# Patient Record
Sex: Female | Born: 2000
Health system: Southern US, Community
[De-identification: ages and names within clinical notes are randomized; demographics above are authoritative.]

## PROBLEM LIST (undated history)

## (undated) ENCOUNTER — Inpatient Hospital Stay (HOSPITAL_COMMUNITY): Payer: Self-pay

## (undated) DIAGNOSIS — Z6791 Unspecified blood type, Rh negative: Secondary | ICD-10-CM

## (undated) DIAGNOSIS — O24419 Gestational diabetes mellitus in pregnancy, unspecified control: Secondary | ICD-10-CM

## (undated) DIAGNOSIS — I2699 Other pulmonary embolism without acute cor pulmonale: Secondary | ICD-10-CM

## (undated) DIAGNOSIS — O133 Gestational [pregnancy-induced] hypertension without significant proteinuria, third trimester: Secondary | ICD-10-CM

## (undated) DIAGNOSIS — O139 Gestational [pregnancy-induced] hypertension without significant proteinuria, unspecified trimester: Secondary | ICD-10-CM

## (undated) DIAGNOSIS — D696 Thrombocytopenia, unspecified: Secondary | ICD-10-CM

## (undated) DIAGNOSIS — O99820 Streptococcus B carrier state complicating pregnancy: Secondary | ICD-10-CM

## (undated) DIAGNOSIS — I517 Cardiomegaly: Secondary | ICD-10-CM

## (undated) HISTORY — PX: KELOID EXCISION: SHX1856

## (undated) HISTORY — DX: Cardiomegaly: I51.7

---
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
## 2018-01-11 ENCOUNTER — Encounter: Payer: Self-pay | Admitting: Hematology and Oncology

## 2018-01-11 ENCOUNTER — Inpatient Hospital Stay: Payer: Medicaid Other | Attending: Hematology and Oncology | Admitting: Hematology and Oncology

## 2018-01-11 ENCOUNTER — Inpatient Hospital Stay: Payer: Medicaid Other

## 2018-01-11 ENCOUNTER — Telehealth: Payer: Self-pay | Admitting: Hematology and Oncology

## 2018-01-11 DIAGNOSIS — Z79899 Other long term (current) drug therapy: Secondary | ICD-10-CM | POA: Diagnosis not present

## 2018-01-11 DIAGNOSIS — Z7901 Long term (current) use of anticoagulants: Secondary | ICD-10-CM

## 2018-01-11 DIAGNOSIS — D509 Iron deficiency anemia, unspecified: Secondary | ICD-10-CM | POA: Diagnosis not present

## 2018-01-11 DIAGNOSIS — I2699 Other pulmonary embolism without acute cor pulmonale: Secondary | ICD-10-CM | POA: Diagnosis not present

## 2018-01-11 DIAGNOSIS — N920 Excessive and frequent menstruation with regular cycle: Secondary | ICD-10-CM

## 2018-01-11 LAB — CBC WITH DIFFERENTIAL (CANCER CENTER ONLY)
BASOS PCT: 1 %
Basophils Absolute: 0.1 10*3/uL (ref 0.0–0.1)
EOS ABS: 0.2 10*3/uL (ref 0.0–0.5)
Eosinophils Relative: 2 %
HEMATOCRIT: 34.8 % (ref 34.8–46.6)
Hemoglobin: 11.4 g/dL — ABNORMAL LOW (ref 11.6–15.9)
Lymphocytes Relative: 24 %
Lymphs Abs: 2.3 10*3/uL (ref 0.9–3.3)
MCH: 27.7 pg (ref 25.1–34.0)
MCHC: 32.7 g/dL (ref 31.5–36.0)
MCV: 84.7 fL (ref 79.5–101.0)
MONO ABS: 0.6 10*3/uL (ref 0.1–0.9)
MONOS PCT: 6 %
NEUTROS ABS: 6.5 10*3/uL (ref 1.5–6.5)
Neutrophils Relative %: 67 %
Platelet Count: 298 10*3/uL (ref 145–400)
RBC: 4.11 MIL/uL (ref 3.70–5.45)
RDW: 15.2 % — ABNORMAL HIGH (ref 11.2–14.5)
WBC Count: 9.5 10*3/uL (ref 3.9–10.3)

## 2018-01-11 LAB — IRON AND TIBC
Iron: 43 ug/dL (ref 41–142)
Saturation Ratios: 12 % — ABNORMAL LOW (ref 21–57)
TIBC: 363 ug/dL (ref 236–444)
UIBC: 320 ug/dL

## 2018-01-11 LAB — CMP (CANCER CENTER ONLY)
ALBUMIN: 3.7 g/dL (ref 3.5–5.0)
ALK PHOS: 73 U/L (ref 40–150)
ALT: 19 U/L (ref 0–55)
AST: 18 U/L (ref 5–34)
Anion gap: 8 (ref 3–11)
BUN: 12 mg/dL (ref 7–26)
CALCIUM: 9.2 mg/dL (ref 8.4–10.4)
CO2: 26 mmol/L (ref 22–29)
CREATININE: 0.82 mg/dL (ref 0.60–1.10)
Chloride: 106 mmol/L (ref 98–109)
GFR, Est AFR Am: >60 mL/min (ref >60)
GFR, Estimated: >60 mL/min (ref >60)
GLUCOSE: 80 mg/dL (ref 70–140)
Potassium: 4.3 mmol/L (ref 3.5–5.1)
SODIUM: 140 mmol/L (ref 136–145)
Total Bilirubin: <0.2 mg/dL — ABNORMAL LOW (ref 0.2–1.2)
Total Protein: 7.9 g/dL (ref 6.4–8.3)

## 2018-01-11 LAB — FERRITIN: Ferritin: 13 ng/mL (ref 9–269)

## 2018-01-11 NOTE — Telephone Encounter (Signed)
Scheduled appt per 2/6 los - gave patient aVS and calender per los.

## 2018-01-27 ENCOUNTER — Emergency Department (HOSPITAL_COMMUNITY): Payer: Medicaid Other

## 2018-01-27 ENCOUNTER — Encounter (HOSPITAL_COMMUNITY): Payer: Self-pay | Admitting: Emergency Medicine

## 2018-01-27 DIAGNOSIS — Z86711 Personal history of pulmonary embolism: Secondary | ICD-10-CM | POA: Diagnosis not present

## 2018-01-27 DIAGNOSIS — R1084 Generalized abdominal pain: Secondary | ICD-10-CM | POA: Diagnosis not present

## 2018-01-27 DIAGNOSIS — Z7901 Long term (current) use of anticoagulants: Secondary | ICD-10-CM | POA: Insufficient documentation

## 2018-01-27 DIAGNOSIS — R0789 Other chest pain: Secondary | ICD-10-CM | POA: Insufficient documentation

## 2018-01-27 LAB — COMPREHENSIVE METABOLIC PANEL
ALT: 15 U/L (ref 14–54)
AST: 20 U/L (ref 15–41)
Albumin: 3.9 g/dL (ref 3.5–5.0)
Alkaline Phosphatase: 66 U/L (ref 38–126)
Anion gap: 10 (ref 5–15)
BUN: 14 mg/dL (ref 6–20)
CO2: 25 mmol/L (ref 22–32)
Calcium: 9.1 mg/dL (ref 8.9–10.3)
Chloride: 104 mmol/L (ref 101–111)
Creatinine, Ser: 0.87 mg/dL (ref 0.44–1.00)
GFR calc Af Amer: >60 mL/min (ref >60)
GFR calc non Af Amer: >60 mL/min (ref >60)
Glucose, Bld: 83 mg/dL (ref 65–99)
Potassium: 3.7 mmol/L (ref 3.5–5.1)
Sodium: 139 mmol/L (ref 135–145)
Total Bilirubin: 0.5 mg/dL (ref 0.3–1.2)
Total Protein: 7.9 g/dL (ref 6.5–8.1)

## 2018-01-27 LAB — I-STAT BETA HCG BLOOD, ED (MC, WL, AP ONLY): I-stat hCG, quantitative: <5 m[IU]/mL (ref <5)

## 2018-01-27 LAB — CBC
HCT: 35.1 % — ABNORMAL LOW (ref 36.0–46.0)
Hemoglobin: 11.5 g/dL — ABNORMAL LOW (ref 12.0–15.0)
MCH: 27.9 pg (ref 26.0–34.0)
MCHC: 32.8 g/dL (ref 30.0–36.0)
MCV: 85.2 fL (ref 78.0–100.0)
Platelets: 314 10*3/uL (ref 150–400)
RBC: 4.12 MIL/uL (ref 3.87–5.11)
RDW: 14.9 % (ref 11.5–15.5)
WBC: 11.9 10*3/uL — ABNORMAL HIGH (ref 4.0–10.5)

## 2018-01-27 LAB — I-STAT TROPONIN, ED: Troponin i, poc: 0 ng/mL (ref 0.00–0.08)

## 2018-01-27 LAB — LIPASE, BLOOD: LIPASE: 29 U/L (ref 11–51)

## 2018-01-27 NOTE — ED Triage Notes (Signed)
Patient presents ambulatory stating a few weeks ago she was throwing up with hot flashes and back pain. States shes had intermittent abdominal pain/bloating for a week now associated with diarrhea and rectal bleeding. Patient states she has left sided chest pain x 3 days ago.

## 2018-01-28 ENCOUNTER — Emergency Department (HOSPITAL_COMMUNITY)
Admission: EM | Admit: 2018-01-28 | Discharge: 2018-01-28 | Disposition: A | Discharge status: Home / Self Care | Payer: Medicaid Other | Attending: Emergency Medicine | Admitting: Emergency Medicine

## 2018-01-28 DIAGNOSIS — R1084 Generalized abdominal pain: Secondary | ICD-10-CM

## 2018-01-28 DIAGNOSIS — R0789 Other chest pain: Secondary | ICD-10-CM

## 2018-01-28 LAB — URINALYSIS, ROUTINE W REFLEX MICROSCOPIC
Bilirubin Urine: NEGATIVE
Glucose, UA: NEGATIVE mg/dL
HGB URINE DIPSTICK: NEGATIVE
Ketones, ur: 5 mg/dL — AB
LEUKOCYTES UA: NEGATIVE
NITRITE: NEGATIVE
PROTEIN: NEGATIVE mg/dL
SPECIFIC GRAVITY, URINE: 1.033 — AB (ref 1.005–1.030)
pH: 5 (ref 5.0–8.0)

## 2018-01-28 LAB — POC OCCULT BLOOD, ED: FECAL OCCULT BLD: NEGATIVE

## 2018-01-28 MED ORDER — DICYCLOMINE HCL 10 MG PO CAPS
20.0000 mg | ORAL_CAPSULE | Freq: Once | ORAL | Status: AC
  Administered 2018-01-28: 20 mg via ORAL
  Filled 2018-01-28: qty 2

## 2018-01-28 MED ORDER — DICYCLOMINE HCL 20 MG PO TABS: 20.0000 mg | ORAL_TABLET | Freq: Three times a day (TID) | ORAL | 0 refills | Status: DC

## 2018-01-28 MED ORDER — ONDANSETRON 4 MG PO TBDP: 4.0000 mg | ORAL_TABLET | Freq: Four times a day (QID) | ORAL | 0 refills | Status: DC | PRN

## 2018-01-28 NOTE — ED Provider Notes (Signed)
TIME SEEN: 2:20 AM  CHIEF COMPLAINT: Multiple complaints  HPI: Patient is a [age] year old female who presents to the emergency department with multiple complaints.  Patient has a history of PE and is on Xarelto.  Thought to be secondary to her birth control which she is no longer taking.  Comes in today complaining of 3 days of left-sided chest pain.  Described as dull in nature and does not feel like her previous PE.  Now completely gone.  No shortness of breath.  Also complaining of diffuse abdominal pain that "moves around" for a week and a half with diarrhea that occurs every morning with some bright red blood per rectum.  No melena.  Has had nausea and one episode of vomiting.  No fevers, chills, cough.  No previous abdominal surgery.  No sick contact.  No recent travel.  Was hospitalized in December for pneumonia.  Other than that no recent hospitalization or antibiotic use.  ROS: See HPI Constitutional: no fever  Eyes: no drainage  ENT: no runny nose   Cardiovascular:  no chest pain  Resp: no SOB  GI: no vomiting GU: no dysuria Integumentary: no rash  Allergy: no hives  Musculoskeletal: no leg swelling  Neurological: no slurred speech ROS otherwise negative  PAST MEDICAL HISTORY/PAST SURGICAL HISTORY:  Past Medical History:  Diagnosis Date  . Pulmonary embolism (HCC)    a. Dx 09/2017, had IUD removed in case this contributed.  . Right ventricular dilation    a. at time of PE 09/2017.    MEDICATIONS:  Prior to Admission medications   Medication Sig Start Date End Date Taking? Authorizing Provider  rivaroxaban (XARELTO) 20 MG TABS tablet Take 1 tablet (20 mg total) by mouth daily with supper. Start after the initial starter pack is completed 09/19/17  Yes Rai, Ripudeep K, MD  docusate sodium (COLACE) 100 MG capsule Take 1 capsule (100 mg total) by mouth daily. Patient not taking: Reported on 12/18/2017 11/10/17 11/10/18  Arrien, York RamMauricio Daniel, MD  ferrous sulfate (FERROUSUL) 325  (65 FE) MG tablet Take 1 tablet (325 mg total) by mouth 3 (three) times daily with meals. Patient not taking: Reported on 12/18/2017 11/10/17 12/10/17  Arrien, York RamMauricio Daniel, MD    ALLERGIES:  No Known Allergies  SOCIAL HISTORY:  Social History   Tobacco Use  . Smoking status: Never Smoker  . Smokeless tobacco: Never Used  Substance Use Topics  . Alcohol use: No    FAMILY HISTORY: Family History  Problem Relation Age of Onset  . Hypertension Mother   . Hypertension Maternal Aunt   . Cancer Maternal Grandfather   . Pulmonary embolism Neg Hx   . Heart disease Neg Hx     EXAM: BP 132/80 (BP Location: Right Arm)   Pulse 92   Temp 98.4 F (36.9 C) (Oral)   Resp 14   Ht 5\' 4"  (1.626 m)   Wt 83.9 kg (185 lb)   LMP 12/18/2017   SpO2 92%   BMI 31.76 kg/m  CONSTITUTIONAL: Alert and oriented and responds appropriately to questions. Well-appearing; well-nourished HEAD: Normocephalic EYES: Conjunctivae clear, pupils appear equal, EOMI ENT: normal nose; moist mucous membranes NECK: Supple, no meningismus, no nuchal rigidity, no LAD  CARD: RRR; S1 and S2 appreciated; no murmurs, no clicks, no rubs, no gallops RESP: Normal chest excursion without splinting or tachypnea; breath sounds clear and equal bilaterally; no wheezes, no rhonchi, no rales, no hypoxia or respiratory distress, speaking full sentences ABD/GI: Normal bowel sounds; non-distended;  soft, non-tender, no rebound, no guarding, no peritoneal signs, no hepatosplenomegaly RECTAL:  Normal rectal tone, no gross blood or melena, guaiac negative, no hemorrhoids appreciated, nontender rectal exam, no fecal impaction BACK:  The back appears normal and is non-tender to palpation, there is no CVA tenderness EXT: Normal ROM in all joints; non-tender to palpation; no edema; normal capillary refill; no cyanosis, no calf tenderness or swelling    SKIN: Normal color for age and race; warm; no rash NEURO: Moves all extremities  equally PSYCH: The patient's mood and manner are appropriate. Grooming and personal hygiene are appropriate.  MEDICAL DECISION MAKING: Patient here with complaints of atypical chest pain.  Doubt PE, ACS or dissection.  Now currently having no chest pain.  Troponin obtained in triage is negative.  EKG shows no ischemic abnormality, arrhythmia or interval abnormality.  Chest x-ray is clear.  Reports compliance with her Xarelto.  Also complaining of abdominal pain.  Her abdominal exam is benign.  Requesting something for pain.  Will give Bentyl.  Normal labs including no significant leukocytosis, normal LFTs and lipase, normal renal function.  Pregnancy test is negative.  Urine shows no sign of infection.  Doubt appendicitis, colitis, bowel obstruction, diverticulitis, pancreatitis, cholecystitis.  I do not feel she needs abdominal imaging.  She is guaiac negative today.  I do not feel patient needs further emergent workup.  I feel she can be treated as an outpatient and follow-up with her primary care physician.   At this time, I do not feel there is any life-threatening condition present. I have reviewed and discussed all results (EKG, imaging, lab, urine as appropriate) and exam findings with patient/family. I have reviewed nursing notes and appropriate previous records.  I feel the patient is safe to be discharged home without further emergent workup and can continue workup as an outpatient as needed. Discussed usual and customary return precautions. Patient/family verbalize understanding and are comfortable with this plan.  Outpatient follow-up has been provided if needed. All questions have been answered.      EKG Interpretation  Date/Time:  Friday January 27 2018 22:04:38 EST Ventricular Rate:  99 PR Interval:    QRS Duration: 92 QT Interval:  328 QTC Calculation: 421 R Axis:   86 Text Interpretation:  Sinus rhythm No significant change since last tracing other than rate is slower compared  to previous  Confirmed by Nyeem Stoke, Baxter Hire [phone]) on 01/28/2018 1:11:15 AM         Larrie Lucia, Layla Maw, DO 01/28/18 6045

## 2018-01-28 NOTE — Discharge Instructions (Signed)
Your labs, urine, chest x-ray, EKG today were normal.  Please follow-up with your primary care physician if symptoms continue.  You may take over-the-counter Imodium as needed for diarrhea.  Your stool had no blood in it today in the ED.  You may take Tylenol 1000 mg every 6 hours as needed for pain.

## 2018-01-28 NOTE — ED Notes (Signed)
Pt walked to bathroom to give urine specimen.

## 2018-02-05 NOTE — Assessment & Plan Note (Signed)
17 y.o. female with seemingly unprovoked initial event of venous thromboembolism in September-October 2018 with low volume clot, but in the context of troponin leak and possible right ventricular strain.  Subsequent imaging demonstrated resolution of thrombus in the lungs on the current anticoagulation therapy with rivaroxaban.  Patient's symptoms are gradually improving.  Plan: -Additional lab work as outlined below.  Although it is the initial thrombotic event, patient is very healthy and I do believe that thrombophilia workup is indicated at this time to help us determine the recommended duration of anticoagulation. -Unless positive results are found on thrombophilia workup, recommend 6 months of therapeutic anticoagulation to complete in mid April 2019. -Return to my clinic in 3 months with labs and clinic visit.

## 2018-02-05 NOTE — Progress Notes (Signed)
[facility] New Visit:  Assessment: Pulmonary embolism [facility]) 17 y.o. female with seemingly unprovoked initial event of venous thromboembolism in September-October 2018 with low volume clot, but in the context of troponin leak and possible right ventricular strain.  Subsequent imaging demonstrated resolution of thrombus in the lungs on the current anticoagulation therapy with rivaroxaban.  Patient's symptoms are gradually improving.  Plan: -Additional lab work as outlined below.  Although it is the initial thrombotic event, patient is very healthy and I do believe that thrombophilia workup is indicated at this time to help [place] determine the recommended duration of anticoagulation. -Unless positive results are found on thrombophilia workup, recommend 6 months of therapeutic anticoagulation to complete in mid April 2019. -Return to my clinic in 3 months with labs and clinic visit.   Voice recognition software was used and creation of this note. Despite my best effort at editing the text, some misspelling/errors may have occurred.  Orders Placed This Encounter  Procedures  . CBC with Differential (Cancer Center Only)    Standing Status:   Future    Number of Occurrences:   1    Standing Expiration Date:   01/11/2019  . CMP ([facility] only)    Standing Status:   Future    Number of Occurrences:   1    Standing Expiration Date:   01/11/2019  . Iron and TIBC    Standing Status:   Future    Number of Occurrences:   1    Standing Expiration Date:   01/11/2019  . Ferritin    Standing Status:   Future    Number of Occurrences:   1    Standing Expiration Date:   01/11/2019  . CBC with Differential (Cancer Center Only)    Standing Status:   Future    Standing Expiration Date:   01/11/2019  . CMP ([facility] only)    Standing Status:   Future    Standing Expiration Date:   01/11/2019  . Antithrombin III    Standing Status:   Future    Standing Expiration Date:   01/11/2019   . Protein C activity    Standing Status:   Future    Standing Expiration Date:   01/11/2019  . Protein C, total    Standing Status:   Future    Standing Expiration Date:   01/11/2019  . Protein S activity    Standing Status:   Future    Standing Expiration Date:   01/11/2019  . Protein S, total    Standing Status:   Future    Standing Expiration Date:   01/11/2019  . Lupus anticoagulant panel    Standing Status:   Future    Standing Expiration Date:   01/11/2019  . Beta-2-glycoprotein i abs, IgG/M/A    Standing Status:   Future    Standing Expiration Date:   01/11/2019  . Homocysteine, serum    Standing Status:   Future    Standing Expiration Date:   01/11/2019  . Factor 5 leiden    Standing Status:   Future    Standing Expiration Date:   01/11/2019  . Prothrombin gene mutation    Standing Status:   Future    Standing Expiration Date:   01/11/2019  . Cardiolipin antibodies, IgG, IgM, IgA    Standing Status:   Future    Standing Expiration Date:   01/11/2019    All questions were answered.  . The patient knows  to call the clinic with any problems, questions or concerns.  This note was electronically signed.    History of Presenting Illness Alexis Richardson 17 y.o. presenting to the [facility] for history of venous thromboembolism, referred byDr Rapudeep Rai & Dr Troy Sine.  Patient was diagnosed with pulmonary embolism on 09/17/17 as outlined below, but the initial presentation of the symptoms was likely slightly earlier at the end of September considering the presentation of the left chest pain palpitations at that time.  The timeline of that evaluation as outlined below in hematological history.  Following initial diagnosis, patient has been on therapeutic anticoagulation and reports good compliance with therapy.  Prior to the development of the clot, patient was on intrauterine device with levonorgestrel.  She denies any preceding long-range travel, lower extremity injury, period of  immobilization, or recent surgery.  Following initial event, patient had multiple ER visits with various complaints including epigastric pain and chest pain on 09/22/17, left lower extremity pain at the knee and ankle on 10/05/17 at which time she also had a menstrual period which was heavier than normal due to patient being on therapeutic anticoagulation with rivaroxaban.  She again presented to the emergency room with chest pain abdominal pain and recurrent vaginal bleeding 10/17/17, subsequent to presenting on 10/22/17 with cough, chest pain, and low-grade fevers.  Again on 12/, with cough, chills, and progressive fever.  03/18 at that time patient was admitted for 3 days due to a diagnosis of multifocal pneumonia and was also diagnosed with iron deficiency anemia.  At this time, patient is feeling a bit better.  The respiratory symptoms have subsided.  She denies any active chest pain, or shortness of breath.  No active abdominal pain, nausea, vomiting, hematochezia, or melena.  Patient has no significant past medical history.  Patient has no family history of clotting disorders in the family, no history of recurrent spontaneous abortions or difficulty conceiving.  Patient does not smoke, does not drink alcohol, is sexually active with the same partner for the past 2 years, not using barrier protection.  Oncological/hematological History: **VTE, unprovoked --Event #1, 09/03/17: Patient initially presented to the emergency room with pain in the left chest and palpitations.  Was managed conservatively and discharged home, nevertheless, presented on 09/17/14 with shortness of breath following sudden onset of chest pain 2 hours prior with associated chest tightness and pressure worsening with activity and without pleuritic component.  Lab work was positive for mildly elevated troponin  --CTA Chest, 09/17/17: Small nonocclusive pulmonary embolism in the right upper lobe, no right ventricular strain.  --ECHO,  10/normal left ventricular ejection fraction of 55-60%.13/18: Right ventricle with mild dilation.  --Doppler US BL LE, 09/18/17: Negative for deep vein thrombosis.  --Treatment:    --Heparin gtt   --Rivaroxaban, 09/20/17- --Labs, 10/05/17: WBC   9.3, Hgb 12.2, MCV 86.2, MCH 28.6, MCHC 33.2, RDW 14.3, Plt 294; --CTA Chest, 11/07/17: Interval resolution of the pulmonary embolus. --Labs, 11/09/17: WBC 12.9, Hgb 10.6, MCV 85.0, MCH 28.0, MCHC 32.9, RDW 13.5, Plt 297; Fe 14, FeSat 4%, TIBC 332, Ferritin 57 --ECHO, 11/23/17: Left ventricular ejection fraction 65-70%, right ventricle normal cavity size, IVC of normal caliber.  Medical History: Past Medical History:  Diagnosis Date  . Pulmonary embolism ([place])    a. Dx 09/2017, had IUD removed in case this contributed.  . Right ventricular dilation    a. at time of PE 09/2017.    Surgical History: No past surgical history on file.  Family History: Family History  Problem Relation Age of Onset  . Hypertension Mother   . Hypertension Maternal Aunt   . Cancer Maternal Grandfather   . Pulmonary embolism Neg Hx   . Heart disease Neg Hx     Social History: Social History   Socioeconomic History  . Marital status: Single    Spouse name: Not on file  . Number of children: Not on file  . Years of education: Not on file  . Highest education level: Not on file  Social Needs  . Financial resource strain: Not on file  . Food insecurity - worry: Not on file  . Food insecurity - inability: Not on file  . Transportation needs - medical: Not on file  . Transportation needs - non-medical: Not on file  Occupational History  . Not on file  Tobacco Use  . Smoking status: Never Smoker  . Smokeless tobacco: Never Used  Substance and Sexual Activity  . Alcohol use: No  . Drug use: Yes    Types: Marijuana    Comment: last mid-Oct  . Sexual activity: Yes    Birth control/protection: None  Other Topics Concern  . Not on file  Social History  Narrative  . Not on file    Allergies: No Known Allergies  Medications:  Current Outpatient Medications  Medication Sig Dispense Refill  . rivaroxaban (XARELTO) 20 MG TABS tablet Take 1 tablet (20 mg total) by mouth daily with supper. Start after the initial starter pack is completed 30 tablet 4  . dicyclomine (BENTYL) 20 MG tablet Take 1 tablet (20 mg total) by mouth 3 (three) times daily before meals. As needed for abdominal cramping 15 tablet 0  . docusate sodium (COLACE) 100 MG capsule Take 1 capsule (100 mg total) by mouth daily. (Patient not taking: Reported on 12/18/2017) 30 capsule 0  . ferrous sulfate (FERROUSUL) 325 (65 FE) MG tablet Take 1 tablet (325 mg total) by mouth 3 (three) times daily with meals. (Patient not taking: Reported on 12/18/2017) 90 tablet 0  . ondansetron (ZOFRAN ODT) 4 MG disintegrating tablet Take 1 tablet (4 mg total) by mouth every 6 (six) hours as needed for nausea or vomiting. 20 tablet 0   No current facility-administered medications for this visit.     Review of Systems: Review of Systems  All other systems reviewed and are negative.    PHYSICAL EXAMINATION Last menstrual period 12/18/2017.  ECOG PERFORMANCE STATUS: 1 - Symptomatic but completely ambulatory  Physical Exam  Constitutional: She is oriented to person, place, and time and well-developed, well-nourished, and in no distress. No distress.  HENT:  Head: Normocephalic and atraumatic.  Mouth/Throat: Oropharynx is clear and moist. No oropharyngeal exudate.  Eyes: Conjunctivae and EOM are normal. Pupils are equal, round, and reactive to light. No scleral icterus.  Neck: No thyromegaly present.  Cardiovascular: Normal rate, regular rhythm and normal heart sounds.  No murmur heard. Pulmonary/Chest: Effort normal and breath sounds normal. No respiratory distress. She has no wheezes. She has no rales.  Abdominal: Soft. Bowel sounds are normal. She exhibits no distension and no mass. There  is no tenderness. There is no guarding.  Musculoskeletal: She exhibits no edema.  Lymphadenopathy:    She has no cervical adenopathy.  Neurological: She is alert and oriented to person, place, and time. She has normal reflexes. No cranial nerve deficit. Coordination normal.  Skin: Skin is warm and dry. No rash noted. She is not diaphoretic. No erythema. No pallor.  LABORATORY DATA: I have personally reviewed the data as listed: Appointment on 01/11/2018  Component Date Value Ref Range Status  . Ferritin 01/11/2018 13  9 - 269 ng/mL Final   Performed at [facility], [place] [address] [place], [place] [address]  . Iron 01/11/2018 43  41 - 142 ug/dL Final  . TIBC 01/11/2018 363  236 - 444 ug/dL Final  . Saturation Ratios 01/11/2018 12* 21 - 57 % Final  . UIBC 01/11/2018 320  ug/dL Final   Performed at [facility], [place] [address], [place], [place] [address]  . Sodium 01/11/2018 140  136 - 145 mmol/L Final  . Potassium 01/11/2018 4.3  3.5 - 5.1 mmol/L Final  . Chloride 01/11/2018 106  98 - 109 mmol/L Final  . CO2 01/11/2018 26  22 - 29 mmol/L Final  . Glucose, Bld 01/11/2018 80  70 - 140 mg/dL Final  . BUN 01/11/2018 12  7 - 26 mg/dL Final  . Creatinine 01/11/2018 0.82  0.60 - 1.10 mg/dL Final  . Calcium 01/11/2018 9.2  8.4 - 10.4 mg/dL Final  . Total Protein 01/11/2018 7.9  6.4 - 8.3 g/dL Final  . Albumin 01/11/2018 3.7  3.5 - 5.0 g/dL Final  . AST 01/11/2018 18  5 - 34 U/L Final  . ALT 01/11/2018 19  0 - 55 U/L Final  . Alkaline Phosphatase 01/11/2018 73  40 - 150 U/L Final  . Total Bilirubin 01/11/2018 <0.2* 0.2 - 1.2 mg/dL Final  . GFR, Est Non Af Am 01/11/2018 >60  >60 mL/min Final  . GFR, Est AFR Am 01/11/2018 >60  >60 mL/min Final   Comment: (NOTE) The eGFR has been calculated using the CKD EPI equation. This calculation has not been validated in all clinical situations. eGFR's persistently <60 mL/min signify possible  Chronic Kidney Disease.   Georgiann Hahn gap 01/11/2018 8  3 - 11 Final   Performed at [facility], [place] [address], [place], [place] [address]  . WBC Count 01/11/2018 9.5  3.9 - 10.3 K/uL Final  . RBC 01/11/2018 4.11  3.70 - 5.45 MIL/uL Final  . Hemoglobin 01/11/2018 11.4* 11.6 - 15.9 g/dL Final  . HCT 01/11/2018 34.8  34.8 - 46.6 % Final  . MCV 01/11/2018 84.7  79.5 - 101.0 fL Final  . MCH 01/11/2018 27.7  25.1 - 34.0 pg Final  . MCHC 01/11/2018 32.7  31.5 - 36.0 g/dL Final  . RDW 01/11/2018 15.2* 11.2 - 14.5 % Final  . Platelet Count 01/11/2018 298  145 - 400 K/uL Final  . Neutrophils Relative % 01/11/2018 67  % Final  . Neutro Abs 01/11/2018 6.5  1.5 - 6.5 K/uL Final  . Lymphocytes Relative 01/11/2018 24  % Final  . Lymphs Abs 01/11/2018 2.3  0.9 - 3.3 K/uL Final  . Monocytes Relative 01/11/2018 6  % Final  . Monocytes Absolute 01/11/2018 0.6  0.1 - 0.9 K/uL Final  . Eosinophils Relative 01/11/2018 2  % Final  . Eosinophils Absolute 01/11/2018 0.2  0.0 - 0.5 K/uL Final  . Basophils Relative 01/11/2018 1  % Final  . Basophils Absolute 01/11/2018 0.1  0.0 - 0.1 K/uL Final   Performed at [facility], [place] [address], [place], [place] [address]         Ardath Sax, MD

## 2018-03-27 ENCOUNTER — Encounter: Payer: Self-pay | Admitting: Podiatry

## 2018-03-27 ENCOUNTER — Ambulatory Visit (INDEPENDENT_AMBULATORY_CARE_PROVIDER_SITE_OTHER): Payer: Medicaid Other | Admitting: Podiatry

## 2018-03-27 VITALS — BP 123/83 | HR 79 | Resp 16

## 2018-03-27 DIAGNOSIS — M21622 Bunionette of left foot: Secondary | ICD-10-CM | POA: Diagnosis not present

## 2018-03-27 DIAGNOSIS — M7752 Other enthesopathy of left foot: Secondary | ICD-10-CM

## 2018-03-27 DIAGNOSIS — M779 Enthesopathy, unspecified: Secondary | ICD-10-CM

## 2018-03-27 DIAGNOSIS — L84 Corns and callosities: Secondary | ICD-10-CM

## 2018-03-27 MED ORDER — TRIAMCINOLONE ACETONIDE 10 MG/ML IJ SUSP
10.0000 mg | Freq: Once | INTRAMUSCULAR | Status: AC
  Administered 2018-03-27: 10 mg

## 2018-03-28 NOTE — Progress Notes (Signed)
Subjective:   Patient ID: Alexis NordmannAmy Richardson, female   DOB: 17 y.o.   MRN: [id number]   HPI Patient presents with x-rays stating she has this painful callus on the fifth metatarsal left foot that makes it hard to walk and is been present for around 6 months.  Patient does not smoke likes to be active   Review of Systems  All other systems reviewed and are negative.       Objective:  Physical Exam  Constitutional: She appears well-developed and well-nourished.  Cardiovascular: Intact distal pulses.  Pulmonary/Chest: Effort normal.  Musculoskeletal: Normal range of motion.  Neurological: She is alert.  Skin: Skin is warm.  Nursing note and vitals reviewed.   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  Today I did a careful capsular injection of the subcapsular area to reduce inflammation under sterile technique debrided the lesion and will see how much relief this gives her

## 2018-04-12 ENCOUNTER — Ambulatory Visit: Payer: Medicaid Other | Admitting: Cardiovascular Disease

## 2018-04-12 ENCOUNTER — Encounter: Payer: Self-pay | Admitting: Cardiovascular Disease

## 2018-04-12 ENCOUNTER — Inpatient Hospital Stay: Payer: Medicaid Other | Attending: Hematology and Oncology

## 2018-04-12 VITALS — BP 114/81 | HR 75 | Ht 63.5 in | Wt 202.0 lb

## 2018-04-12 DIAGNOSIS — Z920 Personal history of contraception: Secondary | ICD-10-CM | POA: Diagnosis not present

## 2018-04-12 DIAGNOSIS — Z86711 Personal history of pulmonary embolism: Secondary | ICD-10-CM | POA: Insufficient documentation

## 2018-04-12 DIAGNOSIS — Z7901 Long term (current) use of anticoagulants: Secondary | ICD-10-CM | POA: Diagnosis not present

## 2018-04-12 DIAGNOSIS — D509 Iron deficiency anemia, unspecified: Secondary | ICD-10-CM | POA: Diagnosis not present

## 2018-04-12 DIAGNOSIS — R778 Other specified abnormalities of plasma proteins: Secondary | ICD-10-CM

## 2018-04-12 DIAGNOSIS — R7989 Other specified abnormal findings of blood chemistry: Secondary | ICD-10-CM

## 2018-04-12 DIAGNOSIS — I2699 Other pulmonary embolism without acute cor pulmonale: Secondary | ICD-10-CM

## 2018-04-12 DIAGNOSIS — R748 Abnormal levels of other serum enzymes: Secondary | ICD-10-CM

## 2018-04-12 LAB — CBC WITH DIFFERENTIAL (CANCER CENTER ONLY)
BASOS ABS: 0 10*3/uL (ref 0.0–0.1)
Basophils Relative: 0 %
EOS ABS: 0.1 10*3/uL (ref 0.0–0.5)
Eosinophils Relative: 1 %
HCT: 35.6 % (ref 34.8–46.6)
Hemoglobin: 11.7 g/dL (ref 11.6–15.9)
LYMPHS ABS: 2.9 10*3/uL (ref 0.9–3.3)
LYMPHS PCT: 32 %
MCH: 27.5 pg (ref 25.1–34.0)
MCHC: 32.8 g/dL (ref 31.5–36.0)
MCV: 83.7 fL (ref 79.5–101.0)
Monocytes Absolute: 0.5 10*3/uL (ref 0.1–0.9)
Monocytes Relative: 5 %
NEUTROS PCT: 62 %
Neutro Abs: 5.5 10*3/uL (ref 1.5–6.5)
PLATELETS: 243 10*3/uL (ref 145–400)
RBC: 4.25 MIL/uL (ref 3.70–5.45)
RDW: 15.5 % — ABNORMAL HIGH (ref 11.2–14.5)
WBC: 9 10*3/uL (ref 3.9–10.3)

## 2018-04-12 LAB — CMP (CANCER CENTER ONLY)
ALT: 12 U/L (ref 0–55)
AST: 15 U/L (ref 5–34)
Albumin: 3.9 g/dL (ref 3.5–5.0)
Alkaline Phosphatase: 69 U/L (ref 40–150)
Anion gap: 8 (ref 3–11)
BUN: 12 mg/dL (ref 7–26)
CHLORIDE: 107 mmol/L (ref 98–109)
CO2: 26 mmol/L (ref 22–29)
CREATININE: 0.93 mg/dL (ref 0.60–1.10)
Calcium: 9.6 mg/dL (ref 8.4–10.4)
Glucose, Bld: 75 mg/dL (ref 70–140)
Potassium: 3.6 mmol/L (ref 3.5–5.1)
Sodium: 141 mmol/L (ref 136–145)
Total Bilirubin: 0.3 mg/dL (ref 0.2–1.2)
Total Protein: 7.8 g/dL (ref 6.4–8.3)

## 2018-04-12 NOTE — Patient Instructions (Signed)
Medication Instructions:  Your physician recommends that you continue on your current medications as directed. Please refer to the Current Medication list given to you today.  Follow-Up: As needed with Dr. Kelly  Any Other Special Instructions Will Be Listed Below (If Applicable).     If you need a refill on your cardiac medications before your next appointment, please call your pharmacy.   

## 2018-04-12 NOTE — Progress Notes (Signed)
Cardiology Office Note    Date:  04/14/2018   ID:  Alexis Richardson, DOB March 11, 2001, MRN [id number]  PCP:  Macie Burows, MD  Cardiologist:  Shelva Majestic, MD   Initial office evaluation  History of Present Illness:  Alexis Richardson is a 17 y.o. female who was admitted to [facility] in October 2018 with an unprovoked venous thromboembolism and associated mild troponin elevation.  Alexis Richardson presented to [facility] in October 2018 after developing acute chest pain associated with shortness of breath.  A CT angiogram showed small nonocclusive pulmonary embolus within segmental/subsegmental branches of her right upper lobe. She had an IUD in place there was concern perhaps that this may have contributed to the development of the PE.  He was treated with Xarelto 15 mg twice a day for 3 weeks and then transition to 20 mg daily.  Initial echo Doppler study showed an EF of 55 to 60% without wall motion abnormalities.  RV was mildly dilated with mildly reduced RV function and there was trivial TR and PR.  Mirena IUD was removed.  She has had recurrent hospital evaluations subsequent to this.  An echo Doppler study in December 2018 showed an EF of 65 to 70% and was normal.  There was no evidence of RV strain.  Subsequently, she has been evaluated by Dr. Lebron Conners of  hematology and oncology.  Laboratory was sent as part of a thrombophilia work-up including antithrombin III, protein C, protein S, lupus anticoagulant, assisting, factor V Leiden, IgG/M/A, prothrombin gene mutation, and cardiolipin antibodies of IgG, IgM, IgA.  She was advised to continue treatment for 6 months unless significant abnormality is detected.  Heart perspective, she denies any recurrent chest pain.  She denies PND orthopnea.  She denies dyspnea.  She presents for evaluation.  Past Medical History:  Diagnosis Date  . Pulmonary embolism ([place])    a. Dx 09/2017, had IUD removed in case this contributed.  . Right ventricular  dilation    a. at time of PE 09/2017.    No past surgical history on file.  Current Medications: No outpatient medications prior to visit.   No facility-administered medications prior to visit.      Allergies:   Patient has no known allergies.   Social History   Socioeconomic History  . Marital status: Single    Spouse name: Not on file  . Number of children: Not on file  . Years of education: Not on file  . Highest education level: Not on file  Occupational History  . Not on file  Social Needs  . Financial resource strain: Not on file  . Food insecurity:    Worry: Not on file    Inability: Not on file  . Transportation needs:    Medical: Not on file    Non-medical: Not on file  Tobacco Use  . Smoking status: Never Smoker  . Smokeless tobacco: Never Used  Substance and Sexual Activity  . Alcohol use: No  . Drug use: Yes    Types: Marijuana    Comment: last mid-Oct  . Sexual activity: Yes    Birth control/protection: None  Lifestyle  . Physical activity:    Days per week: Not on file    Minutes per session: Not on file  . Stress: Not on file  Relationships  . Social connections:    Talks on phone: Not on file    Gets together: Not on file    Attends religious service: Not on  file    Active member of club or organization: Not on file    Attends meetings of clubs or organizations: Not on file    Relationship status: Not on file  Other Topics Concern  . Not on file  Social History Narrative  . Not on file     Family History:  The patient's family history includes Cancer in her maternal grandfather; Hypertension in her maternal aunt and mother.   ROS General: Negative; No fevers, chills, or night sweats;  HEENT: Negative; No changes in vision or hearing, sinus congestion, difficulty swallowing Pulmonary: Negative; No cough, wheezing, shortness of breath, hemoptysis Cardiovascular: See HPI GI: Negative; No nausea, vomiting, diarrhea, or abdominal  pain GU: Negative; No dysuria, hematuria, or difficulty voiding Musculoskeletal: Negative; no myalgias, joint pain, or weakness Hematologic/Oncology: Negative; no easy bruising, bleeding Endocrine: Negative; no heat/cold intolerance; no diabetes Neuro: Negative; no changes in balance, headaches Skin: Negative; No rashes or skin lesions Psychiatric: Negative; No behavioral problems, depression Sleep: Negative; No snoring, daytime sleepiness, hypersomnolence, bruxism, restless legs, hypnogognic hallucinations, no cataplexy Other comprehensive 14 point system review is negative.   PHYSICAL EXAM:   VS:  BP 114/81   Pulse 75   Ht 5' 3.5" (1.613 m)   Wt 202 lb (91.6 kg)   BMI 35.22 kg/m    Wt Readings from Last 3 Encounters:  04/12/18 202 lb (91.6 kg) (98 %, Z= 1.99)*  01/27/18 185 lb (83.9 kg) (96 %, Z= 1.75)*  12/18/17 195 lb (88.5 kg) (97 %, Z= 1.90)*   * Growth percentiles are based on CDC (Girls, 2-20 Years) data.    General: Alert, oriented, no distress.  Skin: normal turgor, no rashes, warm and dry HEENT: Normocephalic, atraumatic. Pupils equal round and reactive to light; sclera anicteric; extraocular muscles intact; Fundi normal, without hemorrhages or exudates. Nose without nasal septal hypertrophy Mouth/Parynx benign; Mallinpatti scale 2 Neck: No JVD, no carotid bruits; normal carotid upstroke Lungs: clear to ausculatation and percussion; no wheezing or rales Chest wall: without tenderness to palpitation Heart: PMI not displaced, RRR, s1 s2 normal, trivial 1/6 systolic murmur, no diastolic murmur, no rubs, gallops, thrills, or heaves Abdomen: soft, nontender; no hepatosplenomehaly, BS+; abdominal aorta nontender and not dilated by palpation. Back: no CVA tenderness Pulses 2+ Musculoskeletal: full range of motion, normal strength, no joint deformities Extremities: no clubbing cyanosis or edema, Homan's sign negative  Neurologic: grossly nonfocal; Cranial nerves grossly  wnl Psychologic: Normal mood and affect   Studies/Labs Reviewed:   EKG:  EKG  ordered today.  ECG (independently read by me): Sinus rhythm with mild sinus arrhythmia.  Ventricular rate 75 bpm.  Normal intervals.  No ST segment changes.  Recent Labs: BMP Latest Ref Rng & Units 04/12/2018 01/27/2018 01/11/2018  Glucose 70 - 140 mg/dL 75 83 80  BUN 7 - 26 mg/dL '12 14 12  ' Creatinine 0.60 - 1.10 mg/dL 0.93 0.87 0.82  Sodium 136 - 145 mmol/L 141 139 140  Potassium 3.5 - 5.1 mmol/L 3.6 3.7 4.3  Chloride 98 - 109 mmol/L 107 104 106  CO2 22 - 29 mmol/L '26 25 26  ' Calcium 8.4 - 10.4 mg/dL 9.6 9.1 9.2     Hepatic Function Latest Ref Rng & Units 04/12/2018 01/27/2018 01/11/2018  Total Protein 6.4 - 8.3 g/dL 7.8 7.9 7.9  Albumin 3.5 - 5.0 g/dL 3.9 3.9 3.7  AST 5 - 34 U/L '15 20 18  ' ALT 0 - 55 U/L '12 15 19  ' Alk Phosphatase 40 -  150 U/L 69 66 73  Total Bilirubin 0.2 - 1.2 mg/dL 0.3 0.5 <0.2(L)  Bilirubin, Direct 0.1 - 0.5 mg/dL - - -    CBC Latest Ref Rng & Units 04/12/2018 01/27/2018 01/11/2018  WBC 3.9 - 10.3 K/uL 9.0 11.9(H) 9.5  Hemoglobin 11.6 - 15.9 g/dL 11.7 11.5(L) 11.4(L)  Hematocrit 34.8 - 46.6 % 35.6 35.1(L) 34.8  Platelets 145 - 400 K/uL 243 314 298   Lab Results  Component Value Date   MCV 83.7 04/12/2018   MCV 85.2 01/27/2018   MCV 84.7 01/11/2018   No results found for: TSH No results found for: HGBA1C   BNP No results found for: BNP  ProBNP No results found for: PROBNP   Lipid Panel  No results found for: CHOL, TRIG, HDL, CHOLHDL, VLDL, LDLCALC, LDLDIRECT   RADIOLOGY: No results found.   Additional studies/ records that were reviewed today include:  Reviewed her original October 2018 evaluation, subsequent ER evaluations, and office records.  Laboratory was reviewed.    ASSESSMENT:    1. History of pulmonary embolus (PE)   2. Elevated troponin   3. History of use of contraceptive intrauterine device (IUD)      PLAN:  Ms. Gurneet Matarese is an 17 year old  female who developed unprovoked initial event of venous thromboembolism in September/October 2018 and had low volume clot but evidence for mild troponin leak and possible mild RV strain.  Adequate imaging has shown complete resolution of thrombus and normalization of RV size and function.  LVEF is 65 to 70%.  He will be completing 6 months of anticoagulation therapy with Xarelto this week and was told to discontinue therapy.  From a cardiac standpoint, she is doing well.  Her chest pain on presentation was secondary to her PE resulting in mild troponin leak probably from mild RV strain.  Her ECG today is stable with sinus rhythm with mild sinus arrhythmia.  Cardiac standpoint she is doing well.  I concur with the discontinuance of Xarelto and she will be following up with hematology.  I will be available on an as-needed basis if cardiacproblems arise.   Medication Adjustments/Labs and Tests Ordered: Current medicines are reviewed at length with the patient today.  Concerns regarding medicines are outlined above.  Medication changes, Labs and Tests ordered today are listed in the Patient Instructions below. Patient Instructions  Medication Instructions:  Your physician recommends that you continue on your current medications as directed. Please refer to the Current Medication list given to you today.  Follow-Up: As needed with Dr. Claiborne Billings  Any Other Special Instructions Will Be Listed Below (If Applicable).     If you need a refill on your cardiac medications before your next appointment, please call your pharmacy.      Signed, Shelva Majestic, MD  04/14/2018 12:38 PM    [facility] [address] [place], [place],   [address] Phone: [phone]

## 2018-04-13 LAB — PROTEIN S ACTIVITY: PROTEIN S ACTIVITY: 64 % (ref 63–140)

## 2018-04-13 LAB — LUPUS ANTICOAGULANT PANEL
DRVVT: 39.3 s (ref 0.0–47.0)
PTT LA: 45.8 s (ref 0.0–51.9)

## 2018-04-13 LAB — PROTEIN C ACTIVITY: Protein C Activity: 125 % (ref 73–180)

## 2018-04-13 LAB — HOMOCYSTEINE: HOMOCYSTEINE-NORM: 8.6 umol/L (ref 0.0–15.0)

## 2018-04-13 LAB — ANTITHROMBIN III: AntiThromb III Func: 106 % (ref 75–120)

## 2018-04-13 LAB — PROTEIN S, TOTAL: Protein S Ag, Total: 67 % (ref 60–150)

## 2018-04-14 ENCOUNTER — Encounter: Payer: Self-pay | Admitting: Cardiovascular Disease

## 2018-04-15 LAB — BETA-2-GLYCOPROTEIN I ABS, IGG/M/A: Beta-2-Glycoprotein I IgA: <9 GPI IgA units (ref 0–25)

## 2018-04-15 LAB — CARDIOLIPIN ANTIBODIES, IGG, IGM, IGA
Anticardiolipin IgG: <9 GPL U/mL (ref 0–14)
Anticardiolipin IgM: 9 MPL U/mL (ref 0–12)

## 2018-04-15 LAB — PROTEIN C, TOTAL: Protein C, Total: 113 % (ref 60–150)

## 2018-04-17 LAB — PROTHROMBIN GENE MUTATION

## 2018-04-17 LAB — FACTOR 5 LEIDEN

## 2018-04-19 ENCOUNTER — Inpatient Hospital Stay (HOSPITAL_BASED_OUTPATIENT_CLINIC_OR_DEPARTMENT_OTHER): Payer: Medicaid Other | Admitting: Hematology and Oncology

## 2018-04-19 ENCOUNTER — Encounter: Payer: Self-pay | Admitting: Hematology and Oncology

## 2018-04-19 ENCOUNTER — Telehealth: Payer: Self-pay | Admitting: Hematology and Oncology

## 2018-04-19 VITALS — BP 143/84 | HR 77 | Temp 98.1°F | Resp 18 | Ht 63.5 in | Wt 206.0 lb

## 2018-04-19 DIAGNOSIS — Z86711 Personal history of pulmonary embolism: Secondary | ICD-10-CM

## 2018-04-19 DIAGNOSIS — D509 Iron deficiency anemia, unspecified: Secondary | ICD-10-CM

## 2018-04-19 DIAGNOSIS — Z7901 Long term (current) use of anticoagulants: Secondary | ICD-10-CM

## 2018-04-19 DIAGNOSIS — I2699 Other pulmonary embolism without acute cor pulmonale: Secondary | ICD-10-CM

## 2018-04-19 NOTE — Telephone Encounter (Signed)
Return if symptoms worsen or fail to improve.per 5/15 los °

## 2018-05-04 NOTE — Assessment & Plan Note (Signed)
17 y.o. female with seemingly unprovoked initial event of venous thromboembolism in Sep-Oct 2018 with low-volume clot, but in the context of troponin leak and possible right ventricular strain. Subsequent imaging demonstrated resolution of thrombus in the lungs on the current anticoagulation therapy with rivaroxaban.  Patient's symptoms are gradually improving.  Additional evaluation reveals no evidence of thrombophilia based on absence of factor V Leiden, prothrombin gene mutation, or antiphospholipid antibody syndrome evidence.  Commendations: -Recommend completing therapeutic anticoagulation after a 94-month course based on the initial unprovoked event. - Return to our clinic as needed, especially if recurrent thrombosis is identified in the future.

## 2018-05-04 NOTE — Progress Notes (Signed)
[facility] Follow-up Visit:  Assessment: History of pulmonary embolus (PE) 17 y.o. female with seemingly unprovoked initial event of venous thromboembolism in Sep-Oct 2018 with low-volume clot, but in the context of troponin leak and possible right ventricular strain. Subsequent imaging demonstrated resolution of thrombus in the lungs on the current anticoagulation therapy with rivaroxaban.  Patient's symptoms are gradually improving.  Additional evaluation reveals no evidence of thrombophilia based on absence of factor V Leiden, prothrombin gene mutation, or antiphospholipid antibody syndrome evidence.  Commendations: -Recommend completing therapeutic anticoagulation after a 74-monthcourse based on the initial unprovoked event. - Return to our clinic as needed, especially if recurrent thrombosis is identified in the future.   Voice recognition software was used and creation of this note. Despite my best effort at editing the text, some misspelling/errors may have occurred.  No orders of the defined types were placed in this encounter.   Cancer Staging No matching staging information was found for the patient.  All questions were answered.  . The patient knows to call the clinic with any problems, questions or concerns.  This note was electronically signed.    History of Presenting Illness Alexis PHoudekis an 17[place].o. [place] of [place] referred by Dr RCharlaine Dalton& Dr TTroy Sine  Patient was diagnosed with pulmonary embolism on 09/17/17 as outlined below, but the initial presentation of the symptoms was likely slightly earlier at the end of September considering the presentation of the left chest pain palpitations at that time.  The timeline of that evaluation as outlined below in hematological history.  Following initial diagnosis, patient has been on therapeutic anticoagulation and reports good  compliance with therapy.  Prior to the development of the clot, patient was on intrauterine device with levonorgestrel.  She denies any preceding long-range travel, lower extremity injury, period of immobilization, or recent surgery.  Following initial event, patient had multiple ER visits with various complaints including epigastric pain and chest pain on 09/22/17, left lower extremity pain at the knee and ankle on 10/05/17 at which time she also had a menstrual period which was heavier than normal due to patient being on therapeutic anticoagulation with rivaroxaban.  She again presented to the emergency room with chest pain abdominal pain and recurrent vaginal bleeding 10/17/17, subsequent to presenting on 10/22/17 with cough, chest pain, and low-grade fevers.  Again on 12/, with cough, chills, and progressive fever.  03/18 at that time patient was admitted for 3 days due to a diagnosis of multifocal pneumonia and was also diagnosed with iron deficiency anemia.  At this time, patient is feeling a bit better.  The respiratory symptoms have subsided.  She denies any active chest pain, or shortness of breath.  No active abdominal pain, nausea, vomiting, hematochezia, or melena.  Patient has no significant past medical history.  Patient has no family history of clotting disorders in the family, no history of recurrent spontaneous abortions or difficulty conceiving.  Patient does not smoke, does not drink alcohol, is sexually active with the same partner for the past 2 years, not using barrier protection.  Oncological/hematological History: **VTE, unprovoked --Event #1, 09/03/17: Patient initially presented to the emergency room with pain in the left chest and palpitations.  Was managed conservatively and discharged home, nevertheless, presented on 09/17/14 with shortness of breath following sudden onset of chest pain 2 hours prior with associated chest tightness and pressure worsening with activity and without  pleuritic component.  Lab work was positive for mildly elevated troponin             --CTA Chest, 09/17/17: Small nonocclusive pulmonary embolism in the right upper lobe, no right ventricular strain.             --ECHO, 10/normal left ventricular ejection fraction of 55-60%.13/18: Right ventricle with mild dilation.             --Doppler US BL LE, 09/18/17: Negative for deep vein thrombosis.             --Treatment:                          --Heparin gtt                         --Rivaroxaban, 09/20/17- --Labs, 10/05/17: WBC   9.3, Hgb 12.2, MCV 86.2, MCH 28.6, MCHC 33.2, RDW 14.3, Plt 294; --CTA Chest, 11/07/17: Interval resolution of the pulmonary embolus. --Labs, 11/09/17: WBC 12.9, Hgb 10.6, MCV 85.0, MCH 28.0, MCHC 32.9, RDW 13.5, Plt 297; Fe 14, FeSat 4%, TIBC 332, Ferritin 57 --ECHO, 11/23/17: Left ventricular ejection fraction 65-70%, right ventricle normal cavity size, IVC of normal caliber. --Labs, 01/11/18: Curative for prothrombin gene mutation, factor V Leiden mutation, or antiphospholipid antibody syndrome evidence   No history exists.    Medical History: Past Medical History:  Diagnosis Date  . Pulmonary embolism ([place])    a. Dx 09/2017, had IUD removed in case this contributed.  . Right ventricular dilation    a. at time of PE 09/2017.    Surgical History: History reviewed. No pertinent surgical history.  Family History: Family History  Problem Relation Age of Onset  . Hypertension Mother   . Hypertension Maternal Aunt   . Cancer Maternal Grandfather   . Pulmonary embolism Neg Hx   . Heart disease Neg Hx     Social History: Social History   Socioeconomic History  . Marital status: Single    Spouse name: Not on file  . Number of children: Not on file  . Years of education: Not on file  . Highest education level: Not on file  Occupational History  . Not on file  Social Needs  . Financial resource strain: Not on file  . Food insecurity:    Worry: Not on  file    Inability: Not on file  . Transportation needs:    Medical: Not on file    Non-medical: Not on file  Tobacco Use  . Smoking status: Never Smoker  . Smokeless tobacco: Never Used  Substance and Sexual Activity  . Alcohol use: No  . Drug use: Yes    Types: Marijuana    Comment: last mid-Oct  . Sexual activity: Yes    Birth control/protection: None  Lifestyle  . Physical activity:    Days per week: Not on file    Minutes per session: Not on file  . Stress: Not on file  Relationships  . Social connections:    Talks on phone: Not on file    Gets together: Not on file    Attends religious service: Not on file    Active member of club or organization: Not on file    Attends meetings of clubs or organizations: Not on file    Relationship status: Not on file  . Intimate partner violence:    Fear of current or ex partner:  Not on file    Emotionally abused: Not on file    Physically abused: Not on file    Forced sexual activity: Not on file  Other Topics Concern  . Not on file  Social History Narrative  . Not on file    Allergies: No Known Allergies  Medications:  No current outpatient medications on file.   No current facility-administered medications for this visit.     Review of Systems: Review of Systems  All other systems reviewed and are negative.    PHYSICAL EXAMINATION Blood pressure (!) 143/84, pulse 77, temperature 98.1 F (36.7 C), temperature source Oral, resp. rate 18, height 5' 3.5" (1.613 m), weight 206 lb (93.4 kg), SpO2 100 %.  ECOG PERFORMANCE STATUS: 0 - Asymptomatic  Physical Exam  Constitutional: She is oriented to person, place, and time. She appears well-nourished. No distress.  HENT:  Head: Normocephalic and atraumatic.  Mouth/Throat: Oropharynx is clear and moist. No oropharyngeal exudate.  Eyes: Pupils are equal, round, and reactive to light. Conjunctivae and EOM are normal. No scleral icterus.  Neck: No thyromegaly present.   Cardiovascular: Normal rate, regular rhythm, normal heart sounds and intact distal pulses. Exam reveals no gallop and no friction rub.  No murmur heard. Pulmonary/Chest: Effort normal and breath sounds normal. No stridor. No respiratory distress. She has no wheezes. She has no rales.  Abdominal: Soft. Bowel sounds are normal. She exhibits no distension and no mass. There is no tenderness. There is no guarding.  Musculoskeletal: She exhibits no edema.  Lymphadenopathy:    She has no cervical adenopathy.  Neurological: She is alert and oriented to person, place, and time. She displays normal reflexes. No cranial nerve deficit or sensory deficit.  Skin: Skin is warm and dry. No rash noted. She is not diaphoretic. No erythema. No pallor.     LABORATORY DATA: I have personally reviewed the data as listed: No visits with results within 1 Week(s) from this visit.  Latest known visit with results is:  Appointment on 04/12/2018  Component Date Value Ref Range Status  . Anticardiolipin IgG 04/12/2018 <9  0 - 14 GPL U/mL Final   Comment: (NOTE)                          Negative:              <15                          Indeterminate:     15 - 20                          Low-Med Positive: >20 - 80                          High Positive:         >80   . Anticardiolipin IgM 04/12/2018 9  0 - 12 MPL U/mL Final   Comment: (NOTE)                          Negative:              <13                          Indeterminate:  13 - 20                          Low-Med Positive: >20 - 80                          High Positive:         >80   . Anticardiolipin IgA 04/12/2018 <9  0 - 11 APL U/mL Final   Comment: (NOTE)                          Negative:              <12                          Indeterminate:     12 - 20                          Low-Med Positive: >20 - 80                          High Positive:         >80 Performed At: [facility] [place], [place]  [id number] Rush Farmer MD [id number] Performed at [facility], [place] [address], [place], [place] [address]   . Recommendations-PTGENE: 04/12/2018 Comment   Final   Comment: (NOTE) NEGATIVE No mutation identified. Comment: A point mutation (G20210A) in the factor II (prothrombin) gene is the second most common cause of inherited thrombophilia. The incidence of this mutation in the [place] Caucasian population is about 2% and in the [place] American population it is approximately 0.5%. This mutation is rare in the [place] and Native American population. Being heterozygous for a prothrombin mutation increases the risk for developing venous thrombosis about 2 to 3 times above the general population risk. Being homozygous for the prothrombin gene mutation increases the relative risk for venous thrombosis further, although it is not yet known how much further the risk is increased. In women heterozygous for the prothrombin gene mutation, the use of estrogen containing oral contraceptives increases the relative risk of venous thrombosis about 16 times and the risk of developing cerebral thrombosis is also significantly increased. In pregnancy the pr                          othrombin gene mutation increases risk for venous thrombosis and may increase risk for stillbirth, placental abruption, pre-eclampsia and fetal growth restriction. If the patient possesses two or more congenital or acquired thrombophilic risk factors, the risk for thrombosis may rise to more than the sum of the risk ratios for the individual mutations. This assay detects only the prothrombin G20210A mutation and does not measure genetic abnormalities elsewhere in the genome. Other thrombotic risk factors may be pursued through systematic clinical laboratory analysis. These factors include the R506Q (Leiden) mutation in the Factor V gene, plasma homocysteine levels, as well as testing for  deficiencies of antithrombin III, protein C and protein S. Genetic Counselors are available for health care providers to discuss results at [phone]-GENE [phone]). Methodology: DNA analysis of the Factor II gene was performed by PCR amplification followed by restriction analysis. [facility]  agnostic sensitivity is >99% for both. All the tests must be combined with clinical information for the most accurate interpretation. Molecular-based testing is highly accurate, but as in any laboratory test, diagnostic errors may occur. This test was developed and its performance characteristics determined by [facility]. It has not been cleared or approved by the Food and Drug Administration. Poort SR, et al. Blood. 1996; 24:0973-5329. Varga EA. Circulation. 2004; 924:Q68-T41. Mervin Hack, et [place]; 19:700-703. Allison Quarry, PhD, [facility] Ruben Reason, PhD, [facility] Annetta Maw, M.S., PhD, [facility] Alfredo Bach, PhD, [facility] Norva Riffle, PhD, [facility] Earlean Polka, PhD, [facility] Performed At: [facility] [address] [place], [place] [id number] Nechama Guard MD [id number] Performed at [facility], [place] [address], [place], [place] [address]   . Recommendations-F5LEID: 04/12/2018 Comment   Final   Comment: (NOTE) Result:  Negative (no mutation found) Factor V Leiden is a specific mutation (R506Q) in the factor V gene that is associated with an increased risk of venous thrombosis. Factor V Leiden is more resistant to inactivation by activated protein C.  As a result, factor V persists in the circulation leading to a mild hyper- coagulable state.  The Leiden mutation accounts for 90% - 95% of APC resistance.  Factor V Leiden has been reported in patients with deep vein thrombosis, pulmonary embolus, central retinal vein occlusion, cerebral sinus thrombosis and hepatic vein thrombosis. Other risk  factors to be considered in the workup for venous thrombosis include the G20210A mutation in the factor II (prothrombin) gene, protein S and C deficiency, and antithrombin deficiencies. Anticardiolipin antibody and lupus anticoagulant analysis may be appropriate for certain patients, as well as homocysteine levels. Contact your local [facility] for information on how to order additi                          onal testing if desired. **Genetic counselors are available for health care providers to**  discuss results at 1-800-345-GENE [phone]). Methodology: DNA analysis of the Factor V gene was performed by allele-specific PCR. The diagnostic sensitivity and specificity is >99% for both. Molecular-based testing is highly accurate, but as in any laboratory test, diagnostic errors may occur. All test results must be combined with clinical information for the most accurate interpretation. This test was developed and its performance characteristics determined by [facility]. It has not been cleared or approved by the Food and Drug Administration. References: Voelkerding K (1996).  Clin Lab Med [phone]. Allison Quarry, PhD, [facility] Ruben Reason, PhD, [facility] Annetta Maw, M.S., PhD, [facility] Alfredo Bach, PhD, [facility] Norva Riffle, PhD, [facility] Earlean Polka PhD, [facility] Performed At: [facility] [address] [place], [place] [id number] Nechama Guard MD [id number]                          0 Performed at [facility], [place] [address], [place], [place] [address]   . Homocysteine 04/12/2018 8.6  0.0 - 15.0 umol/L Final   Comment: (NOTE) Performed At: [facility] [place], [place] [id number] Rush Farmer MD [id number] Performed at [facility], [place] [address], [place], [place] [address]   . Beta-2 Glyco I IgG 04/12/2018 <9  0 - 20 GPI IgG units Final   Comment: (NOTE) The reference interval reflects  a 3SD or 99th percentile interval, which is thought to  represent a potentially clinically significant result in accordance with the International Consensus Statement on the classification criteria for definitive antiphospholipid syndrome (APS). J Thromb Haem 2006;4:295-306.   . Beta-2-Glycoprotein I IgM 04/12/2018 <9  0 - 32 GPI IgM units Final   Comment: (NOTE) The reference interval reflects a 3SD or 99th percentile interval, which is thought to represent a potentially clinically significant result in accordance with the International Consensus Statement on the classification criteria for definitive antiphospholipid syndrome (APS). J Thromb Haem 2006;4:295-306. Performed At: [facility] [place], [place] [id number] Rush Farmer MD [id number]   . Beta-2-Glycoprotein I IgA 04/12/2018 <9  0 - 25 GPI IgA units Final   Comment: (NOTE) The reference interval reflects a 3SD or 99th percentile interval, which is thought to represent a potentially clinically significant result in accordance with the International Consensus Statement on the classification criteria for definitive antiphospholipid syndrome (APS). J Thromb Haem 2006;4:295-306. Performed at [facility], [place] [address], [place], [place] [address]   . PTT Lupus Anticoagulant 04/12/2018 45.8  0.0 - 51.9 sec Final  . DRVVT 04/12/2018 39.3  0.0 - 47.0 sec Final  . Lupus Anticoag Interp 04/12/2018 Comment:   Corrected   Comment: (NOTE) No lupus anticoagulant was detected. Performed At: [facility] [place], [place] [id number] Rush Farmer MD [id number] Performed at [facility], [place] [address] [place], [place] [address]   . Protein S Ag, Total 04/12/2018 67  60 - 150 % Final   Comment: (NOTE) This test was developed and its performance characteristics determined by [facility]. It has not been cleared or approved by the  Food and Drug Administration. Performed At: [facility] [place], [place] [id number] Rush Farmer MD [id number] Performed at [facility], [place] [address], [place], [place] [address]   . Protein S Activity 04/12/2018 64  63 - 140 % Final   Comment: (NOTE) Protein S activity may be falsely increased (masking an abnormal, low result) in patients receiving direct Xa inhibitor (e.g., rivaroxaban, apixaban, edoxaban) or a direct thrombin inhibitor (e.g., dabigatran) anticoagulant treatment due to assay interference by these drugs. Performed At: [facility] [place], [place] [id number] Rush Farmer MD [id number] Performed at [facility], [place] [address], [place], [place] [address]   . Protein C, Total 04/12/2018 113  60 - 150 % Final   Comment: (NOTE) Performed At: [facility] [place], [place] [id number] Rush Farmer MD [id number] Performed at [facility], [place] [address] [place], [place] [address]   . Protein C Activity 04/12/2018 125  73 - 180 % Final   Comment: (NOTE) Performed At: [facility] [place], [place] [id number] Rush Farmer MD [id number] Performed at [facility], [place] [address] [place],  [address]   . AntiThromb III Func 04/12/2018 106  75 - 120 % Final   Performed at [facility], [place] [address] [place],  [address]  . Sodium 04/12/2018 141  136 - 145 mmol/L Final  . Potassium 04/12/2018 3.6  3.5 - 5.1 mmol/L Final  . Chloride 04/12/2018 107  98 - 109 mmol/L Final  . CO2 04/12/2018 26  22 - 29 mmol/L Final  . Glucose, Bld 04/12/2018 75  70 - 140 mg/dL Final  . BUN 04/12/2018 12  7 - 26 mg/dL Final  . Creatinine 04/12/2018  0.93  0.60 - 1.10 mg/dL Final  . Calcium 04/12/2018 9.6  8.4 - 10.4 mg/dL Final  . Total Protein 04/12/2018 7.8   6.4 - 8.3 g/dL Final  . Albumin 04/12/2018 3.9  3.5 - 5.0 g/dL Final  . AST 04/12/2018 15  5 - 34 U/L Final  . ALT 04/12/2018 12  0 - 55 U/L Final  . Alkaline Phosphatase 04/12/2018 69  40 - 150 U/L Final  . Total Bilirubin 04/12/2018 0.3  0.2 - 1.2 mg/dL Final  . GFR, Est Non Af Am 04/12/2018 >60  >60 mL/min Final  . GFR, Est AFR Am 04/12/2018 >60  >60 mL/min Final   Comment: (NOTE) The eGFR has been calculated using the CKD EPI equation. This calculation has not been validated in all clinical situations. eGFR's persistently <60 mL/min signify possible Chronic Kidney Disease.   Georgiann Hahn gap 04/12/2018 8  3 - 11 Final   Performed at [facility], [place] [address] [place], [place] [address]  . WBC Count 04/12/2018 9.0  3.9 - 10.3 K/uL Final  . RBC 04/12/2018 4.25  3.70 - 5.45 MIL/uL Final  . Hemoglobin 04/12/2018 11.7  11.6 - 15.9 g/dL Final  . HCT 04/12/2018 35.6  34.8 - 46.6 % Final  . MCV 04/12/2018 83.7  79.5 - 101.0 fL Final  . MCH 04/12/2018 27.5  25.1 - 34.0 pg Final  . MCHC 04/12/2018 32.8  31.5 - 36.0 g/dL Final  . RDW 04/12/2018 15.5* 11.2 - 14.5 % Final  . Platelet Count 04/12/2018 243  145 - 400 K/uL Final  . Neutrophils Relative % 04/12/2018 62  % Final  . Neutro Abs 04/12/2018 5.5  1.5 - 6.5 K/uL Final  . Lymphocytes Relative 04/12/2018 32  % Final  . Lymphs Abs 04/12/2018 2.9  0.9 - 3.3 K/uL Final  . Monocytes Relative 04/12/2018 5  % Final  . Monocytes Absolute 04/12/2018 0.5  0.1 - 0.9 K/uL Final  . Eosinophils Relative 04/12/2018 1  % Final  . Eosinophils Absolute 04/12/2018 0.1  0.0 - 0.5 K/uL Final  . Basophils Relative 04/12/2018 0  % Final  . Basophils Absolute 04/12/2018 0.0  0.0 - 0.1 K/uL Final   Performed at [facility], [place] [address] [place], [place] [address]       Ardath Sax, MD

## 2018-05-14 IMAGING — CT CT ANGIO CHEST
2 of 6 series · 17 of 46 positions shown · IV contrast (APPLIED)
Comparison: CT angiogram chest September 17, 2017; chest radiograph
September 22, 2017

CLINICAL DATA: Chest pain and shortness of breath. History of prior
pulmonary emboli

EXAM:
CT ANGIOGRAPHY CHEST WITH CONTRAST
TECHNIQUE: Multidetector CT imaging of the chest was performed using the
standard protocol during bolus administration of intravenous
contrast. Multiplanar CT image reconstructions and MIPs were
obtained to evaluate the vascular anatomy.
CONTRAST:  100 mL Isovue 370 nonionic

[Series 9: thins · axial · 0.65mm/px · z∈[+805,+1024]mm · 14 of 343 slices shown]
[im 15/343  lung]
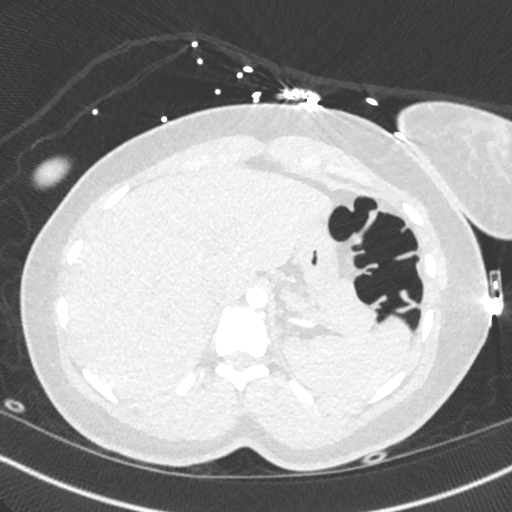
[im 45/343  soft-tissue]
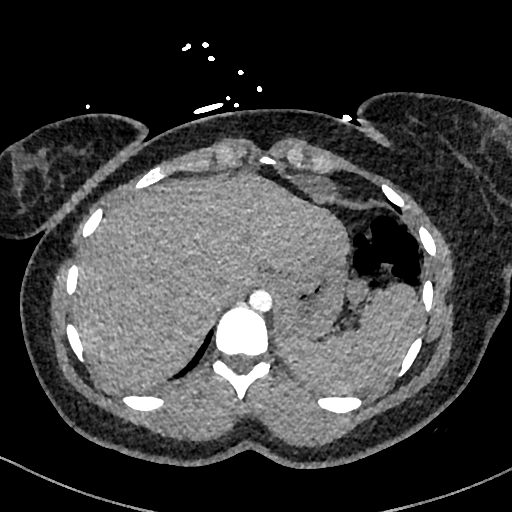
[im 60/343  lung]
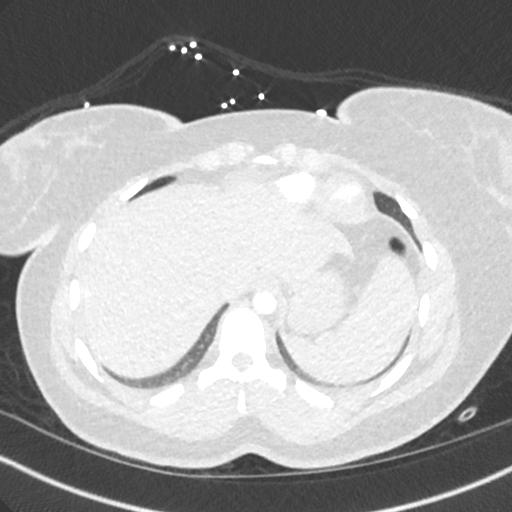
[im 90/343  soft-tissue]
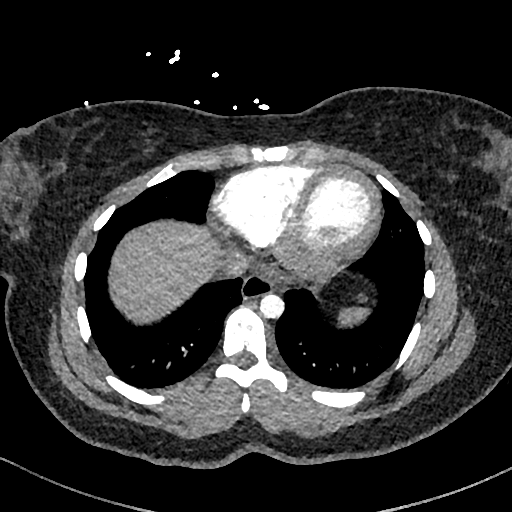
[im 119/343  lung]
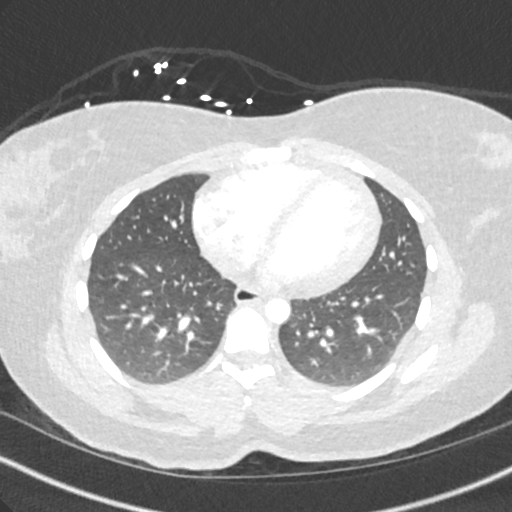
[im 134/343  soft-tissue]
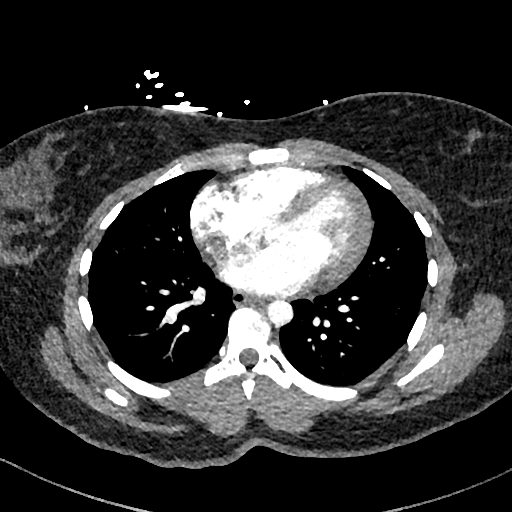
[im 164/343  lung]
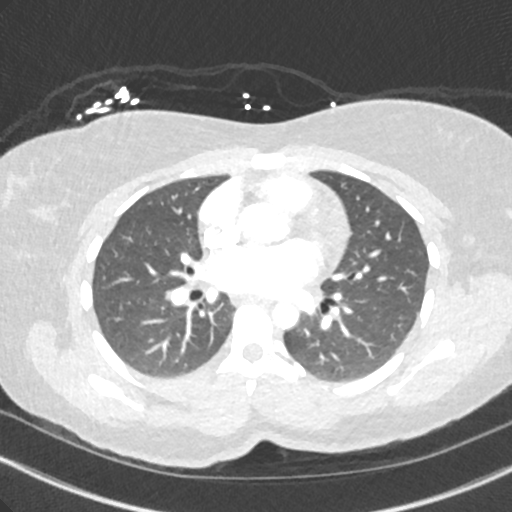
[im 179/343  soft-tissue]
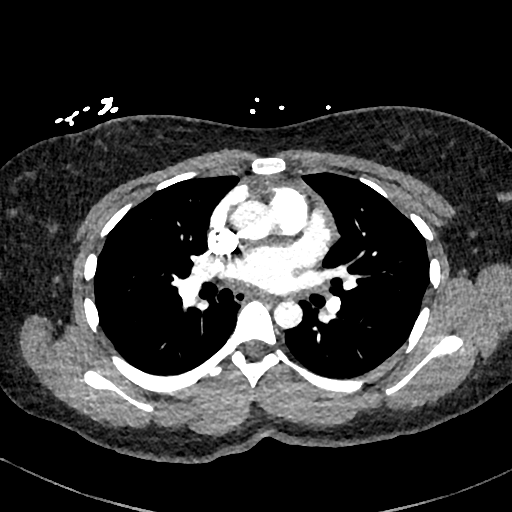
[im 209/343  lung]
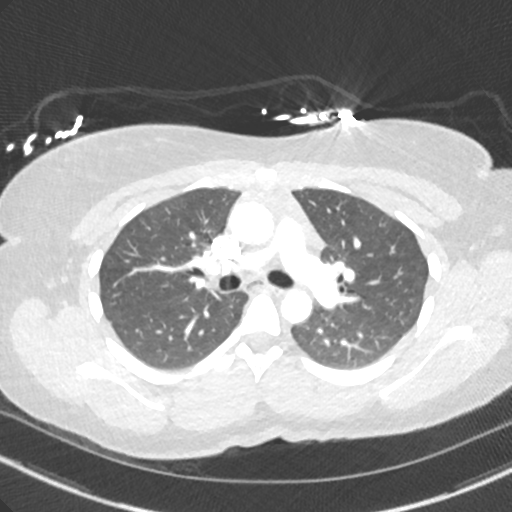
[im 224/343  soft-tissue]
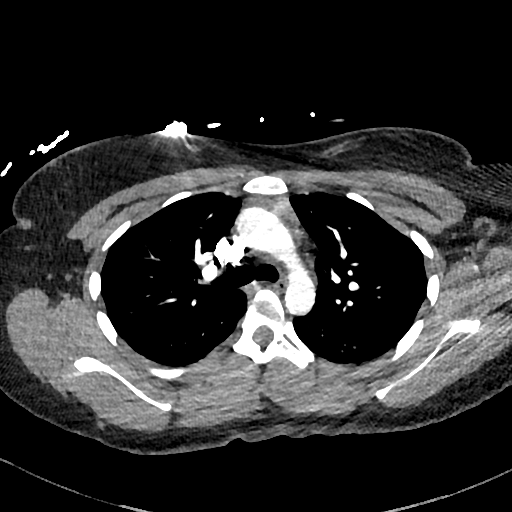
[im 253/343  lung]
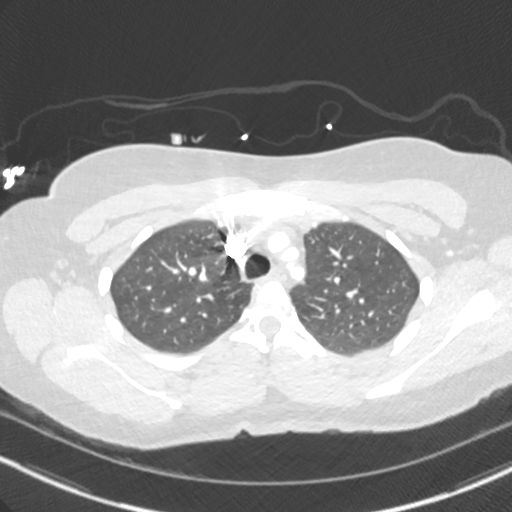
[im 283/343  soft-tissue]
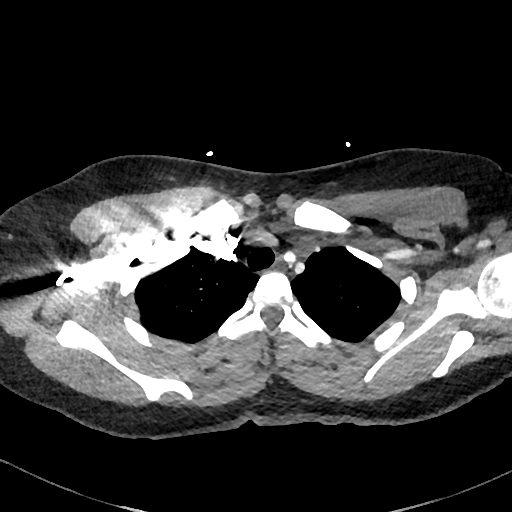
[im 298/343  lung]
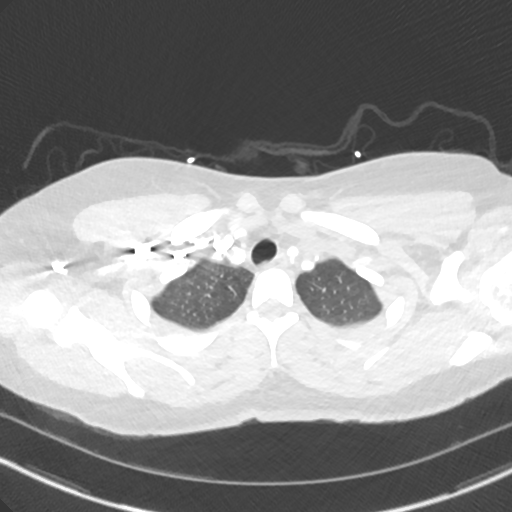
[im 328/343  soft-tissue]
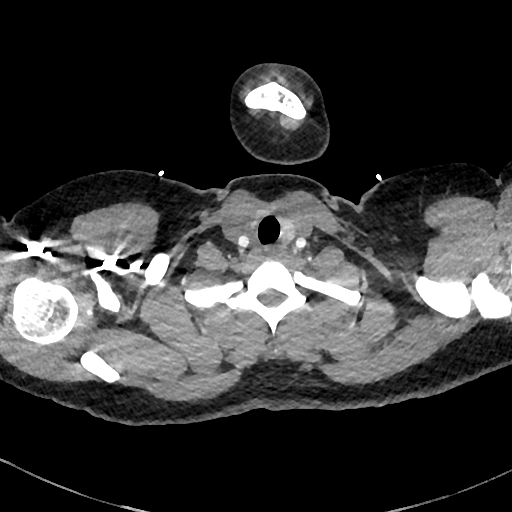

[Series 10: cor · coronal · 0.46mm/px · 3 of 125 slices shown]
[im 32/125  soft-tissue]
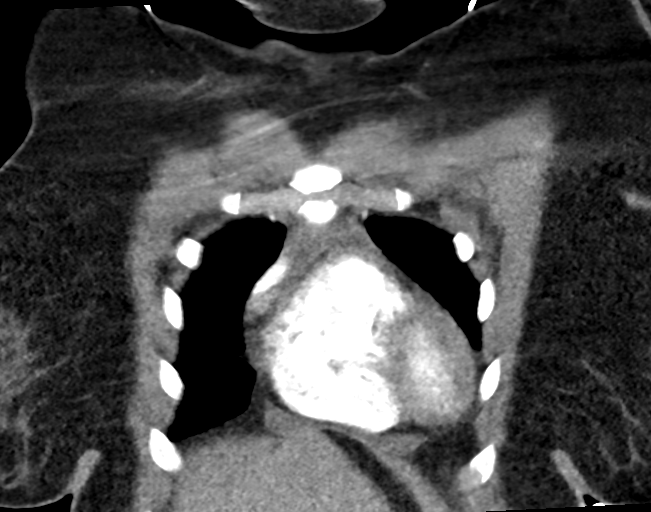
[im 63/125  soft-tissue]
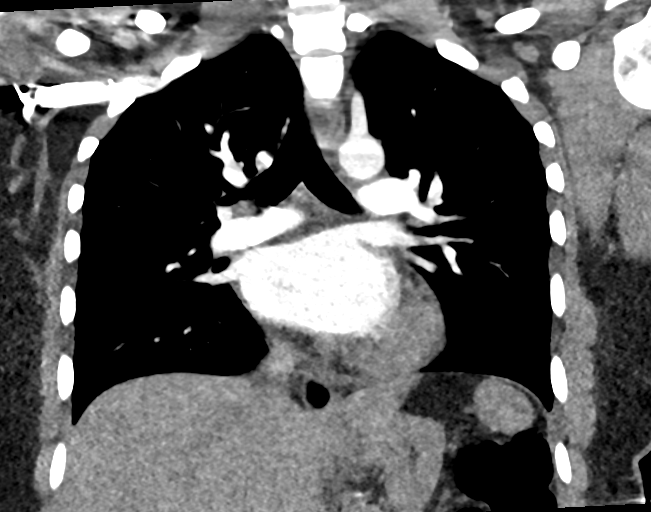
[im 94/125  soft-tissue]
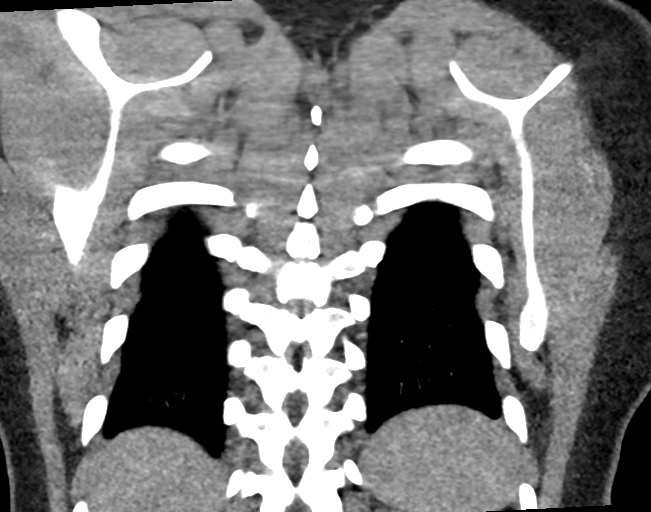

[17 of 46 positions shown; findings below may reference images not displayed]

FINDINGS: Cardiovascular: There is currently a small partially obstructing
pulmonary embolus in the anterior segment right upper lobe pulmonary
artery. This pulmonary embolus was present on the previous study and
is stable. There has not been progression of pulmonary embolus since
the recent prior study. There is no demonstrable right heart strain.
Cardiac contour is stable compared to recent study.

There is no thoracic aortic aneurysm or dissection. Visualized great
vessels appear normal. Pericardium is not appreciably thickened.

Mediastinum/Nodes: Visualized thyroid appears normal. There is
thymic tissue in the anterior mediastinum, normal for age. No
evident adenopathy. No esophageal lesions are appreciable.

Lungs/Pleura: There is a stable 2 mm nodular opacity in the anterior
segment of the left upper lobe seen on axial slice 60 series 8.
There is a stable nodular opacity in the superior segment of the
left lower lobe measuring 5 mm, seen on axial slice 60 series 8.
There is a stable nodular opacity abutting the pleura in the
posterior segment of the left lower lobe measuring 4 mm, seen on
axial slice 70 series 8. There is a nodular opacity in the inferior
lingula which is stable measuring 3 mm, seen on axial slice 81
series 8. Lungs elsewhere clear. No new parenchymal lung opacities
are evident. No pleural effusion. No evidence of pulmonary infarct.

Upper Abdomen: Visualized upper abdominal structures appear normal.

Musculoskeletal: There are no blastic or lytic bone lesions.

Review of the MIP images confirms the above findings.
IMPRESSION: 1. Incompletely obstructing focal pulmonary embolus in the anterior
segment right upper lobe pulmonary artery, stable. No progression of
pulmonary embolus or new pulmonary embolus evident compared to
recent prior study. No right heart strain evident.

2. Small nodular opacities on the left, stable. These nodular
opacities likely have benign etiology in this age group. No lung
edema or consolidation. No pulmonary infarct evident.

3.  No demonstrable adenopathy.

Critical Value/emergent results were called by telephone at the time
of interpretation on 09/22/2017 at [DATE] to DAVIDINO PARENTE PA ,
who verbally acknowledged these results.

## 2018-06-29 IMAGING — CR DG CHEST 2V
2 series · 2 of 2 positions shown · non-contrast
Comparison: 10/22/2017

CLINICAL DATA: Productive cough, fever, pleuritic chest pain and
shortness of breath for 3 days, coughing up yellow phlegm, history
of pulmonary embolism

EXAM:
CHEST  2 VIEW

[chest lat]
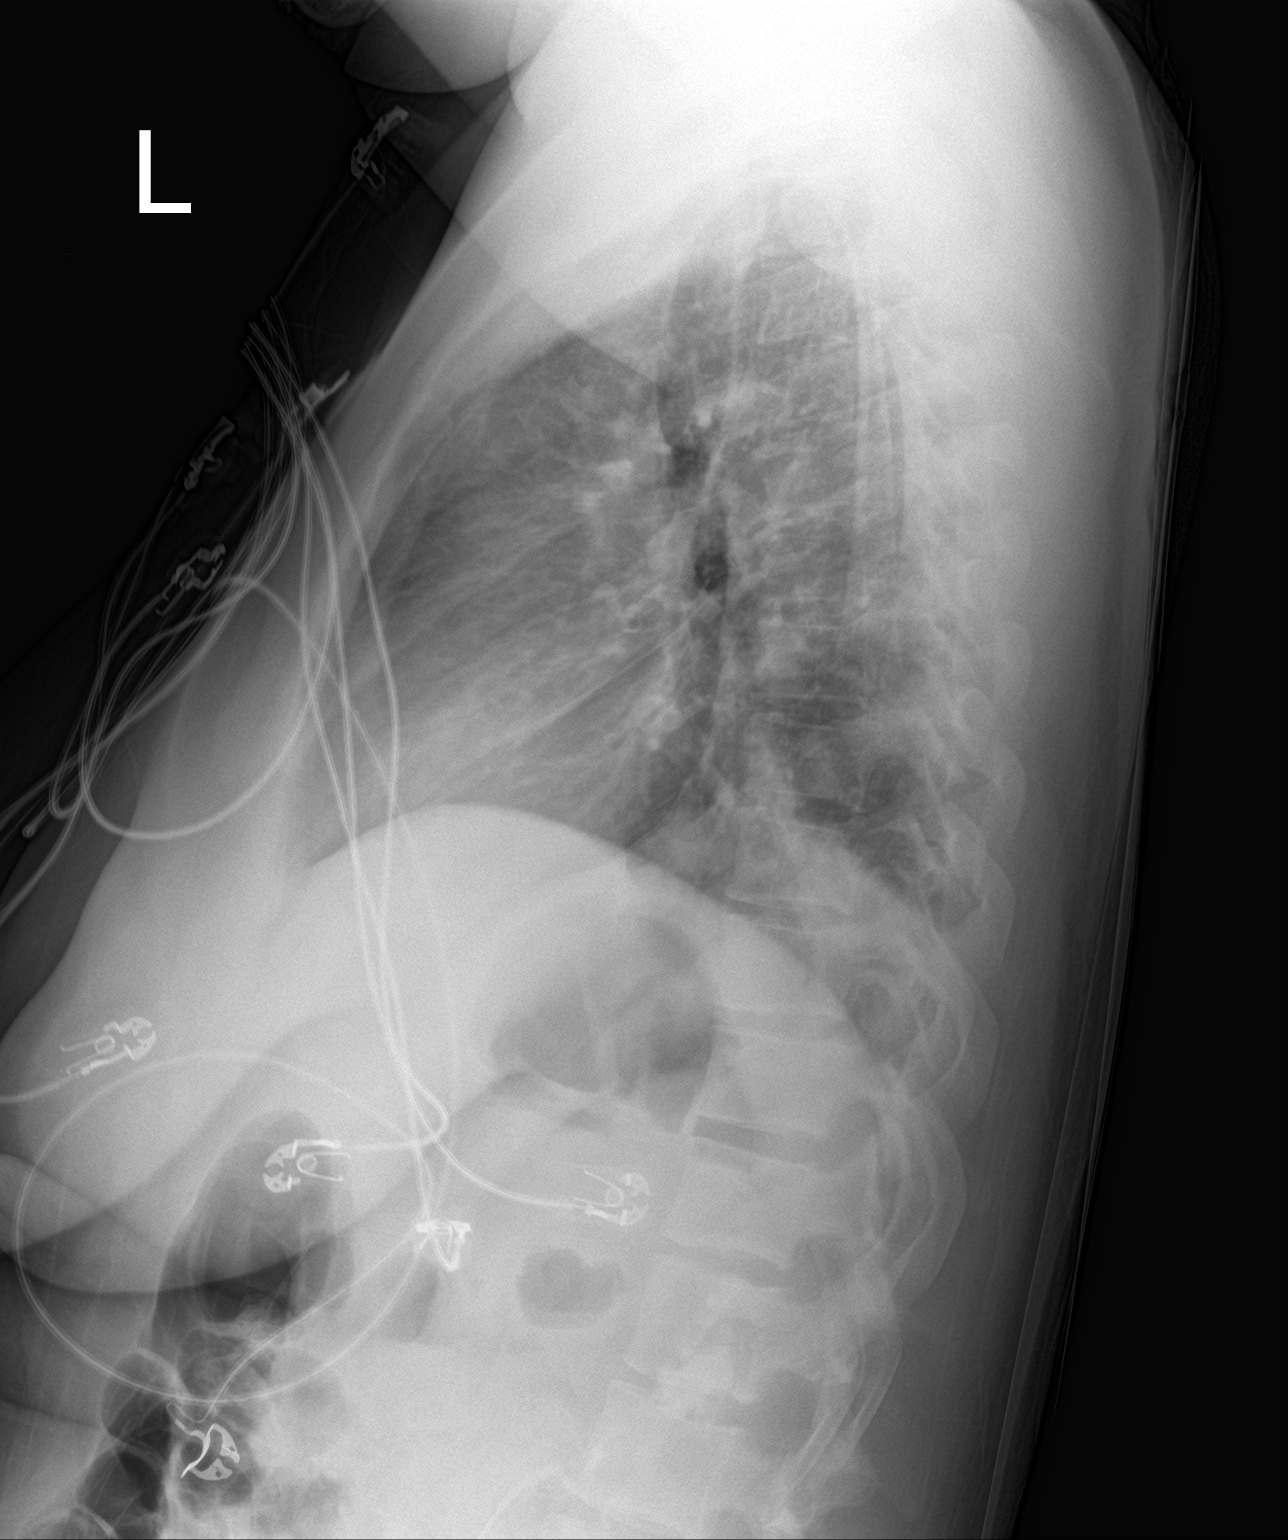

[chest ap]
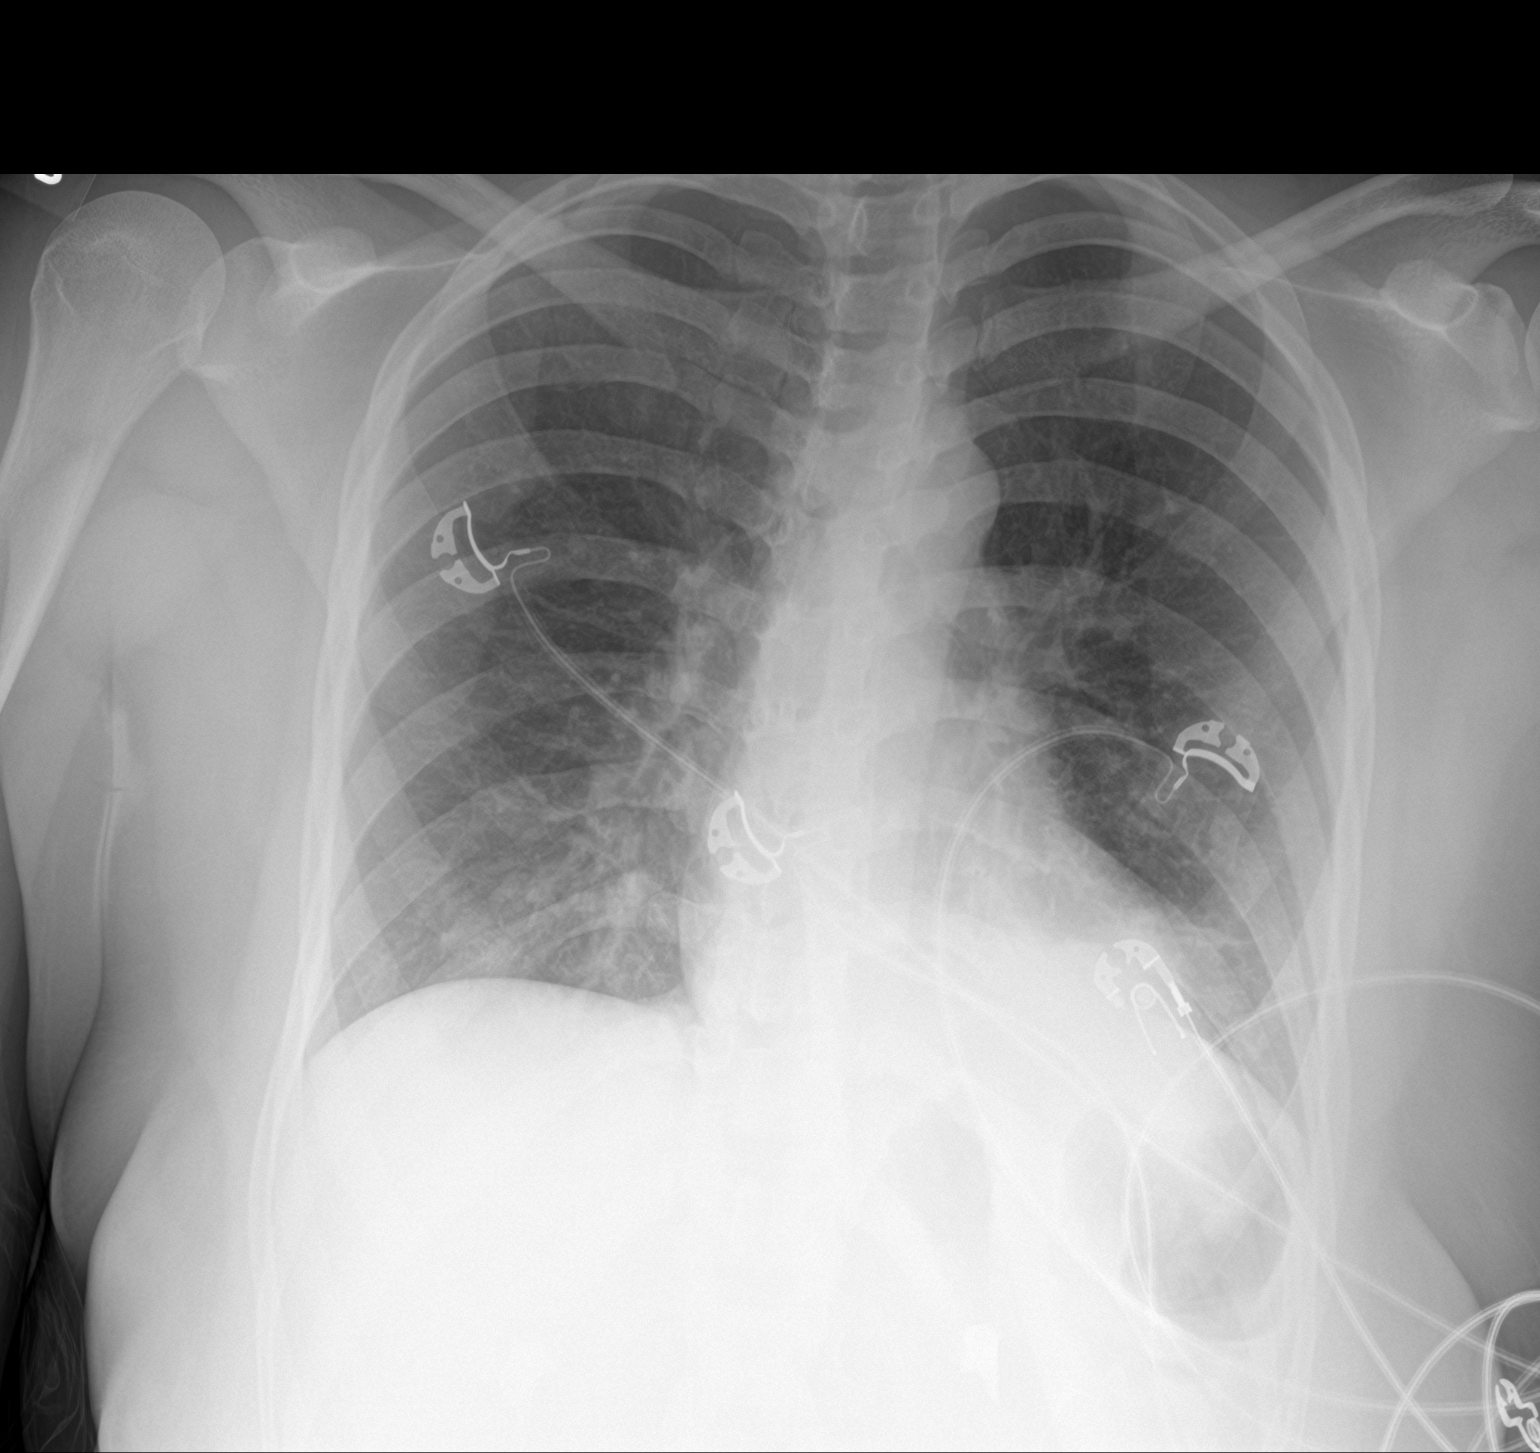

[2 of 2 positions shown; findings below may reference images not displayed]

FINDINGS: Normal heart size, mediastinal contours, and pulmonary vascularity.

LEFT lower lobe consolidation consistent with pneumonia.

Remaining lungs clear.

No pleural effusion or pneumothorax.

Bones unremarkable.
IMPRESSION: LEFT lower lobe pneumonia.

## 2018-08-25 DIAGNOSIS — A0839 Other viral enteritis: Secondary | ICD-10-CM | POA: Diagnosis not present

## 2018-08-25 DIAGNOSIS — R82998 Other abnormal findings in urine: Secondary | ICD-10-CM | POA: Diagnosis not present

## 2018-08-29 DIAGNOSIS — H5213 Myopia, bilateral: Secondary | ICD-10-CM | POA: Diagnosis not present

## 2018-08-31 DIAGNOSIS — L910 Hypertrophic scar: Secondary | ICD-10-CM | POA: Diagnosis not present

## 2018-09-05 ENCOUNTER — Encounter (HOSPITAL_BASED_OUTPATIENT_CLINIC_OR_DEPARTMENT_OTHER): Payer: Self-pay | Admitting: *Deleted

## 2018-09-05 ENCOUNTER — Other Ambulatory Visit: Payer: Self-pay

## 2018-09-05 ENCOUNTER — Emergency Department (HOSPITAL_BASED_OUTPATIENT_CLINIC_OR_DEPARTMENT_OTHER)
Admission: EM | Admit: 2018-09-05 | Discharge: 2018-09-05 | Disposition: A | Discharge status: Home / Self Care | Payer: BLUE CROSS/BLUE SHIELD | Attending: Emergency Medicine | Admitting: Emergency Medicine

## 2018-09-05 DIAGNOSIS — Y999 Unspecified external cause status: Secondary | ICD-10-CM | POA: Diagnosis not present

## 2018-09-05 DIAGNOSIS — Z86711 Personal history of pulmonary embolism: Secondary | ICD-10-CM | POA: Diagnosis not present

## 2018-09-05 DIAGNOSIS — Y92410 Unspecified street and highway as the place of occurrence of the external cause: Secondary | ICD-10-CM | POA: Diagnosis not present

## 2018-09-05 DIAGNOSIS — R51 Headache: Secondary | ICD-10-CM | POA: Insufficient documentation

## 2018-09-05 DIAGNOSIS — S8001XA Contusion of right knee, initial encounter: Secondary | ICD-10-CM | POA: Diagnosis not present

## 2018-09-05 DIAGNOSIS — Y939 Activity, unspecified: Secondary | ICD-10-CM | POA: Diagnosis not present

## 2018-09-05 DIAGNOSIS — S8991XA Unspecified injury of right lower leg, initial encounter: Secondary | ICD-10-CM | POA: Diagnosis not present

## 2018-09-05 MED ORDER — IBUPROFEN 600 MG PO TABS: 600.0000 mg | ORAL_TABLET | Freq: Four times a day (QID) | ORAL | 0 refills | Status: DC | PRN

## 2018-09-05 MED ORDER — CYCLOBENZAPRINE HCL 10 MG PO TABS: 10.0000 mg | ORAL_TABLET | Freq: Two times a day (BID) | ORAL | 0 refills | Status: DC | PRN

## 2018-09-05 MED FILL — IBUPROFEN 600 MG TABLET: 600 | 8 days supply | Qty: 30 | Fill #0

## 2018-09-05 MED FILL — CYCLOBENZAPRINE HCL 10 MG T: 10 | 10 days supply | Qty: 20 | Fill #0
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
## 2018-09-07 ENCOUNTER — Emergency Department (HOSPITAL_BASED_OUTPATIENT_CLINIC_OR_DEPARTMENT_OTHER)
Admission: EM | Admit: 2018-09-07 | Discharge: 2018-09-07 | Disposition: A | Discharge status: Home / Self Care | Payer: BLUE CROSS/BLUE SHIELD | Attending: Emergency Medicine | Admitting: Emergency Medicine

## 2018-09-07 ENCOUNTER — Other Ambulatory Visit: Payer: Self-pay

## 2018-09-07 ENCOUNTER — Encounter (HOSPITAL_BASED_OUTPATIENT_CLINIC_OR_DEPARTMENT_OTHER): Payer: Self-pay | Admitting: Emergency Medicine

## 2018-09-07 ENCOUNTER — Emergency Department (HOSPITAL_BASED_OUTPATIENT_CLINIC_OR_DEPARTMENT_OTHER): Payer: BLUE CROSS/BLUE SHIELD

## 2018-09-07 DIAGNOSIS — Y939 Activity, unspecified: Secondary | ICD-10-CM | POA: Diagnosis not present

## 2018-09-07 DIAGNOSIS — M25511 Pain in right shoulder: Secondary | ICD-10-CM | POA: Insufficient documentation

## 2018-09-07 DIAGNOSIS — Y929 Unspecified place or not applicable: Secondary | ICD-10-CM | POA: Diagnosis not present

## 2018-09-07 DIAGNOSIS — Y999 Unspecified external cause status: Secondary | ICD-10-CM | POA: Diagnosis not present

## 2018-09-07 MED ORDER — HYDROCODONE-ACETAMINOPHEN 5-325 MG PO TABS
1.0000 | ORAL_TABLET | Freq: Once | ORAL | Status: DC
  Filled 2018-09-07: qty 1

## 2018-09-07 MED ORDER — METHOCARBAMOL 500 MG PO TABS: 500.0000 mg | ORAL_TABLET | Freq: Two times a day (BID) | ORAL | 0 refills | Status: AC

## 2018-09-07 NOTE — Discharge Instructions (Signed)
You can take Tylenol or Ibuprofen as directed for pain. You can alternate Tylenol and Ibuprofen every 4 hours. If you take Tylenol at 1pm, then you can take Ibuprofen at 5pm. Then you can take Tylenol again at 9pm.   Take Robaxin as prescribed. This medication will make you drowsy so do not drive or drink alcohol when taking it.  Follow the RICE (Rest, Ice, Compression, Elevation) protocol as directed.   Wear sling for support and stabilization.  If your symptoms do not improve, please follow-up with referred orthopedic doctor for further evaluation.  Return to emergency department for any worsening pain, redness or swelling of the shoulder, numbness/weakness of the arm or any other worsening concerning symptoms.
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
## 2018-09-07 NOTE — ED Notes (Signed)
Patient transported to X-ray 
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
## 2018-09-11 ENCOUNTER — Ambulatory Visit (INDEPENDENT_AMBULATORY_CARE_PROVIDER_SITE_OTHER): Payer: BLUE CROSS/BLUE SHIELD | Admitting: General Practice

## 2018-09-11 DIAGNOSIS — Z3201 Encounter for pregnancy test, result positive: Secondary | ICD-10-CM | POA: Diagnosis not present

## 2018-09-11 LAB — POCT PREGNANCY, URINE: Preg Test, Ur: POSITIVE — AB

## 2018-09-11 MED ORDER — PRENATAL VITAMINS 0.8 MG PO TABS: 1.0000 | ORAL_TABLET | Freq: Every day | ORAL | 12 refills | Status: DC

## 2018-09-11 NOTE — Progress Notes (Signed)
Patient presents to office today for pregnancy test. UPT +. Patient reports first positive home test today. LMP 08/13/18 EDD 05/19/18 [redacted]w[redacted]d. Patient denies taking any meds/vitamins. Patient reports a history of PE August 2018. Per Dr Debroah Loop, patient should be seen around 8-9 weeks for new OB appt. Rx for prenatal vitamins given. New OB packet given as well. Patient had no questions.

## 2018-09-18 ENCOUNTER — Telehealth: Payer: Self-pay | Admitting: Family Medicine

## 2018-09-18 NOTE — Telephone Encounter (Signed)
Called and spoke with pt about her NEW OB appt on 10/09/18. Pt verbalized that this appt would work and she will be here.

## 2018-09-26 DIAGNOSIS — R51 Headache: Secondary | ICD-10-CM | POA: Diagnosis not present

## 2018-10-09 ENCOUNTER — Encounter: Payer: Self-pay | Admitting: Obstetrics & Gynecology

## 2018-10-09 ENCOUNTER — Ambulatory Visit: Payer: BLUE CROSS/BLUE SHIELD | Admitting: Clinical

## 2018-10-09 ENCOUNTER — Ambulatory Visit (INDEPENDENT_AMBULATORY_CARE_PROVIDER_SITE_OTHER): Payer: BLUE CROSS/BLUE SHIELD | Admitting: Obstetrics & Gynecology

## 2018-10-09 VITALS — BP 128/80 | HR 78 | Wt 214.0 lb

## 2018-10-09 DIAGNOSIS — O0990 Supervision of high risk pregnancy, unspecified, unspecified trimester: Secondary | ICD-10-CM | POA: Diagnosis not present

## 2018-10-09 DIAGNOSIS — Z6791 Unspecified blood type, Rh negative: Secondary | ICD-10-CM | POA: Insufficient documentation

## 2018-10-09 DIAGNOSIS — O26899 Other specified pregnancy related conditions, unspecified trimester: Secondary | ICD-10-CM

## 2018-10-09 DIAGNOSIS — O0991 Supervision of high risk pregnancy, unspecified, first trimester: Secondary | ICD-10-CM

## 2018-10-09 DIAGNOSIS — Z23 Encounter for immunization: Secondary | ICD-10-CM | POA: Diagnosis not present

## 2018-10-09 DIAGNOSIS — Z113 Encounter for screening for infections with a predominantly sexual mode of transmission: Secondary | ICD-10-CM | POA: Diagnosis not present

## 2018-10-09 DIAGNOSIS — O26891 Other specified pregnancy related conditions, first trimester: Secondary | ICD-10-CM

## 2018-10-09 DIAGNOSIS — Z86711 Personal history of pulmonary embolism: Secondary | ICD-10-CM

## 2018-10-09 HISTORY — DX: Other specified pregnancy related conditions, unspecified trimester: Z67.91

## 2018-10-09 HISTORY — DX: Other specified pregnancy related conditions, unspecified trimester: O26.899

## 2018-10-09 LAB — POCT URINALYSIS DIP (DEVICE)
Bilirubin Urine: NEGATIVE
GLUCOSE, UA: NEGATIVE mg/dL
Hgb urine dipstick: NEGATIVE
Ketones, ur: NEGATIVE mg/dL
Leukocytes, UA: NEGATIVE
Nitrite: NEGATIVE
PROTEIN: NEGATIVE mg/dL
Specific Gravity, Urine: 1.025 (ref 1.005–1.030)
UROBILINOGEN UA: 0.2 mg/dL (ref 0.0–1.0)
pH: 6 (ref 5.0–8.0)

## 2018-10-09 MED ORDER — ENOXAPARIN SODIUM 40 MG/0.4ML ~~LOC~~ SOLN: 40.0000 mg | SUBCUTANEOUS | 4 refills | Status: DC

## 2018-10-09 NOTE — Progress Notes (Signed)
Subjective:   Alexis Richardson is a 17 y.o. G1P0000 at [redacted]w[redacted]d by LMP being seen today for her first obstetrical visit.  Her obstetrical history is significant for history of unprovoked pulmonary embolus last year, negative thrombophilia panel. had a progestin IUD in place at time of the PE, this was removed. Patient is accompanied by her mother and boyfriend.   Patient reports mild nausea, no other concerning symptoms.  HISTORY: OB History  Gravida Para Term Preterm AB Living  1 0 0 0 0 0  SAB TAB Ectopic Multiple Live Births  0 0 0 0 0    # Outcome Date GA Lbr Len/2nd Weight Sex Delivery Anes PTL Lv  1 Current           Denies any STIs or or GYN history  Past Medical History:  Diagnosis Date  . Pulmonary embolism (HCC)    a. Dx 09/2017, had IUD removed in case this contributed.  . Right ventricular dilation    a. at time of PE 09/2017.   Past Surgical History:  Procedure Laterality Date  . KELOID EXCISION     Family History  Problem Relation Age of Onset  . Hypertension Mother   . Hypertension Maternal Aunt   . Cancer Maternal Grandfather   . Pulmonary embolism Neg Hx   . Heart disease Neg Hx    Social History   Tobacco Use  . Smoking status: Never Smoker  . Smokeless tobacco: Never Used  Substance Use Topics  . Alcohol use: No  . Drug use: Not Currently    Types: Marijuana    Comment: last mid-Oct   No Known Allergies Current Outpatient Medications on File Prior to Visit  Medication Sig Dispense Refill  . Prenatal Multivit-Min-Fe-FA (PRENATAL VITAMINS) 0.8 MG tablet Take 1 tablet by mouth daily. 30 tablet 12   No current facility-administered medications on file prior to visit.     Review of Systems Pertinent items noted in HPI and remainder of comprehensive ROS otherwise negative.  Exam   Vitals:   10/09/18 1006 10/09/18 1017  BP: 138/84 128/80  Pulse: 81 78  Weight: 214 lb (97.1 kg)    Fetal Heart Rate (bpm): 158 done via bedside  ultrasound Patient informed that the ultrasound is considered a limited obstetric ultrasound and is not intended to be a complete ultrasound exam.  Patient also informed that the ultrasound is not being completed with the intent of assessing for fetal or placental anomalies or any pelvic abnormalities.  Explained that the purpose of today's ultrasound is to assess for fetal heart rate.  Patient acknowledges the purpose of the exam and the limitations of the study.  General: well-developed, well-nourished female in no acute distress  Breasts:  normal appearance, no masses or tenderness bilaterally  Skin: normal coloration and turgor, no rashes  Neurologic: oriented, normal, negative, normal mood  Extremities: normal strength, tone, and muscle mass, ROM of all joints is normal  HEENT PERRLA, extraocular movement intact and sclera clear, anicteric  Mouth/Teeth mucous membranes moist, pharynx normal without lesions and dental hygiene good  Neck supple and no masses  Cardiovascular: regular rate and rhythm  Respiratory:  no respiratory distress, normal breath sounds  Abdomen: soft, non-tender; bowel sounds normal; no masses,  no organomegaly  Pelvic: deferred     Assessment:   Pregnancy: G1P0000 Patient Active Problem List   Diagnosis Date Noted  . Supervision of high risk pregnancy, antepartum 10/09/2018  . Rh negative state in  antepartum period 10/09/2018  . History of pulmonary embolus (PE)   . Keloid of skin 06/24/2017     Plan:  1. History of pulmonary embolus (PE) Discussed need for VTE prophylaxis during pregnancy.  Lovenox prescribed, education given to her about self-administration. Will need throughout pregnancy and up to six weeks postpartum. - enoxaparin (LOVENOX) 40 MG/0.4ML injection; Inject 0.4 mLs (40 mg total) into the skin daily.  Dispense: 90 Syringe; Refill: 4  2.Rh negative state in antepartum period  Will need Rhogam at 28 weeks and possibly postpartum.  3.  Supervision of high risk pregnancy, antepartum - [facility] AMB BABYSCRIPTS OPT IN - Culture, OB Urine - GC/Chlamydia probe amp ([place])not at [facility] - Hemoglobinopathy Evaluation - Obstetric Panel, Including HIV - SMN1 COPY NUMBER ANALYSIS (SMA Carrier Screen) - Cystic fibrosis gene test - Flu Vaccine QUAD 36+ mos IM - Comprehensive metabolic panel - Hemoglobin A1c Initial labs drawn. Continue prenatal vitamins. Genetic Screening discussed, NIPS: to be done at next visit. Ultrasound discussed; fetal anatomic survey: to be ordered later. Problem list reviewed and updated. The nature of [facility] with multiple MDs and other Advanced Practice Providers was explained to patient; also emphasized that residents, students are part of our team. Routine obstetric precautions reviewed. Return in about 4 weeks (around 11/06/2018) for OB Visit (HOB).     Jaynie Collins, MD, FACOG Obstetrician & Gynecologist, [facility] for [employer], [facility]

## 2018-10-09 NOTE — BH Specialist Note (Signed)
Integrated Behavioral Health Initial Visit  MRN: [id number] Name: Alexis Richardson  Number of Integrated Behavioral Health Clinician visits:: 1/6 Session Start time: 9:35  Session End time: 9:46 Total time: 15 minutes  Type of Service: Integrated Behavioral Health- Individual/Family Interpretor:No. Interpretor Name and Language: N/A   Warm Hand Off Completed.       SUBJECTIVE: Alexis Richardson is a 17 y.o. female accompanied by n/a Patient was referred by Jaynie Collins, MD for Initial OB introduction to integrated behavioral health services . Patient reports the following symptoms/concerns: Pt states no particular concerns today.  Duration of problem: n/a; Severity of problem: n/a  OBJECTIVE: Mood: Normal and Affect: Appropriate Risk of harm to self or others: No plan to harm self or others  LIFE CONTEXT: Family and Social: Pt's family and friends supportive School/Work: - Self-Care: - Life Changes: Current pregnancy  GOALS ADDRESSED: 1. Increase knowledge and/or ability of: healthy habits   INTERVENTIONS: Standardized Assessments completed: GAD-7 and PHQ 9  ASSESSMENT: Patient currently experiencing Supervision of high risk pregnancy, antepartum.   Patient may benefit from .intialob .  PLAN: 1. Follow up with behavioral health clinician on : As needed 2. Behavioral recommendations:  -Continue taking prenatal vitamin, as recommended by medical provider 3. Referral(s): Integrated [employer] (In Clinic) 4. "From scale of 1-10, how likely are you to follow plan?": 10  Rae Lips, LCSW  Depression screen [facility] 2/9 10/09/2018  Decreased Interest 0  Down, Depressed, Hopeless 1  PHQ - 2 Score 1  Altered sleeping 1  Tired, decreased energy 0  Change in appetite 0  Feeling bad or failure about yourself  0  Trouble concentrating 0  Moving slowly or fidgety/restless 0  Suicidal thoughts 0  PHQ-9 Score 2   GAD 7 : Generalized Anxiety Score 10/09/2018   Nervous, Anxious, on Edge 0  Control/stop worrying 0  Worry too much - different things 0  Trouble relaxing 0  Restless 0  Easily annoyed or irritable 1  Afraid - awful might happen 0  Total GAD 7 Score 1

## 2018-10-09 NOTE — Progress Notes (Signed)
Bedside ultrasound performed for FHR. FHR 158 bpm.

## 2018-10-09 NOTE — Patient Instructions (Addendum)
First Trimester of Pregnancy The first trimester of pregnancy is from week 1 until the end of week 13 (months 1 through 3). A week after a sperm fertilizes an egg, the egg will implant on the wall of the uterus. This embryo will begin to develop into a baby. Genes from you and your partner will form the baby. The female genes will determine whether the baby will be a boy or a girl. At 6-8 weeks, the eyes and face will be formed, and the heartbeat can be seen on ultrasound. At the end of 12 weeks, all the baby's organs will be formed. Now that you are pregnant, you will want to do everything you can to have a healthy baby. Two of the most important things are to get good prenatal care and to follow your health care provider's instructions. Prenatal care is all the medical care you receive before the baby's birth. This care will help prevent, find, and treat any problems during the pregnancy and childbirth. Body changes during your first trimester Your body goes through many changes during pregnancy. The changes vary from woman to woman.  You may gain or lose a couple of pounds at first.  You may feel sick to your stomach (nauseous) and you may throw up (vomit). If the vomiting is uncontrollable, call your health care provider.  You may tire easily.  You may develop headaches that can be relieved by medicines. All medicines should be approved by your health care provider.  You may urinate more often. Painful urination may mean you have a bladder infection.  You may develop heartburn as a result of your pregnancy.  You may develop constipation because certain hormones are causing the muscles that push stool through your intestines to slow down.  You may develop hemorrhoids or swollen veins (varicose veins).  Your breasts may begin to grow larger and become tender. Your nipples may stick out more, and the tissue that surrounds them (areola) may become darker.  Your gums may bleed and may be  sensitive to brushing and flossing.  Dark spots or blotches (chloasma, mask of pregnancy) may develop on your face. This will likely fade after the baby is born.  Your menstrual periods will stop.  You may have a loss of appetite.  You may develop cravings for certain kinds of food.  You may have changes in your emotions from day to day, such as being excited to be pregnant or being concerned that something may go wrong with the pregnancy and baby.  You may have more vivid and strange dreams.  You may have changes in your hair. These can include thickening of your hair, rapid growth, and changes in texture. Some women also have hair loss during or after pregnancy, or hair that feels dry or thin. Your hair will most likely return to normal after your baby is born.  What to expect at prenatal visits During a routine prenatal visit:  You will be weighed to make sure you and the baby are growing normally.  Your blood pressure will be taken.  Your abdomen will be measured to track your baby's growth.  The fetal heartbeat will be listened to between weeks 10 and 14 of your pregnancy.  Test results from any previous visits will be discussed.  Your health care provider may ask you:  How you are feeling.  If you are feeling the baby move.  If you have had any abnormal symptoms, such as leaking fluid, bleeding, severe headaches,   or abdominal cramping.  If you are using any tobacco products, including cigarettes, chewing tobacco, and electronic cigarettes.  If you have any questions.  Other tests that may be performed during your first trimester include:  Blood tests to find your blood type and to check for the presence of any previous infections. The tests will also be used to check for low iron levels (anemia) and protein on red blood cells (Rh antibodies). Depending on your risk factors, or if you previously had diabetes during pregnancy, you may have tests to check for high blood  sugar that affects pregnant women (gestational diabetes).  Urine tests to check for infections, diabetes, or protein in the urine.  An ultrasound to confirm the proper growth and development of the baby.  Fetal screens for spinal cord problems (spina bifida) and Down syndrome.  HIV (human immunodeficiency virus) testing. Routine prenatal testing includes screening for HIV, unless you choose not to have this test.  You may need other tests to make sure you and the baby are doing well.  Follow these instructions at home: Medicines  Follow your health care provider's instructions regarding medicine use. Specific medicines may be either safe or unsafe to take during pregnancy.  Take a prenatal vitamin that contains at least 600 micrograms (mcg) of folic acid.  If you develop constipation, try taking a stool softener if your health care provider approves. Eating and drinking  Eat a balanced diet that includes fresh fruits and vegetables, whole grains, good sources of protein such as meat, eggs, or tofu, and low-fat dairy. Your health care provider will help you determine the amount of weight gain that is right for you.  Avoid raw meat and uncooked cheese. These carry germs that can cause birth defects in the baby.  Eating four or five small meals rather than three large meals a day may help relieve nausea and vomiting. If you start to feel nauseous, eating a few soda crackers can be helpful. Drinking liquids between meals, instead of during meals, also seems to help ease nausea and vomiting.  Limit foods that are high in fat and processed sugars, such as fried and sweet foods.  To prevent constipation: ? Eat foods that are high in fiber, such as fresh fruits and vegetables, whole grains, and beans. ? Drink enough fluid to keep your urine clear or pale yellow. Activity  Exercise only as directed by your health care provider. Most women can continue their usual exercise routine during  pregnancy. Try to exercise for 30 minutes at least 5 days a week. Exercising will help you: ? Control your weight. ? Stay in shape. ? Be prepared for labor and delivery.  Experiencing pain or cramping in the lower abdomen or lower back is a good sign that you should stop exercising. Check with your health care provider before continuing with normal exercises.  Try to avoid standing for long periods of time. Move your legs often if you must stand in one place for a long time.  Avoid heavy lifting.  Wear low-heeled shoes and practice good posture.  You may continue to have sex unless your health care provider tells you not to. Relieving pain and discomfort  Wear a good support bra to relieve breast tenderness.  Take warm sitz baths to soothe any pain or discomfort caused by hemorrhoids. Use hemorrhoid cream if your health care provider approves.  Rest with your legs elevated if you have leg cramps or low back pain.  If you develop   varicose veins in your legs, wear support hose. Elevate your feet for 15 minutes, 3-4 times a day. Limit salt in your diet. Prenatal care  Schedule your prenatal visits by the twelfth week of pregnancy. They are usually scheduled monthly at first, then more often in the last 2 months before delivery.  Write down your questions. Take them to your prenatal visits.  Keep all your prenatal visits as told by your health care provider. This is important. Safety  Wear your seat belt at all times when driving.  Make a list of emergency phone numbers, including numbers for family, friends, the hospital, and police and fire departments. General instructions  Ask your health care provider for a referral to a local prenatal education class. Begin classes no later than the beginning of month 6 of your pregnancy.  Ask for help if you have counseling or nutritional needs during pregnancy. Your health care provider can offer advice or refer you to specialists for help  with various needs.  Do not use hot tubs, steam rooms, or saunas.  Do not douche or use tampons or scented sanitary pads.  Do not cross your legs for long periods of time.  Avoid cat litter boxes and soil used by cats. These carry germs that can cause birth defects in the baby and possibly loss of the fetus by miscarriage or stillbirth.  Avoid all smoking, herbs, alcohol, and medicines not prescribed by your health care provider. Chemicals in these products affect the formation and growth of the baby.  Do not use any products that contain nicotine or tobacco, such as cigarettes and e-cigarettes. If you need help quitting, ask your health care provider. You may receive counseling support and other resources to help you quit.  Schedule a dentist appointment. At home, brush your teeth with a soft toothbrush and be gentle when you floss. Contact a health care provider if:  You have dizziness.  You have mild pelvic cramps, pelvic pressure, or nagging pain in the abdominal area.  You have persistent nausea, vomiting, or diarrhea.  You have a bad smelling vaginal discharge.  You have pain when you urinate.  You notice increased swelling in your face, hands, legs, or ankles.  You are exposed to fifth disease or chickenpox.  You are exposed to German measles (rubella) and have never had it. Get help right away if:  You have a fever.  You are leaking fluid from your vagina.  You have spotting or bleeding from your vagina.  You have severe abdominal cramping or pain.  You have rapid weight gain or loss.  You vomit blood or material that looks like coffee grounds.  You develop a severe headache.  You have shortness of breath.  You have any kind of trauma, such as from a fall or a car accident. Summary  The first trimester of pregnancy is from week 1 until the end of week 13 (months 1 through 3).  Your body goes through many changes during pregnancy. The changes vary from  woman to woman.  You will have routine prenatal visits. During those visits, your health care provider will examine you, discuss any test results you may have, and talk with you about how you are feeling. This information is not intended to replace advice given to you by your health care provider. Make sure you discuss any questions you have with your health care provider. Document Released: 11/16/2001 Document Revised: 11/03/2016 Document Reviewed: 11/03/2016 Elsevier Interactive Patient Education  2018 Elsevier   Inc.  

## 2018-10-10 LAB — GC/CHLAMYDIA PROBE AMP (~~LOC~~) NOT AT ARMC
Chlamydia: NEGATIVE
Neisseria Gonorrhea: NEGATIVE

## 2018-10-11 LAB — CULTURE, OB URINE

## 2018-10-11 LAB — URINE CULTURE, OB REFLEX: Organism ID, Bacteria: NO GROWTH

## 2018-10-16 LAB — OBSTETRIC PANEL, INCLUDING HIV
ANTIBODY SCREEN: NEGATIVE
BASOS ABS: 0 10*3/uL (ref 0.0–0.2)
Basos: 0 %
EOS (ABSOLUTE): 0.2 10*3/uL (ref 0.0–0.4)
Eos: 2 %
HEMATOCRIT: 36.9 % (ref 34.0–46.6)
HEMOGLOBIN: 12.3 g/dL (ref 11.1–15.9)
HEP B S AG: NEGATIVE
HIV SCREEN 4TH GENERATION: NONREACTIVE
Immature Grans (Abs): 0 10*3/uL (ref 0.0–0.1)
Immature Granulocytes: 0 %
LYMPHS ABS: 2 10*3/uL (ref 0.7–3.1)
Lymphs: 18 %
MCH: 28.5 pg (ref 26.6–33.0)
MCHC: 33.3 g/dL (ref 31.5–35.7)
MCV: 86 fL (ref 79–97)
MONOCYTES: 5 %
Monocytes Absolute: 0.6 10*3/uL (ref 0.1–0.9)
NEUTROS ABS: 8.1 10*3/uL — AB (ref 1.4–7.0)
Neutrophils: 75 %
PLATELETS: 272 10*3/uL (ref 150–450)
RBC: 4.31 x10E6/uL (ref 3.77–5.28)
RDW: 14 % (ref 12.3–15.4)
RH TYPE: NEGATIVE
RPR: NONREACTIVE
RUBELLA: 2.85 {index} (ref >0.99)
WBC: 10.9 10*3/uL — AB (ref 3.4–10.8)

## 2018-10-16 LAB — COMPREHENSIVE METABOLIC PANEL
A/G RATIO: 1.4 (ref 1.2–2.2)
ALK PHOS: 58 IU/L (ref 39–117)
ALT: 12 IU/L (ref 0–32)
AST: 14 IU/L (ref 0–40)
Albumin: 4.1 g/dL (ref 3.5–5.5)
BUN / CREAT RATIO: 12 (ref 9–23)
BUN: 9 mg/dL (ref 6–20)
Bilirubin Total: <0.2 mg/dL (ref 0.0–1.2)
CO2: 20 mmol/L (ref 20–29)
CREATININE: 0.77 mg/dL (ref 0.57–1.00)
Calcium: 9.7 mg/dL (ref 8.7–10.2)
Chloride: 97 mmol/L (ref 96–106)
GFR calc Af Amer: 129 mL/min/{1.73_m2} (ref >59)
GFR calc non Af Amer: 112 mL/min/{1.73_m2} (ref >59)
GLOBULIN, TOTAL: 2.9 g/dL (ref 1.5–4.5)
Glucose: 78 mg/dL (ref 65–99)
Potassium: 3.8 mmol/L (ref 3.5–5.2)
SODIUM: 136 mmol/L (ref 134–144)
Total Protein: 7 g/dL (ref 6.0–8.5)

## 2018-10-16 LAB — SMN1 COPY NUMBER ANALYSIS (SMA CARRIER SCREENING)

## 2018-10-16 LAB — HEMOGLOBINOPATHY EVALUATION
FERRITIN: 38 ng/mL (ref 15–77)
HGB A2 QUANT: 2.5 % (ref 1.8–3.2)
HGB F QUANT: 0 % (ref 0.0–2.0)
Hgb A: 97.5 % (ref 96.4–98.8)
Hgb C: 0 %
Hgb S: 0 %
Hgb Solubility: NEGATIVE
Hgb Variant: 0 %

## 2018-10-16 LAB — HEMOGLOBIN A1C
ESTIMATED AVERAGE GLUCOSE: 105 mg/dL
HEMOGLOBIN A1C: 5.3 % (ref 4.8–5.6)

## 2018-10-16 LAB — CYSTIC FIBROSIS GENE TEST

## 2018-10-27 DIAGNOSIS — R1110 Vomiting, unspecified: Secondary | ICD-10-CM | POA: Diagnosis not present

## 2018-10-27 DIAGNOSIS — B349 Viral infection, unspecified: Secondary | ICD-10-CM | POA: Diagnosis not present

## 2018-11-06 DIAGNOSIS — R51 Headache: Secondary | ICD-10-CM | POA: Diagnosis not present

## 2018-11-08 ENCOUNTER — Inpatient Hospital Stay (HOSPITAL_COMMUNITY)
Admission: AD | Admit: 2018-11-08 | Discharge: 2018-11-08 | Disposition: A | Discharge status: Home / Self Care | Payer: BLUE CROSS/BLUE SHIELD | Source: Ambulatory Visit | Attending: Family Medicine | Admitting: Family Medicine

## 2018-11-08 ENCOUNTER — Ambulatory Visit (INDEPENDENT_AMBULATORY_CARE_PROVIDER_SITE_OTHER): Payer: BLUE CROSS/BLUE SHIELD | Admitting: Family Medicine

## 2018-11-08 ENCOUNTER — Encounter (HOSPITAL_COMMUNITY): Payer: Self-pay

## 2018-11-08 VITALS — BP 130/79 | HR 94 | Wt 211.4 lb

## 2018-11-08 DIAGNOSIS — K5901 Slow transit constipation: Secondary | ICD-10-CM

## 2018-11-08 DIAGNOSIS — R1032 Left lower quadrant pain: Secondary | ICD-10-CM | POA: Insufficient documentation

## 2018-11-08 DIAGNOSIS — Z3481 Encounter for supervision of other normal pregnancy, first trimester: Secondary | ICD-10-CM | POA: Diagnosis not present

## 2018-11-08 DIAGNOSIS — Z86711 Personal history of pulmonary embolism: Secondary | ICD-10-CM

## 2018-11-08 DIAGNOSIS — O0991 Supervision of high risk pregnancy, unspecified, first trimester: Secondary | ICD-10-CM

## 2018-11-08 DIAGNOSIS — Z3A12 12 weeks gestation of pregnancy: Secondary | ICD-10-CM | POA: Diagnosis not present

## 2018-11-08 DIAGNOSIS — O99611 Diseases of the digestive system complicating pregnancy, first trimester: Secondary | ICD-10-CM | POA: Diagnosis not present

## 2018-11-08 DIAGNOSIS — K5900 Constipation, unspecified: Secondary | ICD-10-CM | POA: Diagnosis not present

## 2018-11-08 DIAGNOSIS — O26891 Other specified pregnancy related conditions, first trimester: Secondary | ICD-10-CM | POA: Diagnosis not present

## 2018-11-08 DIAGNOSIS — O0990 Supervision of high risk pregnancy, unspecified, unspecified trimester: Secondary | ICD-10-CM

## 2018-11-08 LAB — URINALYSIS, ROUTINE W REFLEX MICROSCOPIC
Bilirubin Urine: NEGATIVE
Glucose, UA: NEGATIVE mg/dL
Hgb urine dipstick: NEGATIVE
Ketones, ur: NEGATIVE mg/dL
NITRITE: NEGATIVE
Protein, ur: NEGATIVE mg/dL
Specific Gravity, Urine: 1.028 (ref 1.005–1.030)
pH: 7 (ref 5.0–8.0)

## 2018-11-08 MED ORDER — SIMETHICONE 80 MG PO CHEW
80.0000 mg | CHEWABLE_TABLET | Freq: Once | ORAL | Status: AC
  Administered 2018-11-08: 80 mg via ORAL
  Filled 2018-11-08: qty 1

## 2018-11-08 MED ORDER — POLYETHYLENE GLYCOL 3350 17 G PO PACK: 17.0000 g | PACK | Freq: Every day | ORAL | 0 refills | Status: DC

## 2018-11-08 MED ORDER — SENNA-DOCUSATE SODIUM 8.6-50 MG PO TABS: 1.0000 | ORAL_TABLET | Freq: Every day | ORAL | 2 refills | Status: DC

## 2018-11-08 NOTE — MAU Provider Note (Signed)
History     CSN: [id number]  Arrival date and time: 11/08/18 4098   First Provider Initiated Contact with Patient 11/08/18 0341     Chief Complaint  Patient presents with  . Abdominal Pain   HPI Alexis Richardson is a 17 y.o. G1P0000 at [redacted]w[redacted]d who presents with left lower abdominal pain that started at 2200 after eating cheese dip. She states she feels pressure "like I need to pass gass." She reports a history of constipation in the pregnancy with her last bowel movement being 12/2 but was difficult to pass. She denies any bleeding or discharge.   OB History    Gravida  1   Para  0   Term  0   Preterm  0   AB  0   Living  0     SAB  0   TAB  0   Ectopic  0   Multiple  0   Live Births  0           Past Medical History:  Diagnosis Date  . Pulmonary embolism (HCC)    a. Dx 09/2017, had IUD removed in case this contributed.  . Right ventricular dilation    a. at time of PE 09/2017.    Past Surgical History:  Procedure Laterality Date  . KELOID EXCISION      Family History  Problem Relation Age of Onset  . Hypertension Mother   . Hypertension Maternal Aunt   . Cancer Maternal Grandfather   . Pulmonary embolism Neg Hx   . Heart disease Neg Hx     Social History   Tobacco Use  . Smoking status: Never Smoker  . Smokeless tobacco: Never Used  Substance Use Topics  . Alcohol use: No  . Drug use: Not Currently    Types: Marijuana    Comment: last mid-Oct    Allergies: No Known Allergies  Medications Prior to Admission  Medication Sig Dispense Refill Last Dose  . enoxaparin (LOVENOX) 40 MG/0.4ML injection Inject 0.4 mLs (40 mg total) into the skin daily. 90 Syringe 4 11/07/2018 at Unknown time  . Prenatal Multivit-Min-Fe-FA (PRENATAL VITAMINS) 0.8 MG tablet Take 1 tablet by mouth daily. 30 tablet 12 Taking    Review of Systems  Constitutional: Negative.  Negative for fatigue and fever.  HENT: Negative.   Respiratory: Negative.  Negative for  shortness of breath.   Cardiovascular: Negative.  Negative for chest pain.  Gastrointestinal: Positive for abdominal pain and constipation. Negative for diarrhea, nausea and vomiting.  Genitourinary: Negative.  Negative for dysuria, vaginal bleeding and vaginal discharge.  Neurological: Negative.  Negative for dizziness and headaches.   Physical Exam   Blood pressure 132/69, pulse (!) 102, temperature 98 F (36.7 C), temperature source Oral, resp. rate 20, height 5\' 3"  (1.6 m), weight 96.6 kg, last menstrual period 08/13/2018.  Physical Exam  Nursing note and vitals reviewed. Constitutional: She is oriented to person, place, and time. She appears well-developed and well-nourished. No distress.  HENT:  Head: Normocephalic.  Eyes: Pupils are equal, round, and reactive to light.  Cardiovascular: Normal rate, regular rhythm and normal heart sounds.  Respiratory: Effort normal and breath sounds normal. No respiratory distress.  GI: Soft. Bowel sounds are normal. She exhibits no distension. There is no tenderness.  Neurological: She is alert and oriented to person, place, and time.  Skin: Skin is warm and dry.  Psychiatric: She has a normal mood and affect. Her behavior is normal. Judgment and thought  content normal.   Dilation: Closed Effacement (%): Thick Cervical Position: Posterior Exam by:: Ma Hillock. Chinyere Galiano CNM  FHT: 160 bpm  MAU Course  Procedures Results for orders placed or performed during the hospital encounter of 11/08/18 (from the past 24 hour(s))  Urinalysis, Routine w reflex microscopic     Status: Abnormal   Collection Time: 11/08/18  3:57 AM  Result Value Ref Range   Color, Urine YELLOW YELLOW   APPearance CLOUDY (A) CLEAR   Specific Gravity, Urine 1.028 1.005 - 1.030   pH 7.0 5.0 - 8.0   Glucose, UA NEGATIVE NEGATIVE mg/dL   Hgb urine dipstick NEGATIVE NEGATIVE   Bilirubin Urine NEGATIVE NEGATIVE   Ketones, ur NEGATIVE NEGATIVE mg/dL   Protein, ur NEGATIVE NEGATIVE  mg/dL   Nitrite NEGATIVE NEGATIVE   Leukocytes, UA TRACE (A) NEGATIVE   RBC / HPF 0-5 0 - 5 RBC/hpf   WBC, UA 0-5 0 - 5 WBC/hpf   Bacteria, UA RARE (A) NONE SEEN   Squamous Epithelial / LPF 11-20 0 - 5   MDM UA Simethicone- patient reports some relief CBC- patient refused  Assessment and Plan   1. Constipation in pregnancy in first trimester   2. [redacted] weeks gestation of pregnancy    -Discharge home in stable condition -OTC stool softeners and poop clean out instructions reviewed with patient -Encouraged patient to increase PO hydration -[facility] -Patient may return to MAU as needed or if her condition were to change or worsen   Rolm BookbinderCaroline M Marlys Stegmaier CNM 11/08/2018, 5:26 AM

## 2018-11-08 NOTE — Discharge Instructions (Signed)
Constipation/Bowel Prep ° °You have constipation which is hard stools that are difficult to pass. It is important to have regular bowel movements every 1-3 days that are soft and easy to pass. Hard stools increase your risk of hemorrhoids and are very uncomfortable.  ° °To prevent constipation you can increase the amount of fiber in your diet. Examples of foods with fiber are leafy greens, whole grain breads, oatmeal and other grains.  It is also important to drink at least eight 8oz glass of water everyday.  ° °If you have not has a bowel movement in 4-5 days you made need to clean out your bowel.  This will have establish normal movement through your bowel.   ° °Miralax Clean out °Take 8 capfuls of miralax in 64 oz of gatorade. You can use any fluid that appeals to you (gatorade, water, juice) °Continue to drink at least eight 8 oz glasses of water throughout the day °You can repeat with another 8 capfuls of miralax in 64 oz of gatorade if you are not having a large amount of stools °You will need to be at home and close to a bathroom for about 8 hours when you do the above as you may need to go to the bathroom frequently.  ° °After you are cleaned out: °- Start Colace100mg twice daily °- Start Miralax once daily °- Start a daily fiber supplement like metamucil or citrucel °- You can safely use enemas in pregnancy  °- if you are having diarrhea you can reduce to Colace once a day or miralax every other day or a 1/2 capful daily.  ° °Safe Medications in Pregnancy  ° °Acne: °Benzoyl Peroxide °Salicylic Acid ° °Backache/Headache: °Tylenol: 2 regular strength every 4 hours OR °             2 Extra strength every 6 hours ° °Colds/Coughs/Allergies: °Benadryl (alcohol free) 25 mg every 6 hours as needed °Breath right strips °Claritin °Cepacol throat lozenges °Chloraseptic throat spray °Cold-Eeze- up to three times per day °Cough drops, alcohol free °Flonase (by prescription only) °Guaifenesin °Mucinex °Robitussin DM  (plain only, alcohol free) °Saline nasal spray/drops °Sudafed (pseudoephedrine) & Actifed ** use only after [redacted] weeks gestation and if you do not have high blood pressure °Tylenol °Vicks Vaporub °Zinc lozenges °Zyrtec  ° °Constipation: °Colace °Ducolax suppositories °Fleet enema °Glycerin suppositories °Metamucil °Milk of magnesia °Miralax °Senokot °Smooth move tea ° °Diarrhea: °Kaopectate °Imodium A-D ° °*NO pepto Bismol ° °Hemorrhoids: °Anusol °Anusol HC °Preparation H °Tucks ° °Indigestion: °Tums °Maalox °Mylanta °Zantac  °Pepcid ° °Insomnia: °Benadryl (alcohol free) 25mg every 6 hours as needed °Tylenol PM °Unisom, no Gelcaps ° °Leg Cramps: °Tums °MagGel ° °Nausea/Vomiting:  °Bonine °Dramamine °Emetrol °Ginger extract °Sea bands °Meclizine  °Nausea medication to take during pregnancy:  °Unisom (doxylamine succinate 25 mg tablets) Take one tablet daily at bedtime. If symptoms are not adequately controlled, the dose can be increased to a maximum recommended dose of two tablets daily (1/2 tablet in the morning, 1/2 tablet mid-afternoon and one at bedtime). °Vitamin B6 100mg tablets. Take one tablet twice a day (up to 200 mg per day). ° °Skin Rashes: °Aveeno products °Benadryl cream or 25mg every 6 hours as needed °Calamine Lotion °1% cortisone cream ° °Yeast infection: °Gyne-lotrimin 7 °Monistat 7 ° ° °**If taking multiple medications, please check labels to avoid duplicating the same active ingredients °**take medication as directed on the label °** Do not exceed 4000 mg of tylenol in 24 hours °**Do not   take medications that contain aspirin or ibuprofen ° ° ° °

## 2018-11-08 NOTE — Progress Notes (Signed)
Anatomy ultrasound scheduled for January 20th at 1400.

## 2018-11-08 NOTE — Patient Instructions (Addendum)
Genetic Screening Results Information: You are having genetic testing called [facility] today.  It will take approximately 2 weeks before the results are available.  To get your results, you need Internet access to a web browser to search [place]/MyChart (the direct app on your phone will not give you these results).  Then select Lab Scanned and click on the blue hyper link that says View Image to see your [facility] results.  You can also use the directions on the purple card given to look up your results directly on the [place] website.   Childbirth Education Options: [facility] Classes:  Childbirth education classes can help you get ready for a positive parenting experience. You can also meet other expectant parents and get free stuff for your baby. Each class runs for five weeks on the same night and costs $45 for the mother-to-be and her support person. Medicaid covers the cost if you are eligible. Call [phone] to register. [facility] Childbirth Education:  [phone] or [phone] or [email or web address]  Baby & Me Class: Discuss newborn & infant parenting and family adjustment issues with other new mothers in a relaxed environment. Each week brings a new speaker or baby-centered activity. We encourage new mothers to join [place] every Thursday at 11:00am. Babies birth until crawling. No registration or fee. Daddy [employer]: This course offers Dads-to-be the tools and knowledge needed to feel confident on their journey to becoming new fathers. Experienced dads, who have been trained as coaches, teach dads-to-be how to hold, comfort, diaper, swaddle and play with their infant while being able to support the new mom as well. A class for men taught by men. $25/dad Big Brother/Big Sister: Let your children share in the joy of a new brother or sister in this special class designed just for them. Class includes discussion about how families care for babies: swaddling,  holding, diapering, safety as well as how they can be helpful in their new role. This class is designed for children ages 32 to 103, but any age is welcome. Please register each child individually. $5/child  Mom Talk: This mom-led group offers support and connection to mothers as they journey through the adjustments and struggles of that sometimes overwhelming first year after the birth of a child. Tuesdays at 10:00am and Thursdays at 6:00pm. Babies welcome. No registration or fee. Breastfeeding Support Group: This group is a mother-to-mother support circle where moms have the opportunity to share their breastfeeding experiences. A Lactation Consultant is present for questions and concerns. Meets each Tuesday at 11:00am. No fee or registration. Breastfeeding Your Baby: Learn what to expect in the first days of breastfeeding your newborn.  This class will help you feel more confident with the skills needed to begin your breastfeeding experience. Many new mothers are concerned about breastfeeding after leaving the hospital. This class will also address the most common fears and challenges about breastfeeding during the first few weeks, months and beyond. (call for fee) Comfort Techniques and Tour: This 2 hour interactive class will provide you the opportunity to learn & practice hands-on techniques that can help relieve some of the discomfort of labor and encourage your baby to rotate toward the best position for birth. You and your partner will be able to try a variety of labor positions with birth balls and rebozos as well as practice breathing, relaxation, and visualization techniques. A tour of the [facility] is included with this class. $20 per registrant and support person Childbirth Class-  Weekend Option: This class is a Weekend version of our Birth & Baby series. It is designed for parents who have a difficult time fitting several weeks of classes into their schedule. It covers  the care of your newborn and the basics of labor and childbirth. It also includes a [place] [facility] and lunch. The class is held two consecutive days: beginning on Friday evening from 6:30 - 8:30 p.m. and the next day, Saturday from 9 a.m. - 4 p.m. (call for fee) Doren Custard Class: Interested in a waterbirth?  This informational class will help you discover whether waterbirth is the right fit for you. Education about waterbirth itself, supplies you would need and how to assemble your support team is what you can expect from this class. Some obstetrical practices require this class in order to pursue a waterbirth. (Not all obstetrical practices offer waterbirth-check with your healthcare provider.) Register only the expectant mom, but you are encouraged to bring your partner to class! Required if planning waterbirth, no fee. Infant/Child CPR: Parents, grandparents, babysitters, and friends learn Cardio-Pulmonary Resuscitation skills for infants and children. You will also learn how to treat both conscious and unconscious choking in infants and children. This Family & Friends program does not offer certification. Register each participant individually to ensure that enough mannequins are available. (Call for fee) Grandparent Love: Expecting a grandbaby? This class is for you! Learn about the latest infant care and safety recommendations and ways to support your own child as he or she transitions into the parenting role. Taught by Registered Nurses who are childbirth instructors, but most importantly...they are grandmothers too! $10/person. Childbirth Class- Natural Childbirth: This series of 5 weekly classes is for expectant parents who want to learn and practice natural methods of coping with the process of labor and childbirth. Relaxation, breathing, massage, visualization, role of the partner, and helpful positioning are highlighted. Participants learn how to be confident in their  body's ability to give birth. This class will empower and help parents make informed decisions about their own care. Includes discussion that will help new parents transition into the immediate postpartum period. [facility] is included. We suggest taking this class between 25-32 weeks, but it's only a recommendation. $75 per registrant and one support person or $30 Medicaid. Childbirth Class- 3 week Series: This option of 3 weekly classes helps you and your labor partner prepare for childbirth. Newborn care, labor & birth, cesarean birth, pain management, and comfort techniques are discussed and a [place] [facility] is included. The class meets at the same time, on the same day of the week for 3 consecutive weeks beginning with the starting date you choose. $60 for registrant and one support person.  Marvelous Multiples: Expecting twins, triplets, or more? This class covers the differences in labor, birth, parenting, and breastfeeding issues that face multiples' parents. NICU tour is included. Led by a Certified Childbirth Educator who is the mother of twins. No fee. Caring for Baby: This class is for expectant and adoptive parents who want to learn and practice the most up-to-date newborn care for their babies. Focus is on birth through the first six weeks of life. Topics include feeding, bathing, diapering, crying, umbilical cord care, circumcision care and safe sleep. Parents learn to recognize symptoms of illness and when to call the pediatrician. Register only the mom-to-be and your partner or support person can plan to come with you! $10 per registrant  and support person Childbirth Class- online option: This online class offers you the freedom to complete a Birth and Baby series in the comfort of your own home. The flexibility of this option allows you to review sections at your own pace, at times convenient to you and your support people. It  includes additional video information, animations, quizzes, and extended activities. Get organized with helpful eClass tools, checklists, and trackers. Once you register online for the class, you will receive an email within a few days to accept the invitation and begin the class when the time is right for you. The content will be available to you for 60 days. $60 for 60 days of online access for you and your support people.  Local Doulas: Natural Baby Doulas [email or web address] Tel: [phone] [email or web address] [employer] [phone] [email or web address] The Labor Hassell Halim  (also do waterbirth tub rental) [phone] [email or web address] Triad Birth Doula [phone] [email or web address].fi [facility] Rhythms  [phone] [email or web address] [employer] (PADA) [email or web address] [place] Birth and Baby  [email or web address] Considering Waterbirth? Guide for patients at [facility] [employer]  Why consider waterbirth?  . Gentle birth for babies . Less pain medicine used in labor . May allow for passive descent/less pushing . May reduce perineal tears  . More mobility and instinctive maternal position changes . Increased maternal relaxation . Reduced blood pressure in labor  Is waterbirth safe? What are the risks of infection, drowning or other complications?  . Infection: o Very low risk (3.7 % for tub vs 4.8% for bed) o 7 in 8000 waterbirths with documented infection o Poorly cleaned equipment most common cause o Slightly lower group B strep transmission rate  . Drowning o Maternal:  - Very low risk   - Related to seizures or fainting o Newborn:  - Very low risk. No evidence of increased risk of respiratory problems in multiple large  studies - Physiological protection from breathing under water - Avoid underwater birth if there are any fetal complications - Once baby's head is out of the water, keep it out.  . Birth complication o Some reports of cord trauma, but risk decreased by bringing baby to surface gradually o No evidence of increased risk of shoulder dystocia. Mothers can usually change positions faster in water than in a bed, possibly aiding the maneuvers to free the shoulder.   You must attend a Doren Custard class at [facility]  3rd Wednesday of every month from 7-9pm  [employer] by calling [phone] or online at [email or web address]  Bring [place] the certificate from the class to your prenatal appointment  Meet with a midwife at 36 weeks to see if you can still plan a waterbirth and to sign the consent.   Purchase or rent the following supplies:   Water Birth Pool (Birth Pool in a Box or [place] for instance)  (Tubs start ~$125)  Single-use disposable tub liner designed for your brand of tub  New garden hose labeled "lead-free", "suitable for drinking water",  Electric drain pump to remove water (We recommend 792 gallon per hour or greater pump.)   Separate garden hose to remove the dirty water  Fish net  Bathing suit top (optional)  Long-handled mirror (optional)  Places to purchase or rent supplies  GotWebTools.is for tub purchases and supplies  Waterbirthsolutions.com for tub purchases and supplies  The Labor Ladies (www.[email or web address]) $275 for tub rental/set-up & take down/kit   [employer] (http://www.fleming.com/.htm) Information regarding doulas (labor support) who provide pool rentals  Our practice has a Heritage manager in a Box tub at the hospital that you may borrow on a first-come-first-served basis. It is your responsibility to to set up, clean and break down the tub. We cannot guarantee the availability of this tub in advance. You  are responsible for bringing all accessories listed above. If you do not have all necessary supplies you cannot have a waterbirth.    Things that would prevent you from having a waterbirth:  Premature, <37wks  Previous cesarean birth  Presence of thick meconium-stained fluid  Multiple gestation (Twins, triplets, etc.)  Uncontrolled diabetes or gestational diabetes requiring medication  Hypertension requiring medication or diagnosis of pre-eclampsia  Heavy vaginal bleeding  Non-reassuring fetal heart rate  Active infection (MRSA, etc.). Group B Strep is NOT a contraindication for  waterbirth.  If your labor has to be induced and induction method requires continuous  monitoring of the baby's heart rate  Other risks/issues identified by your obstetrical provider  Please remember that birth is unpredictable. Under certain unforeseeable circumstances your provider may advise against giving birth in the tub. These decisions will be made on a case-by-case basis and with the safety of you and your baby as our highest priority.    [facility]ILY [place] [address] [place], [place]  [address] Phone - [phone]   Fax - [phone]  [facility] [address] [place], [place] [address] Phone - [phone]   Fax - [place] [address], [place]  [address] Phone - [phone]   Fax - [phone]  [place]. [address], [place] [address] [place], [place]  [address] Phone - [phone]   Fax - [phone]  [place] [address] [place], [place]  [address] Phone - [phone]   Fax - [phone]  [facility] [address] [place], [place]  [address] Phone - [phone]   Fax - [place] [address] [place], [place]  [address] Phone - [phone]    Fax - [phone]  [place] [address] [place], [place] [address] [place], [place]  [address] Phone - [phone]   Fax - [place] [address] [place], [place]  [address] Phone - [phone]   Fax - [phone] [facility] [place]. [address] [place], [place]  [address] Phone - [phone]   Fax - [phone]  [place] [address] [place], [place]  [address] Phone - [phone]   Fax - [phone]  [facility] AT [place], [place], [place]  [address] Phone - [phone]   Fax - [place] [address], [place], [place]  [address] Phone - [phone]   Fax - [phone]  [facility] [place] [address] [place], [place]  [address] Phone - [place], [place]  [address] Phone - [phone]   Fax - [place] [address], [place], [place]  [address] Phone - [phone]   Fax - [phone]  [place] [address] [place], [place]  [address] Phone - [phone]   Fax - [place]. [address] [place],   [address] Phone - [phone]  Fax - [phone]   [place], [place], [place]  [address] Phone - [phone]   Fax - [place] [address] [place],   [address] Phone - [phone]   Fax - [phone]  [place] [address] [place], [place]  [address] Phone - [phone]   Fax - [place] [address] [place], [place]  [address] Phone - [phone]   Fax - [phone]  [place] [address] [place], [place]   [address] Phone - [phone]   Fax - [phone] [place] [address] [place], [place]  [address] Phone - [phone]   Fax - [phone]  [place] [address] [place], [place]  [address] Phone - [phone]   Fax - [place] [address], [place], [place]  [address] Phone - [phone]   Fax - [phone]  [place] [address] [place] [address] Phone [phone]  Fax [phone]   Breastfeeding Choosing to breastfeed is one of the best decisions you can make for yourself and your baby. A change in hormones during pregnancy causes your breasts to make breast milk in your milk-producing glands. Hormones prevent breast milk from being released before your baby is born. They also prompt milk flow after birth. Once breastfeeding has begun, thoughts of your baby, as well as his or her sucking or crying, can stimulate the release of milk from your milk-producing glands. Benefits of breastfeeding Research shows that breastfeeding offers many health benefits for infants and mothers. It also offers a cost-free and convenient way to feed your baby. For your baby  Your first milk (colostrum) helps your baby's digestive system to function better.  Special cells in your milk (antibodies) help your baby to fight off infections.  Breastfed babies are less likely to develop asthma, allergies, obesity, or type 2 diabetes. They are also at lower risk for sudden infant death syndrome (SIDS).  Nutrients in breast milk are better able to meet your baby's needs compared to infant formula.  Breast milk improves your baby's brain development. For you  Breastfeeding helps to create a very special bond between you and your baby.  Breastfeeding is convenient. Breast milk costs nothing and is always available at the correct  temperature.  Breastfeeding helps to burn calories. It helps you to lose the weight that you gained during pregnancy.  Breastfeeding makes your uterus return faster to its size before pregnancy. It also slows bleeding (lochia) after you give birth.  Breastfeeding helps to lower your risk of developing type 2 diabetes, osteoporosis, rheumatoid arthritis, cardiovascular disease, and breast, ovarian, uterine, and endometrial cancer later in life. Breastfeeding basics Starting breastfeeding  Find a comfortable place to sit or lie down, with your neck and back well-supported.  Place a pillow or a rolled-up blanket under your baby to bring him or her to the level of your breast (if you are seated). Nursing pillows are specially designed to help support your arms and your baby while you breastfeed.  Make sure that your baby's tummy (abdomen) is facing your abdomen.  Gently massage your breast. With your fingertips, massage from the outer edges of your breast inward toward the nipple. This encourages milk flow. If your milk flows slowly, you may need to continue this action during the feeding.  Support your breast with 4 fingers underneath and your thumb above your nipple (make the letter "C" with your hand). Make sure your fingers are well away from your nipple and your baby's mouth.  Stroke your baby's lips gently with your finger or nipple.  When your baby's mouth is open wide enough, quickly bring your baby to your breast, placing your entire nipple and as much of the areola as possible into your baby's mouth. The areola is the colored area around your nipple. ? More areola should be visible above your baby's upper lip than below the lower lip. ? Your baby's lips should be opened and extended outward (flanged) to ensure an adequate, comfortable latch. ? Your baby's tongue should be between his or her lower gum and your breast.  Make sure that your baby's mouth is correctly positioned around  your nipple (latched). Your baby's lips should create a seal on your breast and be turned out (everted).  It is common for your baby to suck about 2-3 minutes in order to start the flow of breast milk. Latching Teaching your baby how to latch onto your breast properly is very important. An improper latch can cause nipple pain, decreased milk supply, and poor weight gain in your baby. Also, if your baby is not latched onto your nipple properly, he or she may swallow some air during feeding. This can make your baby fussy. Burping your baby when you switch breasts during the feeding can help to get rid of the air. However, teaching your baby to latch on properly is still the best way to prevent fussiness from swallowing air while breastfeeding. Signs that your baby has successfully latched onto your nipple  Silent tugging or silent sucking, without causing you pain. Infant's lips should be extended outward (flanged).  Swallowing heard between every 3-4 sucks once your milk has started to flow (after your let-down milk reflex occurs).  Muscle movement above and in front of his or her ears while sucking.  Signs that your baby has not successfully latched onto your nipple  Sucking sounds or smacking sounds from your baby while breastfeeding.  Nipple pain.  If you think your baby has not latched on correctly, slip your finger into the corner of your baby's mouth to break the suction and place it between your baby's gums. Attempt to start breastfeeding again. Signs of successful breastfeeding Signs from your baby  Your baby will gradually decrease the number of sucks or will completely stop sucking.  Your baby will fall asleep.  Your baby's body will relax.  Your baby will retain a small amount of milk in his or her mouth.  Your baby will let go of your breast by himself or herself.  Signs from you  Breasts that have increased in firmness, weight, and size 1-3 hours after  feeding.  Breasts that are softer immediately after breastfeeding.  Increased milk volume, as well as a change in milk consistency and color by the fifth day of breastfeeding.  Nipples that are not sore, cracked, or bleeding.  Signs that your baby is getting enough milk  Wetting at least 1-2 diapers during the first 24 hours after birth.  Wetting at least 5-6 diapers every 24 hours for the first week after birth. The urine should be clear or pale yellow by the age of 5 days.  Wetting 6-8 diapers every 24 hours as your baby continues to grow and develop.  At least 3 stools in a 24-hour period by  the age of 5 days. The stool should be soft and yellow.  At least 3 stools in a 24-hour period by the age of 7 days. The stool should be seedy and yellow.  No loss of weight greater than 10% of birth weight during the first 3 days of life.  Average weight gain of 4-7 oz (113-198 g) per week after the age of 4 days.  Consistent daily weight gain by the age of 5 days, without weight loss after the age of 2 weeks. After a feeding, your baby may spit up a small amount of milk. This is normal. Breastfeeding frequency and duration Frequent feeding will help you make more milk and can prevent sore nipples and extremely full breasts (breast engorgement). Breastfeed when you feel the need to reduce the fullness of your breasts or when your baby shows signs of hunger. This is called "breastfeeding on demand." Signs that your baby is hungry include:  Increased alertness, activity, or restlessness.  Movement of the head from side to side.  Opening of the mouth when the corner of the mouth or cheek is stroked (rooting).  Increased sucking sounds, smacking lips, cooing, sighing, or squeaking.  Hand-to-mouth movements and sucking on fingers or hands.  Fussing or crying.  Avoid introducing a pacifier to your baby in the first 4-6 weeks after your baby is born. After this time, you may choose to use a  pacifier. Research has shown that pacifier use during the first year of a baby's life decreases the risk of sudden infant death syndrome (SIDS). Allow your baby to feed on each breast as long as he or she wants. When your baby unlatches or falls asleep while feeding from the first breast, offer the second breast. Because newborns are often sleepy in the first few weeks of life, you may need to awaken your baby to get him or her to feed. Breastfeeding times will vary from baby to baby. However, the following rules can serve as a guide to help you make sure that your baby is properly fed:  Newborns (babies 66 weeks of age or younger) may breastfeed every 1-3 hours.  Newborns should not go without breastfeeding for longer than 3 hours during the day or 5 hours during the night.  You should breastfeed your baby a minimum of 8 times in a 24-hour period.  Breast milk pumping Pumping and storing breast milk allows you to make sure that your baby is exclusively fed your breast milk, even at times when you are unable to breastfeed. This is especially important if you go back to work while you are still breastfeeding, or if you are not able to be present during feedings. Your lactation consultant can help you find a method of pumping that works best for you and give you guidelines about how long it is safe to store breast milk. Caring for your breasts while you breastfeed Nipples can become dry, cracked, and sore while breastfeeding. The following recommendations can help keep your breasts moisturized and healthy:  Avoid using soap on your nipples.  Wear a supportive bra designed especially for nursing. Avoid wearing underwire-style bras or extremely tight bras (sports bras).  Air-dry your nipples for 3-4 minutes after each feeding.  Use only cotton bra pads to absorb leaked breast milk. Leaking of breast milk between feedings is normal.  Use lanolin on your nipples after breastfeeding. Lanolin helps to  maintain your skin's normal moisture barrier. Pure lanolin is not harmful (not toxic) to  your baby. You may also hand express a few drops of breast milk and gently massage that milk into your nipples and allow the milk to air-dry.  In the first few weeks after giving birth, some women experience breast engorgement. Engorgement can make your breasts feel heavy, warm, and tender to the touch. Engorgement peaks within 3-5 days after you give birth. The following recommendations can help to ease engorgement:  Completely empty your breasts while breastfeeding or pumping. You may want to start by applying warm, moist heat (in the shower or with warm, water-soaked hand towels) just before feeding or pumping. This increases circulation and helps the milk flow. If your baby does not completely empty your breasts while breastfeeding, pump any extra milk after he or she is finished.  Apply ice packs to your breasts immediately after breastfeeding or pumping, unless this is too uncomfortable for you. To do this: ? Put ice in a plastic bag. ? Place a towel between your skin and the bag. ? Leave the ice on for 20 minutes, 2-3 times a day.  Make sure that your baby is latched on and positioned properly while breastfeeding.  If engorgement persists after 48 hours of following these recommendations, contact your health care provider or a Science writer. Overall health care recommendations while breastfeeding  Eat 3 healthy meals and 3 snacks every day. Well-nourished mothers who are breastfeeding need an additional 450-500 calories a day. You can meet this requirement by increasing the amount of a balanced diet that you eat.  Drink enough water to keep your urine pale yellow or clear.  Rest often, relax, and continue to take your prenatal vitamins to prevent fatigue, stress, and low vitamin and mineral levels in your body (nutrient deficiencies).  Do not use any products that contain nicotine or tobacco,  such as cigarettes and e-cigarettes. Your baby may be harmed by chemicals from cigarettes that pass into breast milk and exposure to secondhand smoke. If you need help quitting, ask your health care provider.  Avoid alcohol.  Do not use illegal drugs or marijuana.  Talk with your health care provider before taking any medicines. These include over-the-counter and prescription medicines as well as vitamins and herbal supplements. Some medicines that may be harmful to your baby can pass through breast milk.  It is possible to become pregnant while breastfeeding. If birth control is desired, ask your health care provider about options that will be safe while breastfeeding your baby. Where to find more information: [employer]: www.llli.org Contact a health care provider if:  You feel like you want to stop breastfeeding or have become frustrated with breastfeeding.  Your nipples are cracked or bleeding.  Your breasts are red, tender, or warm.  You have: ? Painful breasts or nipples. ? A swollen area on either breast. ? A fever or chills. ? Nausea or vomiting. ? Drainage other than breast milk from your nipples.  Your breasts do not become full before feedings by the fifth day after you give birth.  You feel sad and depressed.  Your baby is: ? Too sleepy to eat well. ? Having trouble sleeping. ? More than 81 week old and wetting fewer than 6 diapers in a 24-hour period. ? Not gaining weight by 66 days of age.  Your baby has fewer than 3 stools in a 24-hour period.  Your baby's skin or the white parts of his or her eyes become yellow. Get help right away if:  Your baby  is overly tired (lethargic) and does not want to wake up and feed.  Your baby develops an unexplained fever. Summary  Breastfeeding offers many health benefits for infant and mothers.  Try to breastfeed your infant when he or she shows early signs of hunger.  Gently tickle or stroke your  baby's lips with your finger or nipple to allow the baby to open his or her mouth. Bring the baby to your breast. Make sure that much of the areola is in your baby's mouth. Offer one side and burp the baby before you offer the other side.  Talk with your health care provider or lactation consultant if you have questions or you face problems as you breastfeed. This information is not intended to replace advice given to you by your health care provider. Make sure you discuss any questions you have with your health care provider. Document Released: 11/22/2005 Document Revised: 12/24/2016 Document Reviewed: 12/24/2016 Elsevier Interactive Patient Education  [employer].

## 2018-11-08 NOTE — MAU Note (Signed)
Pt reports generalized abdominal that started around 10pm after eating cheese dip. Pt states the pain is a constant achy pain that she rates 7/10. Has not tried anything for pain. Pt denies vomiting or diarrhea. Reports occasional constipation. LBM 11/06/2018. Pt denies vaginal bleeding or discharge.

## 2018-11-08 NOTE — Progress Notes (Signed)
    PRENATAL VISIT NOTE  Subjective:  Alexis Richardson is a 17 y.o. G1P0000 at 6823w3d being seen today for ongoing prenatal care.  She is currently monitored for the following issues for this high-risk pregnancy and has History of pulmonary embolus (PE); Keloid of skin; Supervision of high risk pregnancy, antepartum; and Rh negative state in antepartum period on their problem list.  Patient reports LLQ pain and constipation.  Contractions: Not present. Vag. Bleeding: None.  Movement: Absent. Denies leaking of fluid.   The following portions of the patient's history were reviewed and updated as appropriate: allergies, current medications, past family history, past medical history, past social history, past surgical history and problem list. Problem list updated.  Objective:   Vitals:   11/08/18 0821  BP: 130/79  Pulse: 94  Weight: 211 lb 6.4 oz (95.9 kg)    Fetal Status: Fetal Heart Rate (bpm): 158   Movement: Absent     General:  Alert, oriented and cooperative. Patient is in no acute distress.  Skin: Skin is warm and dry. No rash noted.   Cardiovascular: Normal heart rate noted  Respiratory: Normal respiratory effort, no problems with respiration noted  Abdomen: Soft, gravid, appropriate for gestational age.  Pain/Pressure: Present     Pelvic: Cervical exam deferred        Extremities: Normal range of motion.  Edema: None  Mental Status: Normal mood and affect. Normal behavior. Normal judgment and thought content.   Assessment and Plan:  Pregnancy: G1P0000 at 7423w3d  1. Supervision of high risk pregnancy, antepartum Does not want to know gender - US MFM OB DETAIL +14 WK; Future - Genetic Screening  2. History of pulmonary embolus (PE) Continue lovenox  3. Slow transit constipation Senna daily after Miralax today - sennosides-docusate sodium (SENOKOT-S) 8.6-50 MG tablet; Take 1 tablet by mouth daily.  Dispense: 90 tablet; Refill: 2 - polyethylene glycol (MIRALAX) packet; Take  17 g by mouth daily.  Dispense: 14 each; Refill: 0  General obstetric precautions including but not limited to vaginal bleeding, contractions, leaking of fluid and fetal movement were reviewed in detail with the patient. Please refer to After Visit Summary for other counseling recommendations.  Return in 4 weeks (on 12/06/2018).  Future Appointments  Date Time Provider Department Center  12/25/2018  2:00 PM [facility] US 3 [facility]    Reva Boresanya S Garnell Phenix, MD

## 2018-11-14 ENCOUNTER — Encounter: Payer: Self-pay | Admitting: *Deleted

## 2018-12-04 ENCOUNTER — Encounter: Payer: Self-pay | Admitting: *Deleted

## 2018-12-06 NOTE — L&D Delivery Note (Signed)
   Delivery Note At 12:34 PM a viable female "Alexis Richardson" was delivered via Vaginal, Spontaneous (Presentation: Right Occiput Anterior with restitution to ROT). Shoulders delivered easily and infant with good tone and spontaneous cry. Tactile stimulation given by provider and infant placed on mother's abdomen where nurse continued tactile stimulation.  Infant APGAR: 9, 9.  Cord clamped, cut, and blood collected. Placenta delivered spontaneously and noted to be intact with 3VC upon inspection.  Vaginal inspection revealed no lacerations.  Fundus firm, at the umbilicus, and bleeding small.  Mother hemodynamically stable and infant skin to skin prior to provider exit.  Mother desires condoms for birth control method and opts to breastfeed.  Infant weight at one hour of life: 7lbs 6oz, 20in.    Anesthesia:   Episiotomy: None Lacerations: None Suture Repair: None Est. Blood Loss (mL): 106  Mom to postpartum.  Baby to Couplet care / Skin to Skin.  Cherre Robins MSN, CNM 05/04/2019, 1:16 PM

## 2018-12-18 ENCOUNTER — Encounter (HOSPITAL_COMMUNITY): Payer: Self-pay

## 2018-12-18 ENCOUNTER — Telehealth: Payer: Self-pay | Admitting: General Practice

## 2018-12-18 NOTE — Telephone Encounter (Signed)
Patient called and left message on nurse voicemail line stating she needs a referral to [facility]. Called patient, no answer- left message stating we are trying to reach you to return your phone call. You may call back during business hours and the front office staff can assist you with that. You may call us back if you have other questions.

## 2018-12-25 ENCOUNTER — Ambulatory Visit (HOSPITAL_COMMUNITY)
Admission: RE | Admit: 2018-12-25 | Discharge: 2018-12-25 | Disposition: A | Discharge status: Home / Self Care | Payer: BLUE CROSS/BLUE SHIELD | Source: Ambulatory Visit | Attending: Family Medicine | Admitting: Family Medicine

## 2018-12-25 ENCOUNTER — Encounter: Payer: Self-pay | Admitting: Student

## 2018-12-25 ENCOUNTER — Encounter (HOSPITAL_COMMUNITY): Payer: Self-pay

## 2018-12-25 DIAGNOSIS — Z3A19 19 weeks gestation of pregnancy: Secondary | ICD-10-CM

## 2018-12-25 DIAGNOSIS — O2692 Pregnancy related conditions, unspecified, second trimester: Secondary | ICD-10-CM | POA: Diagnosis not present

## 2018-12-25 DIAGNOSIS — O0990 Supervision of high risk pregnancy, unspecified, unspecified trimester: Secondary | ICD-10-CM | POA: Insufficient documentation

## 2018-12-25 DIAGNOSIS — Z363 Encounter for antenatal screening for malformations: Secondary | ICD-10-CM

## 2018-12-25 DIAGNOSIS — O99212 Obesity complicating pregnancy, second trimester: Secondary | ICD-10-CM | POA: Diagnosis not present

## 2018-12-29 ENCOUNTER — Other Ambulatory Visit: Payer: Self-pay

## 2018-12-29 ENCOUNTER — Encounter (HOSPITAL_COMMUNITY): Payer: Self-pay

## 2018-12-29 ENCOUNTER — Inpatient Hospital Stay (HOSPITAL_COMMUNITY)
Admission: AD | Admit: 2018-12-29 | Discharge: 2018-12-29 | Disposition: A | Discharge status: Home / Self Care | Payer: BLUE CROSS/BLUE SHIELD | Attending: Obstetrics & Gynecology | Admitting: Obstetrics & Gynecology

## 2018-12-29 DIAGNOSIS — O360120 Maternal care for anti-D [Rh] antibodies, second trimester, not applicable or unspecified: Secondary | ICD-10-CM

## 2018-12-29 DIAGNOSIS — O26852 Spotting complicating pregnancy, second trimester: Secondary | ICD-10-CM | POA: Diagnosis not present

## 2018-12-29 DIAGNOSIS — Z86711 Personal history of pulmonary embolism: Secondary | ICD-10-CM | POA: Diagnosis not present

## 2018-12-29 DIAGNOSIS — Z6791 Unspecified blood type, Rh negative: Secondary | ICD-10-CM | POA: Diagnosis not present

## 2018-12-29 DIAGNOSIS — O4692 Antepartum hemorrhage, unspecified, second trimester: Secondary | ICD-10-CM

## 2018-12-29 DIAGNOSIS — Z3A19 19 weeks gestation of pregnancy: Secondary | ICD-10-CM

## 2018-12-29 DIAGNOSIS — O26899 Other specified pregnancy related conditions, unspecified trimester: Secondary | ICD-10-CM

## 2018-12-29 DIAGNOSIS — O209 Hemorrhage in early pregnancy, unspecified: Secondary | ICD-10-CM | POA: Diagnosis not present

## 2018-12-29 DIAGNOSIS — O26892 Other specified pregnancy related conditions, second trimester: Secondary | ICD-10-CM | POA: Diagnosis not present

## 2018-12-29 LAB — URINALYSIS, ROUTINE W REFLEX MICROSCOPIC
Bacteria, UA: NONE SEEN
Bilirubin Urine: NEGATIVE
GLUCOSE, UA: NEGATIVE mg/dL
HGB URINE DIPSTICK: NEGATIVE
Ketones, ur: NEGATIVE mg/dL
NITRITE: NEGATIVE
PH: 6 (ref 5.0–8.0)
Protein, ur: NEGATIVE mg/dL
Specific Gravity, Urine: 1.021 (ref 1.005–1.030)

## 2018-12-29 LAB — WET PREP, GENITAL
Clue Cells Wet Prep HPF POC: NONE SEEN
Sperm: NONE SEEN
Trich, Wet Prep: NONE SEEN
Yeast Wet Prep HPF POC: NONE SEEN

## 2018-12-29 MED ORDER — RHO D IMMUNE GLOBULIN 1500 UNIT/2ML IJ SOSY
300.0000 ug | PREFILLED_SYRINGE | Freq: Once | INTRAMUSCULAR | Status: AC
  Administered 2018-12-29: 300 ug via INTRAMUSCULAR
  Filled 2018-12-29: qty 2

## 2018-12-29 NOTE — MAU Note (Signed)
Pt reports spotting in her underwear around 2000. Pt reports the spotting lasted maybe 1 hour and then in stopped.

## 2018-12-29 NOTE — MAU Provider Note (Signed)
Chief Complaint:  Vaginal Bleeding   First Provider Initiated Contact with Patient 12/29/18 0030     HPI: Alexis Richardson is a 18 y.o. G1P0000 at 5219w5dwho presents to maternity admissions reporting spotting this evening  No further bleeding noted. She denies LOF, vaginal itching/burning, urinary symptoms, h/a, dizziness, n/v, diarrhea, constipation or fever/chills.    Vaginal Bleeding  The patient's primary symptoms include vaginal bleeding. The patient's pertinent negatives include no genital itching, genital lesions, genital odor or pelvic pain. This is a new problem. The current episode started today. The problem occurs rarely. The problem has been resolved. The patient is experiencing no pain. She is pregnant. Pertinent negatives include no abdominal pain, back pain, fever, headaches, nausea or vomiting. The vaginal discharge was bloody. The vaginal bleeding is spotting. She has not been passing clots. She has not been passing tissue. Nothing aggravates the symptoms. She has tried nothing for the symptoms.    RN Note: Pt reports spotting in her underwear around 2000. Pt reports the spotting lasted maybe 1 hour and then in stopped.  Past Medical History: Past Medical History:  Diagnosis Date  . Pulmonary embolism (HCC)    a. Dx 09/2017, had IUD removed in case this contributed.  . Right ventricular dilation    a. at time of PE 09/2017.    Past obstetric history: OB History  Gravida Para Term Preterm AB Living  1 0 0 0 0 0  SAB TAB Ectopic Multiple Live Births  0 0 0 0 0    # Outcome Date GA Lbr Len/2nd Weight Sex Delivery Anes PTL Lv  1 Current             Past Surgical History: Past Surgical History:  Procedure Laterality Date  . KELOID EXCISION      Family History: Family History  Problem Relation Age of Onset  . Hypertension Mother   . Hypertension Maternal Aunt   . Cancer Maternal Grandfather   . Pulmonary embolism Neg Hx   . Heart disease Neg Hx     Social  History: Social History   Tobacco Use  . Smoking status: Never Smoker  . Smokeless tobacco: Never Used  Substance Use Topics  . Alcohol use: No  . Drug use: Not Currently    Types: Marijuana    Comment: last mid-Oct    Allergies: No Known Allergies  Meds:  Medications Prior to Admission  Medication Sig Dispense Refill Last Dose  . enoxaparin (LOVENOX) 40 MG/0.4ML injection Inject 0.4 mLs (40 mg total) into the skin daily. 90 Syringe 4 12/29/2018 at Unknown time  . Prenatal Multivit-Min-Fe-FA (PRENATAL VITAMINS) 0.8 MG tablet Take 1 tablet by mouth daily. 30 tablet 12 12/29/2018 at Unknown time  . polyethylene glycol (MIRALAX) packet Take 17 g by mouth daily. 14 each 0 More than a month at Unknown time  . sennosides-docusate sodium (SENOKOT-S) 8.6-50 MG tablet Take 1 tablet by mouth daily. (Patient not taking: Reported on 12/25/2018) 90 tablet 2 Not Taking    I have reviewed patient's Past Medical Hx, Surgical Hx, Family Hx, Social Hx, medications and allergies.   ROS:  Review of Systems  Constitutional: Negative for fever.  Gastrointestinal: Negative for abdominal pain, nausea and vomiting.  Genitourinary: Positive for vaginal bleeding. Negative for pelvic pain.  Musculoskeletal: Negative for back pain.  Neurological: Negative for headaches.   Other systems negative  Physical Exam   Patient Vitals for the past 24 hrs:  BP Temp Pulse Resp SpO2 Weight  12/29/18 0021 139/74 98.1 F (36.7 C) (!) 104 20 100 % -  12/29/18 0017 - - - - - 98.2 kg   Constitutional: Well-developed, well-nourished female in no acute distress.  Cardiovascular: normal rate and rhythm Respiratory: normal effort, clear to auscultation bilaterally GI: Abd soft, non-tender, gravid appropriate for gestational age.   No rebound or guarding. MS: Extremities nontender, no edema, normal ROM Neurologic: Alert and oriented x 4.  GU: Neg CVAT.  PELVIC EXAM: Cervix pink, visually closed, without lesion, scant  white creamy discharge, vaginal walls and external genitalia normal   No blood seen  Cervix closed and long  FHT:   159  Labs: Results for orders placed or performed during the hospital encounter of 12/29/18 (from the past 24 hour(s))  Urinalysis, Routine w reflex microscopic     Status: Abnormal   Collection Time: 12/29/18 12:20 AM  Result Value Ref Range   Color, Urine YELLOW YELLOW   APPearance CLEAR CLEAR   Specific Gravity, Urine 1.021 1.005 - 1.030   pH 6.0 5.0 - 8.0   Glucose, UA NEGATIVE NEGATIVE mg/dL   Hgb urine dipstick NEGATIVE NEGATIVE   Bilirubin Urine NEGATIVE NEGATIVE   Ketones, ur NEGATIVE NEGATIVE mg/dL   Protein, ur NEGATIVE NEGATIVE mg/dL   Nitrite NEGATIVE NEGATIVE   Leukocytes, UA TRACE (A) NEGATIVE   RBC / HPF 0-5 0 - 5 RBC/hpf   WBC, UA 0-5 0 - 5 WBC/hpf   Bacteria, UA NONE SEEN NONE SEEN   Squamous Epithelial / LPF 0-5 0 - 5   Mucus PRESENT   Rh IG workup (includes ABO/Rh)     Status: None (Preliminary result)   Collection Time: 12/29/18 12:44 AM  Result Value Ref Range   Gestational Age(Wks) 19    ABO/RH(D) A NEG    Antibody Screen NEG    Unit Number [id number]    Blood Component Type RHIG    Unit division 00    Status of Unit ISSUED    Transfusion Status      OK TO TRANSFUSE Performed at [facility], [address] [place], [place] [address]   Wet prep, genital     Status: Abnormal   Collection Time: 12/29/18 12:55 AM  Result Value Ref Range   Yeast Wet Prep HPF POC NONE SEEN NONE SEEN   Trich, Wet Prep NONE SEEN NONE SEEN   Clue Cells Wet Prep HPF POC NONE SEEN NONE SEEN   WBC, Wet Prep HPF POC FEW (A) NONE SEEN   Sperm NONE SEEN     A/Negative/-- (11/04 1111)  Imaging:    MAU Course/MDM: I have ordered labs and reviewed results. Urine is clear and wet prep is clear.  Discussed even though exam is negative for blood, since she saw blood at home, we should do Rhophylac.   Treatments in MAU included Rhophylac.     Assessment: Single intrauterine pregnancy at [redacted]w[redacted]d  Bleeding in the second trimester Rh Negative  Plan: Discharge home Bleeding precautions Follow up in Office for prenatal visits and recheck of status  Encouraged to return here or to other Urgent Care/ED if she develops worsening of symptoms, increase in pain, fever, or other concerning symptoms.   Pt stable at time of discharge.  Wynelle Bourgeois CNM, MSN Certified Nurse-Midwife 12/29/2018 12:30 AM

## 2018-12-29 NOTE — Discharge Instructions (Signed)
Vaginal Bleeding During Pregnancy, Second Trimester  A small amount of bleeding (spotting) from the vagina is common during pregnancy. Sometimes the bleeding is normal and is not a sign of problems, and sometimes it is a sign of something serious. Tell your doctor about any bleeding from your vagina right away. Follow these instructions at home: Activity  Follow your doctor's instructions about how active you can be.  If needed, make plans for someone to help with your normal activities.  Do not exercise or do activities that take a lot of effort until your doctor says that this is safe.  Do not lift anything that is heavier than 10 lb (4.5 kg) until your doctor says that this is safe.  Do not have sex or orgasms until your doctor says that this is safe. Medicines  Take over-the-counter and prescription medicines only as told by your doctor.  Do not take aspirin. It can cause bleeding. General instructions  Watch your condition for any changes.  Write down: ? The number of pads you use each day. ? How often you change pads. ? How soaked (saturated) your pads are.  Do not use tampons.  Do not douche.  If you pass any tissue from your vagina, save it to show to your doctor.  Keep all follow-up visits as told by your doctor. This is important. Contact a doctor if:  You have vaginal bleeding at any time during pregnancy.  You have cramps.  You have a fever that does not get better with medicine. Get help right away if:  You have very bad cramps in your back or belly (abdomen).  You have contractions.  You have chills.  You pass large clots or a lot of tissue from your vagina.  Your bleeding gets worse.  You feel light-headed.  You feel weak.  You pass out (faint).  You are leaking fluid from your vagina.  You have a gush of fluid from your vagina. Summary  Sometimes vaginal bleeding during pregnancy is normal and is not a problem. Sometimes it may be a  sign of something serious.  Tell your doctor about any bleeding from your vagina right away.  Follow your doctor's instructions about how active you can be. You may need someone to help you with your normal activities. This information is not intended to replace advice given to you by your health care provider. Make sure you discuss any questions you have with your health care provider. Document Released: 04/08/2014 Document Revised: 02/23/2017 Document Reviewed: 02/23/2017 Elsevier Interactive Patient Education  2019 [employer] Rh0 [D] Immune Globulin injection What is this medicine? RhO [D] IMMUNE GLOBULIN (i MYOON GLOB yoo lin) is used to treat idiopathic thrombocytopenic purpura (ITP). This medicine is used in RhO negative mothers who are pregnant with a RhO positive child. It is also used after a transfusion of RhO positive blood into a RhO negative person. This medicine may be used for other purposes; ask your health care provider or pharmacist if you have questions. COMMON BRAND NAME(S): BayRho-D, HyperRHO S/D, MICRhoGAM, RhoGAM, Rhophylac, WinRho SDF What should I tell my health care provider before I take this medicine? They need to know if you have any of these conditions: -bleeding disorders -low levels of immunoglobulin A in the body -no spleen -an unusual or allergic reaction to human immune globulin, other medicines, foods, dyes, or preservatives -pregnant or trying to get pregnant -breast-feeding How should I use this medicine? This medicine is for injection into a  muscle or into a vein. It is given by a health care professional in a hospital or clinic setting. Talk to your pediatrician regarding the use of this medicine in children. This medicine is not approved for use in children. Overdosage: If you think you have taken too much of this medicine contact a poison control center or emergency room at once. NOTE: This medicine is only for you. Do not share this medicine  with others. What if I miss a dose? It is important not to miss your dose. Call your doctor or health care professional if you are unable to keep an appointment. What may interact with this medicine? -live virus vaccines, like measles, mumps, or rubella This list may not describe all possible interactions. Give your health care provider a list of all the medicines, herbs, non-prescription drugs, or dietary supplements you use. Also tell them if you smoke, drink alcohol, or use illegal drugs. Some items may interact with your medicine. What should I watch for while using this medicine? This medicine is made from human blood. It may be possible to pass an infection in this medicine. Talk to your doctor about the risks and benefits of this medicine. This medicine may interfere with live virus vaccines. Before you get live virus vaccines tell your health care professional if you have received this medicine within the past 3 months. What side effects may I notice from receiving this medicine? Side effects that you should report to your doctor or health care professional as soon as possible: -allergic reactions like skin rash, itching or hives, swelling of the face, lips, or tongue -breathing problems -chest pain or tightness -yellowing of the eyes or skin Side effects that usually do not require medical attention (report to your doctor or health care professional if they continue or are bothersome): -fever -pain and tenderness at site where injected This list may not describe all possible side effects. Call your doctor for medical advice about side effects. You may report side effects to FDA at 1-800-FDA-1088. Where should I keep my medicine? This drug is given in a hospital or clinic and will not be stored at home. NOTE: This sheet is a summary. It may not cover all possible information. If you have questions about this medicine, talk to your doctor, pharmacist, or health care provider.  2019  Elsevier/Gold Standard (2008-07-22 14:06:10)

## 2018-12-30 LAB — RH IG WORKUP (INCLUDES ABO/RH)
ABO/RH(D): A NEG
Antibody Screen: NEGATIVE
Gestational Age(Wks): 19
UNIT DIVISION: 0

## 2019-01-01 LAB — GC/CHLAMYDIA PROBE AMP (~~LOC~~) NOT AT ARMC
Chlamydia: NEGATIVE
Neisseria Gonorrhea: NEGATIVE

## 2019-01-02 ENCOUNTER — Emergency Department (HOSPITAL_COMMUNITY)
Admission: EM | Admit: 2019-01-02 | Discharge: 2019-01-02 | Disposition: A | Discharge status: Home / Self Care | Payer: BLUE CROSS/BLUE SHIELD | Attending: Emergency Medicine | Admitting: Emergency Medicine

## 2019-01-02 ENCOUNTER — Encounter: Payer: Self-pay | Admitting: Family Medicine

## 2019-01-02 DIAGNOSIS — R109 Unspecified abdominal pain: Secondary | ICD-10-CM | POA: Insufficient documentation

## 2019-01-02 DIAGNOSIS — O9989 Other specified diseases and conditions complicating pregnancy, childbirth and the puerperium: Secondary | ICD-10-CM | POA: Diagnosis not present

## 2019-01-02 DIAGNOSIS — Z3A19 19 weeks gestation of pregnancy: Secondary | ICD-10-CM | POA: Diagnosis not present

## 2019-01-02 DIAGNOSIS — Z5321 Procedure and treatment not carried out due to patient leaving prior to being seen by health care provider: Secondary | ICD-10-CM | POA: Diagnosis not present

## 2019-01-02 LAB — CBC
HCT: 34.8 % — ABNORMAL LOW (ref 36.0–46.0)
Hemoglobin: 11.3 g/dL — ABNORMAL LOW (ref 12.0–15.0)
MCH: 28.3 pg (ref 26.0–34.0)
MCHC: 32.5 g/dL (ref 30.0–36.0)
MCV: 87 fL (ref 80.0–100.0)
Platelets: 194 10*3/uL (ref 150–400)
RBC: 4 MIL/uL (ref 3.87–5.11)
RDW: 13.3 % (ref 11.5–15.5)
WBC: 9.6 10*3/uL (ref 4.0–10.5)
nRBC: 0 % (ref 0.0–0.2)

## 2019-01-02 LAB — I-STAT BETA HCG BLOOD, ED (MC, WL, AP ONLY): I-stat hCG, quantitative: >2000 m[IU]/mL — ABNORMAL HIGH (ref <5)

## 2019-01-02 LAB — COMPREHENSIVE METABOLIC PANEL
ALT: 9 U/L (ref 0–44)
AST: 14 U/L — ABNORMAL LOW (ref 15–41)
Albumin: 2.9 g/dL — ABNORMAL LOW (ref 3.5–5.0)
Alkaline Phosphatase: 38 U/L (ref 38–126)
Anion gap: 9 (ref 5–15)
BUN: 7 mg/dL (ref 6–20)
CHLORIDE: 104 mmol/L (ref 98–111)
CO2: 23 mmol/L (ref 22–32)
Calcium: 9.4 mg/dL (ref 8.9–10.3)
Creatinine, Ser: 0.66 mg/dL (ref 0.44–1.00)
GFR calc Af Amer: >60 mL/min (ref >60)
Glucose, Bld: 78 mg/dL (ref 70–99)
Potassium: 3.7 mmol/L (ref 3.5–5.1)
Sodium: 136 mmol/L (ref 135–145)
Total Bilirubin: 0.3 mg/dL (ref 0.3–1.2)
Total Protein: 6.4 g/dL — ABNORMAL LOW (ref 6.5–8.1)

## 2019-01-02 LAB — LIPASE, BLOOD: Lipase: 25 U/L (ref 11–51)

## 2019-01-02 NOTE — ED Triage Notes (Addendum)
Pt endorses abd cramping that began last night with n/v, pt is [redacted]wks pregnant. Went to [facility] Friday for bleeding, she states that everything was okay with baby. VSS. Fetal heart tones in the 150s

## 2019-01-02 NOTE — ED Notes (Signed)
No answer for treatment room. 

## 2019-01-09 ENCOUNTER — Encounter: Payer: Self-pay | Admitting: Family Medicine

## 2019-01-09 ENCOUNTER — Other Ambulatory Visit: Payer: Self-pay

## 2019-01-09 ENCOUNTER — Ambulatory Visit (INDEPENDENT_AMBULATORY_CARE_PROVIDER_SITE_OTHER): Payer: BLUE CROSS/BLUE SHIELD | Admitting: Family Medicine

## 2019-01-09 VITALS — BP 120/78 | HR 96 | Wt 218.2 lb

## 2019-01-09 DIAGNOSIS — O0992 Supervision of high risk pregnancy, unspecified, second trimester: Secondary | ICD-10-CM

## 2019-01-09 DIAGNOSIS — O0990 Supervision of high risk pregnancy, unspecified, unspecified trimester: Secondary | ICD-10-CM

## 2019-01-09 DIAGNOSIS — Z86711 Personal history of pulmonary embolism: Secondary | ICD-10-CM

## 2019-01-09 NOTE — Progress Notes (Addendum)
   PRENATAL VISIT NOTE  Subjective:  Alexis Richardson is a 18 y.o. G1P0000 at 5724w2d being seen today for ongoing prenatal care.  She is currently monitored for the following issues for this high-risk pregnancy and has History of pulmonary embolus (PE); Keloid of skin; Supervision of high risk pregnancy, antepartum; and Rh negative state in antepartum period on their problem list.  - went to ED for vomiting, left without being seen - went to MAU and got Rhogam for spotting   Patient reports no complaints.  Contractions: Not present. Vag. Bleeding: None.  Movement: Present. Denies leaking of fluid.   The following portions of the patient's history were reviewed and updated as appropriate: allergies, current medications, past family history, past medical history, past social history, past surgical history and problem list. Problem list updated.  Objective:   Vitals:   01/09/19 1137  BP: 120/78  Pulse: 96  Weight: 218 lb 3.2 oz (99 kg)    Fetal Status: Fetal Heart Rate (bpm): 155   Movement: Present     General:  Alert, oriented and cooperative. Patient is in no acute distress.  Skin: Skin is warm and dry. No rash noted.   Cardiovascular: Normal heart rate noted  Respiratory: Normal respiratory effort, no problems with respiration noted  Abdomen: Soft, gravid, appropriate for gestational age.  Pain/Pressure: Absent     Pelvic: Cervical exam deferred        Extremities: Normal range of motion.  Edema: None  Mental Status: Normal mood and affect. Normal behavior. Normal judgment and thought content.   Assessment and Plan:  Pregnancy: G1P0000 at 1224w2d  1. History of pulmonary embolus (PE) -- continue Lovenox ppx  2. Supervision of high risk pregnancy, antepartum -- prenatal record reviewed and UTD -- counseling on contraceptive choice, considering Paragard  -- Rx for breast pump printed and MyChart message sent with instructions to pick up if needed  Preterm labor symptoms and  general obstetric precautions including but not limited to vaginal bleeding, contractions, leaking of fluid and fetal movement were reviewed in detail with the patient. Please refer to After Visit Summary for other counseling recommendations.   Return in about 4 weeks (around 02/06/2019) for HROB.  Future Appointments  Date Time Provider Department Center  02/07/2019  8:15 AM Adam PhenixArnold, [facility] G[facility]  02/28/2019 10:55 AM Reva BoresPratt, Tanya S, [facility]   Tamera StandsLaurel S Shantia Sanford, DO

## 2019-01-09 NOTE — Addendum Note (Signed)
Addended by: Tamera Stands on: 01/09/2019 04:56 PM   Modules accepted: Orders

## 2019-01-09 NOTE — Progress Notes (Signed)
Medicaid Home Form Completed on 01/09/19

## 2019-02-07 ENCOUNTER — Other Ambulatory Visit: Payer: Self-pay

## 2019-02-07 ENCOUNTER — Ambulatory Visit (INDEPENDENT_AMBULATORY_CARE_PROVIDER_SITE_OTHER): Payer: BLUE CROSS/BLUE SHIELD | Admitting: Obstetrics & Gynecology

## 2019-02-07 ENCOUNTER — Encounter: Payer: Self-pay | Admitting: Obstetrics & Gynecology

## 2019-02-07 VITALS — BP 127/81 | HR 100 | Wt 221.3 lb

## 2019-02-07 DIAGNOSIS — Z3A30 30 weeks gestation of pregnancy: Secondary | ICD-10-CM

## 2019-02-07 DIAGNOSIS — O0990 Supervision of high risk pregnancy, unspecified, unspecified trimester: Secondary | ICD-10-CM

## 2019-02-07 DIAGNOSIS — Z86711 Personal history of pulmonary embolism: Secondary | ICD-10-CM

## 2019-02-07 DIAGNOSIS — O0993 Supervision of high risk pregnancy, unspecified, third trimester: Secondary | ICD-10-CM

## 2019-02-07 NOTE — Progress Notes (Signed)
   PRENATAL VISIT NOTE  Subjective:  Alexis Richardson is a 18 y.o. G1P0000 at [redacted]w[redacted]d being seen today for ongoing prenatal care.  She is currently monitored for the following issues for this high-risk pregnancy and has History of pulmonary embolus (PE); Keloid of skin; Supervision of high risk pregnancy, antepartum; and Rh negative state in antepartum period on their problem list.  Patient reports no complaints.  Contractions: Irritability. Vag. Bleeding: None.   . Denies leaking of fluid.   The following portions of the patient's history were reviewed and updated as appropriate: allergies, current medications, past family history, past medical history, past social history, past surgical history and problem list. Problem list updated.  Objective:   Vitals:   02/07/19 0833  BP: 127/81  Pulse: 100  Weight: 221 lb 4.8 oz (100.4 kg)    Fetal Status: Fetal Heart Rate (bpm): 159         General:  Alert, oriented and cooperative. Patient is in no acute distress.  Skin: Skin is warm and dry. No rash noted.   Cardiovascular: Normal heart rate noted  Respiratory: Normal respiratory effort, no problems with respiration noted  Abdomen: Soft, gravid, appropriate for gestational age.  Pain/Pressure: Absent     Pelvic: Cervical exam deferred        Extremities: Normal range of motion.  Edema: None  Mental Status: Normal mood and affect. Normal behavior. Normal judgment and thought content.   Assessment and Plan:  Pregnancy: G1P0000 at [redacted]w[redacted]d  1. Supervision of high risk pregnancy, antepartum Nl progress  2. History of pulmonary embolus (PE) Continue Lovenox  Preterm labor symptoms and general obstetric precautions including but not limited to vaginal bleeding, contractions, leaking of fluid and fetal movement were reviewed in detail with the patient. Please refer to After Visit Summary for other counseling recommendations.  Return in 3 weeks (on 02/28/2019).  Future Appointments  Date Time  Provider Department Center  02/28/2019 10:55 AM Reva Bores, MD [facility]    Scheryl Darter, MD

## 2019-02-07 NOTE — Patient Instructions (Signed)

## 2019-02-26 ENCOUNTER — Other Ambulatory Visit: Payer: Self-pay | Admitting: *Deleted

## 2019-02-26 DIAGNOSIS — O0990 Supervision of high risk pregnancy, unspecified, unspecified trimester: Secondary | ICD-10-CM

## 2019-02-27 ENCOUNTER — Encounter: Payer: Self-pay | Admitting: *Deleted

## 2019-02-28 ENCOUNTER — Other Ambulatory Visit: Payer: Self-pay

## 2019-02-28 ENCOUNTER — Telehealth: Payer: Self-pay | Admitting: Licensed Clinical Social Worker

## 2019-02-28 ENCOUNTER — Encounter: Payer: Self-pay | Admitting: Family Medicine

## 2019-02-28 ENCOUNTER — Other Ambulatory Visit: Payer: BLUE CROSS/BLUE SHIELD

## 2019-02-28 ENCOUNTER — Ambulatory Visit (INDEPENDENT_AMBULATORY_CARE_PROVIDER_SITE_OTHER): Payer: BLUE CROSS/BLUE SHIELD | Admitting: Family Medicine

## 2019-02-28 VITALS — BP 129/77 | HR 98 | Wt 228.7 lb

## 2019-02-28 DIAGNOSIS — O0990 Supervision of high risk pregnancy, unspecified, unspecified trimester: Secondary | ICD-10-CM

## 2019-02-28 DIAGNOSIS — O365930 Maternal care for other known or suspected poor fetal growth, third trimester, not applicable or unspecified: Secondary | ICD-10-CM

## 2019-02-28 DIAGNOSIS — Z23 Encounter for immunization: Secondary | ICD-10-CM | POA: Diagnosis not present

## 2019-02-28 DIAGNOSIS — O0993 Supervision of high risk pregnancy, unspecified, third trimester: Secondary | ICD-10-CM

## 2019-02-28 DIAGNOSIS — O360130 Maternal care for anti-D [Rh] antibodies, third trimester, not applicable or unspecified: Secondary | ICD-10-CM

## 2019-02-28 DIAGNOSIS — O26899 Other specified pregnancy related conditions, unspecified trimester: Secondary | ICD-10-CM

## 2019-02-28 DIAGNOSIS — Z6791 Unspecified blood type, Rh negative: Secondary | ICD-10-CM

## 2019-02-28 DIAGNOSIS — Z3A28 28 weeks gestation of pregnancy: Secondary | ICD-10-CM

## 2019-02-28 MED ORDER — RHO D IMMUNE GLOBULIN 1500 UNIT/2ML IJ SOSY
300.0000 ug | PREFILLED_SYRINGE | Freq: Once | INTRAMUSCULAR | Status: AC
  Administered 2019-02-28: 300 ug via INTRAMUSCULAR

## 2019-02-28 NOTE — Progress Notes (Signed)
Called patient at home and walked her through Babyscripts optimization process. Patient is registered and has no questions.

## 2019-02-28 NOTE — Patient Instructions (Signed)

## 2019-02-28 NOTE — Progress Notes (Signed)
Pt has easy access to a BP cuff at her mother's house. Pt given Rhogam injection and Tdap injection.  Pt tolerated both well.

## 2019-02-28 NOTE — Progress Notes (Unsigned)
Subjective: Alexis Richardson is a G1P0000 at [redacted]w[redacted]d who presents to the [facility] today for ob visit; patient was contacted via telehealth.  She does not have a history of any mental health concerns. She is currently sexually active. Patient report previous history of IUD for birth control. Patient states family as her support system.   LMP 08/13/2018 (Exact Date)   Birth Control History:  IUD  MDM Patient counseled on all options for birth control today including LARC. Patient desires no method initiated for birth control. Patient reports blood clots with previous use of birth control and reports utilization of condoms for std prevention and birth control.  Assessment:  18 y.o. female declines larcs for birth control  Plan: No further plan   Gwyndolyn Saxon, [place] 02/28/2019 1:37 PM

## 2019-02-28 NOTE — Telephone Encounter (Signed)
Left message for callback regarding contraception counseling

## 2019-03-01 ENCOUNTER — Telehealth: Payer: Self-pay | Admitting: *Deleted

## 2019-03-01 DIAGNOSIS — O24410 Gestational diabetes mellitus in pregnancy, diet controlled: Secondary | ICD-10-CM

## 2019-03-01 LAB — RPR: RPR Ser Ql: NONREACTIVE

## 2019-03-01 LAB — HIV ANTIBODY (ROUTINE TESTING W REFLEX): HIV Screen 4th Generation wRfx: NONREACTIVE

## 2019-03-01 LAB — CBC
Hematocrit: 31.9 % — ABNORMAL LOW (ref 34.0–46.6)
Hemoglobin: 10.7 g/dL — ABNORMAL LOW (ref 11.1–15.9)
MCH: 28.9 pg (ref 26.6–33.0)
MCHC: 33.5 g/dL (ref 31.5–35.7)
MCV: 86 fL (ref 79–97)
Platelets: 191 10*3/uL (ref 150–450)
RBC: 3.7 x10E6/uL — ABNORMAL LOW (ref 3.77–5.28)
RDW: 13.1 % (ref 11.7–15.4)
WBC: 11 10*3/uL — ABNORMAL HIGH (ref 3.4–10.8)

## 2019-03-01 LAB — GLUCOSE TOLERANCE, 2 HOURS W/ 1HR
Glucose, 1 hour: 159 mg/dL (ref 65–179)
Glucose, 2 hour: 184 mg/dL — ABNORMAL HIGH (ref 65–152)
Glucose, Fasting: 95 mg/dL — ABNORMAL HIGH (ref 65–91)

## 2019-03-01 LAB — ANTIBODY SCREEN: Antibody Screen: NEGATIVE

## 2019-03-01 MED ORDER — ACCU-CHEK FASTCLIX LANCETS MISC: 1.0000 | Freq: Four times a day (QID) | 12 refills | Status: DC

## 2019-03-01 MED ORDER — GLUCOSE BLOOD VI STRP: ORAL_STRIP | 12 refills | Status: DC

## 2019-03-01 MED ORDER — ACCU-CHEK GUIDE ME W/DEVICE KIT: 1.0000 | PACK | Freq: Once | 0 refills | Status: AC

## 2019-03-01 NOTE — Progress Notes (Signed)
   PRENATAL VISIT NOTE  Subjective:  Alexis Richardson is a 18 y.o. G1P0000 at [redacted]w[redacted]d being seen today for ongoing prenatal care.  She is currently monitored for the following issues for this high-risk pregnancy and has History of pulmonary embolus (PE); Keloid of skin; Supervision of high risk pregnancy, antepartum; and Rh negative state in antepartum period on their problem list.  Patient reports no complaints.  Contractions: Not present. Vag. Bleeding: None.  Movement: Present. Denies leaking of fluid.   The following portions of the patient's history were reviewed and updated as appropriate: allergies, current medications, past family history, past medical history, past social history, past surgical history and problem list.   Objective:   Vitals:   02/28/19 1112  BP: 129/77  Pulse: 98  Weight: 228 lb 11.2 oz (103.7 kg)    Fetal Status: Fetal Heart Rate (bpm): 155 Fundal Height: 25 cm Movement: Present     General:  Alert, oriented and cooperative. Patient is in no acute distress.  Skin: Skin is warm and dry. No rash noted.   Cardiovascular: Normal heart rate noted  Respiratory: Normal respiratory effort, no problems with respiration noted  Abdomen: Soft, gravid, appropriate for gestational age.  Pain/Pressure: Present     Pelvic: Cervical exam deferred        Extremities: Normal range of motion.  Edema: None  Mental Status: Normal mood and affect. Normal behavior. Normal judgment and thought content.   Assessment and Plan:  Pregnancy: G1P0000 at [redacted]w[redacted]d 1. Supervision of high risk pregnancy, antepartum Continue routine prenatal care. 28 wk labs today To take BP at home with BYOD BabyRx - Tdap vaccine greater than or equal to 7yo IM - [facility] AMB BABYSCRIPTS SCHEDULE OPTIMIZATION  2. Rh negative state in antepartum period - rho (d) immune globulin (RHIG/RHOPHYLAC) injection 300 mcg  3. Small for gestational age (SGA) Has f/u with MFM scheduled - [place] MFM OB FOLLOW UP; Future   Preterm labor symptoms and general obstetric precautions including but not limited to vaginal bleeding, contractions, leaking of fluid and fetal movement were reviewed in detail with the patient. Please refer to After Visit Summary for other counseling recommendations.   Return in about 8 weeks (around 04/25/2019) for 4 wk virtual visit, in person for 36 wks.  Future Appointments  Date Time Provider Department Center  03/12/2019  9:30 AM [facility] [place] 1 [facility]    Reva Bores, MD

## 2019-03-01 NOTE — Telephone Encounter (Signed)
-----   Message from Reva Bores, MD sent at 03/01/2019 11:52 AM EDT ----- Has GDM, please virtually arrange with Bev, or she can see in person--her preference. Also needs BabyRx for DM added. Add virtual visit in 2 wks, to review blood sugars.

## 2019-03-01 NOTE — Telephone Encounter (Signed)
I called Alexis Richardson and informed her that she has GDM and we reviewed her results. I answered her questions and informed her she will get a call or Mychart message about her appointments for Diabetes Education and then 2 weeks after that televisit to review blood glucoses. She voices understanding. Also notified our Babyscripts super user to have her added to Diabetes on Babyscripts.

## 2019-03-06 ENCOUNTER — Other Ambulatory Visit: Payer: Self-pay

## 2019-03-12 ENCOUNTER — Other Ambulatory Visit: Payer: Self-pay

## 2019-03-12 ENCOUNTER — Ambulatory Visit (HOSPITAL_COMMUNITY): Payer: BLUE CROSS/BLUE SHIELD | Admitting: *Deleted

## 2019-03-12 ENCOUNTER — Ambulatory Visit (INDEPENDENT_AMBULATORY_CARE_PROVIDER_SITE_OTHER): Payer: BLUE CROSS/BLUE SHIELD | Admitting: Obstetrics & Gynecology

## 2019-03-12 ENCOUNTER — Encounter (HOSPITAL_COMMUNITY): Payer: Self-pay

## 2019-03-12 ENCOUNTER — Ambulatory Visit (HOSPITAL_COMMUNITY)
Admission: RE | Admit: 2019-03-12 | Discharge: 2019-03-12 | Disposition: A | Discharge status: Home / Self Care | Payer: BLUE CROSS/BLUE SHIELD | Source: Ambulatory Visit | Attending: Obstetrics and Gynecology | Admitting: Obstetrics and Gynecology

## 2019-03-12 VITALS — BP 128/67 | HR 100 | Temp 98.4°F

## 2019-03-12 DIAGNOSIS — Z3A30 30 weeks gestation of pregnancy: Secondary | ICD-10-CM

## 2019-03-12 DIAGNOSIS — O99213 Obesity complicating pregnancy, third trimester: Secondary | ICD-10-CM | POA: Diagnosis not present

## 2019-03-12 DIAGNOSIS — O360930 Maternal care for other rhesus isoimmunization, third trimester, not applicable or unspecified: Secondary | ICD-10-CM

## 2019-03-12 DIAGNOSIS — Z86711 Personal history of pulmonary embolism: Secondary | ICD-10-CM

## 2019-03-12 DIAGNOSIS — O24410 Gestational diabetes mellitus in pregnancy, diet controlled: Secondary | ICD-10-CM

## 2019-03-12 DIAGNOSIS — O26843 Uterine size-date discrepancy, third trimester: Secondary | ICD-10-CM

## 2019-03-12 DIAGNOSIS — Z362 Encounter for other antenatal screening follow-up: Secondary | ICD-10-CM | POA: Diagnosis not present

## 2019-03-12 DIAGNOSIS — Z6791 Unspecified blood type, Rh negative: Secondary | ICD-10-CM

## 2019-03-12 DIAGNOSIS — Z3A31 31 weeks gestation of pregnancy: Secondary | ICD-10-CM

## 2019-03-12 DIAGNOSIS — O2693 Pregnancy related conditions, unspecified, third trimester: Secondary | ICD-10-CM

## 2019-03-12 DIAGNOSIS — O0990 Supervision of high risk pregnancy, unspecified, unspecified trimester: Secondary | ICD-10-CM | POA: Insufficient documentation

## 2019-03-12 DIAGNOSIS — O26899 Other specified pregnancy related conditions, unspecified trimester: Secondary | ICD-10-CM

## 2019-03-12 DIAGNOSIS — O99210 Obesity complicating pregnancy, unspecified trimester: Secondary | ICD-10-CM

## 2019-03-12 DIAGNOSIS — O26893 Other specified pregnancy related conditions, third trimester: Secondary | ICD-10-CM | POA: Diagnosis not present

## 2019-03-12 NOTE — Progress Notes (Signed)
   TELEHEALTH VIRTUAL OBSTETRICS VISIT ENCOUNTER NOTE  I connected with Alexis Richardson on 03/12/19 at  2:55 PM EDT by telephone at home and verified that I am speaking with the correct person using two identifiers.   I discussed the limitations, risks, security and privacy concerns of performing an evaluation and management service by telephone and the availability of in person appointments. I also discussed with the patient that there may be a patient responsible charge related to this service. The patient expressed understanding and agreed to proceed.  Subjective:  Alexis Richardson is a 18 y.o. G1P0000 (daughter Alexis Richardson) at [redacted]w[redacted]d being followed for ongoing prenatal care.  She is currently monitored for the following issues for this high-risk pregnancy and has History of pulmonary embolus (PE); Keloid of skin; Supervision of high risk pregnancy, antepartum; Rh negative state in antepartum period; and Obesity in pregnancy on their problem list.  Patient reports no complaints. Reports fetal movement. Denies any contractions, bleeding or leaking of fluid.   The following portions of the patient's history were reviewed and updated as appropriate: allergies, current medications, past family history, past medical history, past social history, past surgical history and problem list.   Objective:   General:  Alert, oriented and cooperative.   Mental Status: Normal mood and affect perceived. Normal judgment and thought content.  Rest of physical exam deferred due to type of encounter  Assessment and Plan:  Pregnancy: G1P0000 at [redacted]w[redacted]d 1. Supervision of high risk pregnancy, antepartum - she had a MFM u/s today (results not available yet)  2. History of pulmonary embolus (PE) - on Lovenox  3. Rh negative state in antepartum period - had rhophylac  4. Diet controlled gestational diabetes mellitus (GDM), antepartum - She did not keep her appt with Bev - She has not gotten her meter yet - Her mom  checked her sugar after she ate and it was 147 on 1 occasion  5. Obesity in pregnancy   Preterm labor symptoms and general obstetric precautions including but not limited to vaginal bleeding, contractions, leaking of fluid and fetal movement were reviewed in detail with the patient.  I discussed the assessment and treatment plan with the patient. The patient was provided an opportunity to ask questions and all were answered. The patient agreed with the plan and demonstrated an understanding of the instructions. The patient was advised to call back or seek an in-person office evaluation/go to MAU at [facility] for any urgent or concerning symptoms. Please refer to After Visit Summary for other counseling recommendations.   I provided minutes of non-face-to-face time during this encounter.  No follow-ups on file.  No future appointments.  Alexis Bossier, MD Center for [employer], [facility]

## 2019-03-14 ENCOUNTER — Encounter: Payer: Self-pay | Admitting: Obstetrics & Gynecology

## 2019-03-20 ENCOUNTER — Encounter: Payer: Self-pay | Admitting: General Practice

## 2019-03-22 ENCOUNTER — Other Ambulatory Visit: Payer: Self-pay

## 2019-03-22 ENCOUNTER — Other Ambulatory Visit: Payer: Self-pay | Admitting: *Deleted

## 2019-03-22 ENCOUNTER — Ambulatory Visit: Payer: BLUE CROSS/BLUE SHIELD | Admitting: *Deleted

## 2019-03-22 ENCOUNTER — Telehealth: Payer: Self-pay | Admitting: Obstetrics and Gynecology

## 2019-03-22 ENCOUNTER — Encounter: Payer: BLUE CROSS/BLUE SHIELD | Attending: Obstetrics & Gynecology | Admitting: *Deleted

## 2019-03-22 MED ORDER — ACCU-CHEK GUIDE W/DEVICE KIT: 1.0000 | PACK | Freq: Four times a day (QID) | 0 refills | Status: DC

## 2019-03-22 NOTE — Telephone Encounter (Signed)
The patient stated she missed the call for her appointment. She was told would receive a call back however she has not received on as of yet. Sending a message to the education counselor.

## 2019-03-22 NOTE — Progress Notes (Signed)
I connected with Lorella Wemhoff on 03/20/2019 at  1115  EDT for 30 minutes by telephone at home and verified that I am speaking with the correct person using two identifiers.  Patient was called on 03/20/2019 for Gestational Diabetes self-management. EDD 08/23/2019. Patient states no history of GDM. Diet history obtained. Patient eats fair to good variety of all food groups. Beverages include mostly water with occasional OJ.  She states she works from home. The following learning objectives were met by the patient :   States the definition of Gestational Diabetes  States why dietary management is important in controlling blood glucose  Describes the effects of carbohydrates on blood glucose levels  Demonstrates ability to create a balanced meal plan  Demonstrates carbohydrate counting   States when to check blood glucose levels  Demonstrates proper blood glucose monitoring techniques  States the effect of stress and exercise on blood glucose levels  States the importance of limiting caffeine and abstaining from alcohol and smoking  Plan:  Aim for 3 Carb Choices per meal (45 grams) +/- 1 either way  Aim for 1-2 Carbs per snack Begin reading food labels for Total Carbohydrate of foods If OK with your MD, consider  increasing your activity level by walking, Arm Chair Exercises or other activity daily as tolerated Begin checking BG before breakfast and 2 hours after first bite of breakfast, lunch and dinner as directed by MD  Bring Log Book/Sheet and meter to every medical appointment OR use Baby Scripts (see below) Baby Scripts:  Patient was introduced to [employer] and I asked her to start entering BG into phone via [employer] once she starts checking her BG Take medication if directed by MD  Blood glucose monitor Rx called into pharmacy: Accu Check Guide.  Fast Clix drums and strips had already been ordered. Patient instructed to test pre breakfast and 2 hours each meal as directed  by MD  Patient instructed to monitor glucose levels: FBS: 60 - 95 mg/dl 2 hour: <120 mg/dl  Patient received the following handouts via e-mail:   Nutrition Diabetes and Pregnancy  Carbohydrate Counting List  Patient will be seen for follow-up as needed.

## 2019-03-26 ENCOUNTER — Encounter: Payer: Self-pay | Admitting: Obstetrics and Gynecology

## 2019-03-26 ENCOUNTER — Encounter: Payer: Self-pay | Admitting: General Practice

## 2019-03-26 ENCOUNTER — Other Ambulatory Visit: Payer: Self-pay

## 2019-03-26 ENCOUNTER — Telehealth: Payer: Self-pay | Admitting: Obstetrics and Gynecology

## 2019-03-26 ENCOUNTER — Ambulatory Visit (INDEPENDENT_AMBULATORY_CARE_PROVIDER_SITE_OTHER): Payer: BLUE CROSS/BLUE SHIELD | Admitting: Obstetrics and Gynecology

## 2019-03-26 VITALS — BP 138/86 | HR 95

## 2019-03-26 DIAGNOSIS — O24410 Gestational diabetes mellitus in pregnancy, diet controlled: Secondary | ICD-10-CM

## 2019-03-26 DIAGNOSIS — Z3A32 32 weeks gestation of pregnancy: Secondary | ICD-10-CM

## 2019-03-26 DIAGNOSIS — O24419 Gestational diabetes mellitus in pregnancy, unspecified control: Secondary | ICD-10-CM

## 2019-03-26 DIAGNOSIS — O0990 Supervision of high risk pregnancy, unspecified, unspecified trimester: Secondary | ICD-10-CM

## 2019-03-26 DIAGNOSIS — Z86711 Personal history of pulmonary embolism: Secondary | ICD-10-CM

## 2019-03-26 HISTORY — DX: Gestational diabetes mellitus in pregnancy, unspecified control: O24.419

## 2019-03-26 NOTE — Progress Notes (Signed)
Received notice from Babyscripts patient alerted due to elevated BP 136/90 and 137/88. Per chart review, patient has virtual OB visit today- will address then.

## 2019-03-26 NOTE — Progress Notes (Signed)
   TELEHEALTH VIRTUAL OBSTETRICS VISIT ENCOUNTER NOTE  I connected with Alexis Richardson on 03/26/19 at 11:15 AM EDT by telephone at home and verified that I am speaking with the correct person using two identifiers.   I discussed the limitations, risks, security and privacy concerns of performing an evaluation and management service by telephone and the availability of in person appointments. I also discussed with the patient that there may be a patient responsible charge related to this service. The patient expressed understanding and agreed to proceed.  Subjective:  Alexis Richardson is a 18 y.o. G1P0000 at [redacted]w[redacted]d being followed for ongoing prenatal care.  She is currently monitored for the following issues for this high-risk pregnancy and has History of pulmonary embolus (PE); Keloid of skin; Supervision of high risk pregnancy, antepartum; Rh negative state in antepartum period; Obesity in pregnancy; and Gestational diabetes on their problem list.  Patient reports general discomforts of pregnancy. Reports fetal movement. Denies any contractions, bleeding or leaking of fluid.   The following portions of the patient's history were reviewed and updated as appropriate: allergies, current medications, past family history, past medical history, past social history, past surgical history and problem list.   Objective:   General:  Alert, oriented and cooperative.   Mental Status: Normal mood and affect perceived. Normal judgment and thought content.  Rest of physical exam deferred due to type of encounter  Assessment and Plan:  Pregnancy: G1P0000 at [redacted]w[redacted]d 1. Supervision of high risk pregnancy, antepartum Stable  2. History of pulmonary embolus (PE) Stable Continue with Lovenox  3. Diet controlled gestational diabetes mellitus (GDM) in third trimester Just received glucometer. Pt reports recording CBG's in Baby scripts, but do not see. Carrie to call pt and trouble shoot any problems CBG's in goal  range for the most part on the few she has taken. Importance of CBG's control for reduction of maternal/fetal risks associated with GDM discussed Will follow up via BS in 1 week on CBG's, 2 weeks for televisit  Preterm labor symptoms and general obstetric precautions including but not limited to vaginal bleeding, contractions, leaking of fluid and fetal movement were reviewed in detail with the patient.  I discussed the assessment and treatment plan with the patient. The patient was provided an opportunity to ask questions and all were answered. The patient agreed with the plan and demonstrated an understanding of the instructions. The patient was advised to call back or seek an in-person office evaluation/go to [facility] at [facility] for any urgent or concerning symptoms. Please refer to After Visit Summary for other counseling recommendations.   I provided 11 minutes of non-face-to-face time during this encounter.  No follow-ups on file.  No future appointments.  Hermina Staggers, MD Center for [facility], [facility]

## 2019-03-26 NOTE — Telephone Encounter (Signed)
Called patient to get her scheduled for her next virtual f/u appointment. Patient had concerns on when her next office would be since she is getting closer to her due date. Informed patient that her next office visit will be when she is 36wks which is in 4 weeks. Patient verbalized understanding. Patient also asked how does she get a doctor's note for work saying that she had a visit since it was virtual. Informed patient that I would write the letter for her and it will be able via mychart where she will be able to print it out. Patient verbalized understanding.

## 2019-04-04 ENCOUNTER — Telehealth (INDEPENDENT_AMBULATORY_CARE_PROVIDER_SITE_OTHER): Payer: Medicaid Other | Admitting: Lactation Services

## 2019-04-04 DIAGNOSIS — O24410 Gestational diabetes mellitus in pregnancy, diet controlled: Secondary | ICD-10-CM

## 2019-04-04 NOTE — Telephone Encounter (Signed)
Pt called and left voice mail on nurse phone line. She reports she is 33 weeks and is experiencing pelvic and uterine pain that is radiating down her legs making it difficult to walk. She has taken 2 Tylenol and is not getting relief from it. She would like to know what to do to help with the pain.   Routed to clinical pool to follow up.

## 2019-04-05 NOTE — Telephone Encounter (Signed)
Called patient regarding blood sugars- per review, not much data in babyscripts. Called patient and she states she is taking her blood sugars 4 times a day like she is supposed to, she just forgets to write them down or put them into babyscripts. Patient states her fastings are usually 95-97 maybe 113 if she eats very late at night. Patient reports blood sugars 109-113 for breakfast & lunch and for dinner usually around 123. Told patient those are okay for now. Advised her to try to be vigilent about uploading them into babyscripts immediately following checking her blood sugar so she doesn't forget. Told patient that helps [place] to know what her blood sugars are without contacting her and allows [place] to make adjustments in between appts if need be. Patient verbalized understanding and states she called yesterday and left a message. Patient reports having pelvic/hip pain recently making it difficult to walk at times. Patient also reports pain down low like the baby is hitting her bladder or something. Discussed with patient pelvic pain/hip pain is quite common in pregnancy especially towards the end of pregnancy as the baby moves lower into the pelvis which puts pressure on anything down there. Advised walking around, heating pad, stretching like yoga or extra strength tylenol for relief. Patient verbalized understanding & states she has been doing that and it has helped. Patient had no other questions.

## 2019-04-06 ENCOUNTER — Telehealth (INDEPENDENT_AMBULATORY_CARE_PROVIDER_SITE_OTHER): Payer: Medicaid Other | Admitting: *Deleted

## 2019-04-06 DIAGNOSIS — Z3490 Encounter for supervision of normal pregnancy, unspecified, unspecified trimester: Secondary | ICD-10-CM

## 2019-04-06 NOTE — Telephone Encounter (Signed)
Received call from babyscripts regarding pt's BP.  Pt's BP on 4/30 was 140/95.  Called pt to discuss the blood pressures and determine if she was symptomatic. Pt states she has a minor headache that goes away if she eats or drinks.  Pt denies vision changes.  Pt states she is unable to check her blood pressure at this time but she can check it later today and log it into babyscripts.  Reviewed with Dr. Alysia Penna who recommended that pt recheck.  Pt advised to recheck BP, log into babyscripts, call/mychart message the clinic if 140/90 or greater.  Advised pt to go to MAU if she developed any of the s/s discussed or if BP >160/100.  Pt verbalized understanding.

## 2019-04-09 ENCOUNTER — Ambulatory Visit (INDEPENDENT_AMBULATORY_CARE_PROVIDER_SITE_OTHER): Payer: Medicaid Other | Admitting: Obstetrics & Gynecology

## 2019-04-09 VITALS — BP 132/93 | HR 115

## 2019-04-09 DIAGNOSIS — Z3A34 34 weeks gestation of pregnancy: Secondary | ICD-10-CM

## 2019-04-09 DIAGNOSIS — O0990 Supervision of high risk pregnancy, unspecified, unspecified trimester: Secondary | ICD-10-CM

## 2019-04-09 DIAGNOSIS — O99210 Obesity complicating pregnancy, unspecified trimester: Secondary | ICD-10-CM

## 2019-04-09 DIAGNOSIS — O99213 Obesity complicating pregnancy, third trimester: Secondary | ICD-10-CM | POA: Diagnosis not present

## 2019-04-09 DIAGNOSIS — O24410 Gestational diabetes mellitus in pregnancy, diet controlled: Secondary | ICD-10-CM | POA: Diagnosis not present

## 2019-04-09 DIAGNOSIS — Z86711 Personal history of pulmonary embolism: Secondary | ICD-10-CM | POA: Diagnosis not present

## 2019-04-09 NOTE — Progress Notes (Signed)
[id number] called Priti at 2:08 for her virtual visit and left a message we are calling for your virtual visit and will call again in a few minutes- please be available by phone.  I connected with  Robena Thain on 04/09/19 at  2:15 PM EDT by telephone and verified that I am speaking with the correct person using two identifiers.   I discussed the limitations, risks, security and privacy concerns of performing an evaluation and management service by telephone and the availability of in person appointments. I also discussed with the patient that there may be a patient responsible charge related to this service. The patient expressed understanding and agreed to proceed.   She is active in babyscripts and taking blood pressue - last 04/06/19 = 141/90.  Had her take blood pressure today. She states she has not logged in last day or so of cbg's because she got her phone wet and has it in [place].  Her cbg s from babyscripts:    Simona Huh, RN 04/09/2019  2:15 PM

## 2019-04-09 NOTE — Progress Notes (Signed)
   TELEHEALTH VIRTUAL OBSTETRICS PRENATAL VISIT ENCOUNTER NOTE  I connected with Alexis Richardson on 04/09/19 at  2:15 PM EDT by WebEx at home and verified that I am speaking with the correct person using two identifiers.   I discussed the limitations, risks, security and privacy concerns of performing an evaluation and management service by telephone and the availability of in person appointments. I also discussed with the patient that there may be a patient responsible charge related to this service. The patient expressed understanding and agreed to proceed. Subjective:  Alexis Richardson is a 18 y.o. G1P0000 at [redacted]w[redacted]d being seen today for ongoing prenatal care.  She is currently monitored for the following issues for this high-risk pregnancy and has History of pulmonary embolus (PE); Keloid of skin; Supervision of high risk pregnancy, antepartum; Rh negative state in antepartum period; Obesity in pregnancy; and Gestational diabetes on their problem list.  Patient reports no problems, denies symptoms of pre eclampsia, although she had some "fuzzy" vision several days ago. No problems since. She is hoping that she can be induced at 39 weeks. Reports fetal movement. Contractions: Not present. Vag. Bleeding: None.  Movement: Present. Denies any contractions, bleeding or leaking of fluid.   The following portions of the patient's history were reviewed and updated as appropriate: allergies, current medications, past family history, past medical history, past social history, past surgical history and problem list.   Objective:   Vitals:   04/09/19 1417  BP: (!) 132/93  Pulse: (!) 115    Fetal Status:     Movement: Present     General:  Alert, oriented and cooperative. Patient is in no acute distress.  Respiratory: Normal respiratory effort, no problems with respiration noted  Mental Status: Normal mood and affect. Normal behavior. Normal judgment and thought content.  Rest of physical exam deferred due  to type of encounter  Assessment and Plan:  Pregnancy: G1P0000 at [redacted]w[redacted]d 1. Supervision of high risk pregnancy, antepartum - followup MFM u/s ordered  2. Diet controlled gestational diabetes mellitus (GDM) in third trimester - most fastings are in the 90s, 2 hour PCs generally in range  3. History of pulmonary embolus (PE) - on lovenox  4. Obesity in pregnancy   Preterm labor symptoms and general obstetric precautions including but not limited to vaginal bleeding, contractions, leaking of fluid and fetal movement were reviewed in detail with the patient. I discussed the assessment and treatment plan with the patient. The patient was provided an opportunity to ask questions and all were answered. The patient agreed with the plan and demonstrated an understanding of the instructions. The patient was advised to call back or seek an in-person office evaluation/go to [facility]U at [facility] for any urgent or concerning symptoms. Please refer to After Visit Summary for other counseling recommendations.   I provided 10 minutes of face-to-face via WebEx time during this encounter.  No follow-ups on file.  No future appointments.  Allie Bossier, MD [facility] [employer], [facility]

## 2019-04-12 ENCOUNTER — Inpatient Hospital Stay (HOSPITAL_COMMUNITY)
Admission: AD | Admit: 2019-04-12 | Discharge: 2019-04-13 | Disposition: A | Discharge status: Home / Self Care | Payer: BLUE CROSS/BLUE SHIELD | Attending: Obstetrics and Gynecology | Admitting: Obstetrics and Gynecology

## 2019-04-12 ENCOUNTER — Encounter (HOSPITAL_COMMUNITY): Payer: Self-pay | Admitting: *Deleted

## 2019-04-12 ENCOUNTER — Other Ambulatory Visit: Payer: Self-pay

## 2019-04-12 DIAGNOSIS — O133 Gestational [pregnancy-induced] hypertension without significant proteinuria, third trimester: Secondary | ICD-10-CM | POA: Diagnosis not present

## 2019-04-12 DIAGNOSIS — Z794 Long term (current) use of insulin: Secondary | ICD-10-CM | POA: Insufficient documentation

## 2019-04-12 DIAGNOSIS — O24414 Gestational diabetes mellitus in pregnancy, insulin controlled: Secondary | ICD-10-CM | POA: Diagnosis not present

## 2019-04-12 DIAGNOSIS — R109 Unspecified abdominal pain: Secondary | ICD-10-CM | POA: Diagnosis not present

## 2019-04-12 DIAGNOSIS — M549 Dorsalgia, unspecified: Secondary | ICD-10-CM | POA: Diagnosis not present

## 2019-04-12 DIAGNOSIS — Z3A34 34 weeks gestation of pregnancy: Secondary | ICD-10-CM | POA: Insufficient documentation

## 2019-04-12 DIAGNOSIS — O99210 Obesity complicating pregnancy, unspecified trimester: Secondary | ICD-10-CM

## 2019-04-12 DIAGNOSIS — Z86711 Personal history of pulmonary embolism: Secondary | ICD-10-CM | POA: Diagnosis not present

## 2019-04-12 DIAGNOSIS — O99213 Obesity complicating pregnancy, third trimester: Secondary | ICD-10-CM | POA: Diagnosis not present

## 2019-04-12 DIAGNOSIS — O26893 Other specified pregnancy related conditions, third trimester: Secondary | ICD-10-CM | POA: Diagnosis present

## 2019-04-12 LAB — URINALYSIS, ROUTINE W REFLEX MICROSCOPIC
Bilirubin Urine: NEGATIVE
Glucose, UA: NEGATIVE mg/dL
Hgb urine dipstick: NEGATIVE
Ketones, ur: 20 mg/dL — AB
Nitrite: NEGATIVE
Protein, ur: NEGATIVE mg/dL
Specific Gravity, Urine: 1.01 (ref 1.005–1.030)
pH: 6 (ref 5.0–8.0)

## 2019-04-12 LAB — WET PREP, GENITAL
Clue Cells Wet Prep HPF POC: NONE SEEN
Sperm: NONE SEEN
Trich, Wet Prep: NONE SEEN
Yeast Wet Prep HPF POC: NONE SEEN

## 2019-04-12 NOTE — MAU Provider Note (Signed)
History     CSN: [id number]  Arrival date and time: 04/12/19 2145   First Provider Initiated Contact with Patient 04/12/19 2229      No chief complaint on file.  Alexis Richardson is a 18 y.o. G1P0 at 73w4dwho presents for stomach and back pain.  Patient states her pain started around 8pm and has been constant.  She describes the pain as a aching pain that is intensified with fetal movement, which is good. She has not taken anything for the pain and rates it a 4/10. Patient denies sexual activity in the last 72 hours as well as vaginal concerns including bleeding, discharge, itching, burning, or odor.  She is unsure of whether or not she is experiencing contractions as states "I just feel pain all the time." Patient states she has not taken her Lovenox today and her last BS was 123.     OB History    Gravida  1   Para  0   Term  0   Preterm  0   AB  0   Living  0     SAB  0   TAB  0   Ectopic  0   Multiple  0   Live Births  0           Past Medical History:  Diagnosis Date  . Pulmonary embolism ([place]ana    a. Dx 09/2017, had IUD removed in case this contributed.  . Right ventricular dilation    a. at time of PE 09/2017.    Past Surgical History:  Procedure Laterality Date  . KELOID EXCISION      Family History  Problem Relation Age of Onset  . Hypertension Mother   . Hypertension Maternal Aunt   . Cancer Maternal Grandfather   . Pulmonary embolism Neg Hx   . Heart disease Neg Hx     Social History   Tobacco Use  . Smoking status: Never Smoker  . Smokeless tobacco: Never Used  Substance Use Topics  . Alcohol use: No  . Drug use: Not Currently    Types: Marijuana    Comment: LAST TIME SMOKED - BEFORE PREG    Allergies: No Known Allergies  Medications Prior to Admission  Medication Sig Dispense Refill Last Dose  . enoxaparin (LOVENOX) 40 MG/0.4ML injection Inject 0.4 mLs (40 mg total) into the skin daily. 90 Syringe 4 04/11/2019 at Unknown time   . Prenatal Multivit-Min-Fe-FA (PRENATAL VITAMINS) 0.8 MG tablet Take 1 tablet by mouth daily. 30 tablet 12 04/12/2019 at Unknown time  . Accu-Chek FastClix Lancets MISC 1 each by Percutaneous route 4 (four) times daily. 100 each 12 Taking  . Blood Glucose Monitoring Suppl (ACCU-CHEK GUIDE) w/Device KIT 1 Device by Does not apply route 4 (four) times daily. 1 kit 0 Taking  . glucose blood (ACCU-CHEK GUIDE) test strip Dispense 2 boxes of 50. Use four times per day 50 each 12 Taking    Review of Systems  Constitutional: Negative for chills and fever.  Respiratory: Negative for cough and shortness of breath.   Gastrointestinal: Negative for constipation, diarrhea, nausea and vomiting.  Genitourinary: Negative for dysuria, vaginal bleeding and vaginal discharge.  Neurological: Negative for dizziness, light-headedness and headaches.   Physical Exam   Blood pressure 136/80, pulse 95, temperature 98.2 F (36.8 C), temperature source Oral, resp. rate 20, height 5' 3" (1.6 m), weight 105.5 kg, last menstrual period 08/13/2018.  Physical Exam  Constitutional: She is oriented to person, place,  and time. She appears well-developed and well-nourished.  HENT:  Head: Normocephalic and atraumatic.  Eyes: Conjunctivae are normal.  Neck: Normal range of motion.  Cardiovascular: Normal rate.  Respiratory: Effort normal.  GI: Soft.  Genitourinary: Cervix exhibits discharge and friability. Cervix exhibits no motion tenderness.    No vaginal discharge or bleeding.  No bleeding in the vagina.    Genitourinary Comments: Sterile Speculum Exam: -Vaginal Vault: Pink mucosa.  No apparent discharge -wet prep collected -Cervix:Pink, no lesions or polyps. Multiple Fluid filled cysts noted. Friable-HSV culture collected.  Cervix appears closed. No active bleeding, Yellowish mucoid discharge noted from os-GC/CT collected -Bimanual Exam: Closed Tenderness at cervix.   Musculoskeletal: Normal range of motion.   Neurological: She is alert and oriented to person, place, and time.  Skin: Skin is warm and dry.  Psychiatric: She has a normal mood and affect. Her behavior is normal.    Fetal Assessment 145 bpm, Mod Var, -Decels, +Accels Toco: Q66mn, palpates  MAU Course   Results for orders placed or performed during the hospital encounter of 04/12/19 (from the past 24 hour(s))  Urinalysis, Routine w reflex microscopic     Status: Abnormal   Collection Time: 04/12/19 10:17 PM  Result Value Ref Range   Color, Urine YELLOW YELLOW   APPearance CLEAR CLEAR   Specific Gravity, Urine 1.010 1.005 - 1.030   pH 6.0 5.0 - 8.0   Glucose, UA NEGATIVE NEGATIVE mg/dL   Hgb urine dipstick NEGATIVE NEGATIVE   Bilirubin Urine NEGATIVE NEGATIVE   Ketones, ur 20 (A) NEGATIVE mg/dL   Protein, ur NEGATIVE NEGATIVE mg/dL   Nitrite NEGATIVE NEGATIVE   Leukocytes,Ua TRACE (A) NEGATIVE   RBC / HPF 0-5 0 - 5 RBC/hpf   WBC, UA 0-5 0 - 5 WBC/hpf   Bacteria, UA RARE (A) NONE SEEN   Squamous Epithelial / LPF 0-5 0 - 5   Mucus PRESENT   Wet prep, genital     Status: Abnormal   Collection Time: 04/12/19 10:39 PM  Result Value Ref Range   Yeast Wet Prep HPF POC NONE SEEN NONE SEEN   Trich, Wet Prep NONE SEEN NONE SEEN   Clue Cells Wet Prep HPF POC NONE SEEN NONE SEEN   WBC, Wet Prep HPF POC FEW (A) NONE SEEN   Sperm NONE SEEN    No results found.  MDM PE Labs:UA, GC/CT, Wet Prep, PC Ratio, CBC, CMP, HSV Culture EFM Assessment and Plan  18year old  G1P0 at 3774weeks Cat I FT Back Pain Ectropion vs Herpetic Cervix  -Exam findings discussed -Offered and declines pain medication -Wet prep, HSV, and GC/CT collected and sent -NST Reactive, Can discontinue EFM. -Will continue monitor and await results.  Follow Up (11:50 PM) Elevated BP  -Wet prep returns with insignificant findings. -Results discussed with patient. -Informed that GC/CT and HSV results will return within 2-3 days. -Questions and  concerns regarding exam findings and bp addressed. -Extensive discussion regarding effects of active HSV infection during vaginal delivery. -Encouraged to not worry and provider will send results via mychart. -Patient reports she was being monitored for increasing BP via babyscripts.  -Nurse instructed to cycle BP. -Will collect baseline PreE labs. -Labor and PreEclampsia precautions discussed. -Instructed to call and schedule appt for next televisit. -Encouraged to call or return to MAU if symptoms worsen or with the onset of new symptoms. -Will discharge to home after collection of labwork.  JMaryann ConnersMSN, CNM 04/12/2019, 10:29 PM  Addendum -PreE labs return normal. -Diagnosis of GHTN given and patient notified prior to discharge.  -Message sent to [facility] clinic for scheduling of ROB appt.    Results for orders placed or performed during the hospital encounter of 04/12/19 (from the past 24 hour(s))  Urinalysis, Routine w reflex microscopic     Status: Abnormal   Collection Time: 04/12/19 10:17 PM  Result Value Ref Range   Color, Urine YELLOW YELLOW   APPearance CLEAR CLEAR   Specific Gravity, Urine 1.010 1.005 - 1.030   pH 6.0 5.0 - 8.0   Glucose, UA NEGATIVE NEGATIVE mg/dL   Hgb urine dipstick NEGATIVE NEGATIVE   Bilirubin Urine NEGATIVE NEGATIVE   Ketones, ur 20 (A) NEGATIVE mg/dL   Protein, ur NEGATIVE NEGATIVE mg/dL   Nitrite NEGATIVE NEGATIVE   Leukocytes,Ua TRACE (A) NEGATIVE   RBC / HPF 0-5 0 - 5 RBC/hpf   WBC, UA 0-5 0 - 5 WBC/hpf   Bacteria, UA RARE (A) NONE SEEN   Squamous Epithelial / LPF 0-5 0 - 5   Mucus PRESENT   Wet prep, genital     Status: Abnormal   Collection Time: 04/12/19 10:39 PM  Result Value Ref Range   Yeast Wet Prep HPF POC NONE SEEN NONE SEEN   Trich, Wet Prep NONE SEEN NONE SEEN   Clue Cells Wet Prep HPF POC NONE SEEN NONE SEEN   WBC, Wet Prep HPF POC FEW (A) NONE SEEN   Sperm NONE SEEN   Protein / creatinine ratio, urine     Status:  None   Collection Time: 04/12/19 11:43 PM  Result Value Ref Range   Creatinine, Urine 100.89 mg/dL   Total Protein, Urine <6 mg/dL   Protein Creatinine Ratio        0.00 - 0.15 mg/mg[Cre]  CBC     Status: Abnormal   Collection Time: 04/13/19 12:02 AM  Result Value Ref Range   WBC 9.4 4.0 - 10.5 K/uL   RBC 3.95 3.87 - 5.11 MIL/uL   Hemoglobin 11.2 (L) 12.0 - 15.0 g/dL   HCT 33.4 (L) 36.0 - 46.0 %   MCV 84.6 80.0 - 100.0 fL   MCH 28.4 26.0 - 34.0 pg   MCHC 33.5 30.0 - 36.0 g/dL   RDW 13.4 11.5 - 15.5 %   Platelets 149 (L) 150 - 400 K/uL   nRBC 0.0 0.0 - 0.2 %  Comprehensive metabolic panel     Status: Abnormal   Collection Time: 04/13/19 12:02 AM  Result Value Ref Range   Sodium 136 135 - 145 mmol/L   Potassium 3.6 3.5 - 5.1 mmol/L   Chloride 106 98 - 111 mmol/L   CO2 19 (L) 22 - 32 mmol/L   Glucose, Bld 73 70 - 99 mg/dL   BUN 5 (L) 6 - 20 mg/dL   Creatinine, Ser 0.64 0.44 - 1.00 mg/dL   Calcium 9.5 8.9 - 10.3 mg/dL   Total Protein 6.4 (L) 6.5 - 8.1 g/dL   Albumin 2.8 (L) 3.5 - 5.0 g/dL   AST 18 15 - 41 U/L   ALT 13 0 - 44 U/L   Alkaline Phosphatase 76 38 - 126 U/L   Total Bilirubin 0.3 0.3 - 1.2 mg/dL   GFR calc non Af Amer >60 >60 mL/min   GFR calc Af Amer >60 >60 mL/min   Anion gap 11 5 - 15

## 2019-04-12 NOTE — MAU Note (Addendum)
PT SAYS SHE FEELS PAIN IN ABD -AT 8PM. NOW PAIN IN HER BACK.  PNC WITH  CLINIC ON [facility].    NO VE .   DENIES HSV AND MRSA.   LAST SEX-  - LAST WEEK

## 2019-04-13 DIAGNOSIS — O26893 Other specified pregnancy related conditions, third trimester: Secondary | ICD-10-CM

## 2019-04-13 DIAGNOSIS — O133 Gestational [pregnancy-induced] hypertension without significant proteinuria, third trimester: Secondary | ICD-10-CM

## 2019-04-13 DIAGNOSIS — R109 Unspecified abdominal pain: Secondary | ICD-10-CM

## 2019-04-13 DIAGNOSIS — O99213 Obesity complicating pregnancy, third trimester: Secondary | ICD-10-CM | POA: Diagnosis not present

## 2019-04-13 DIAGNOSIS — Z3A34 34 weeks gestation of pregnancy: Secondary | ICD-10-CM

## 2019-04-13 HISTORY — DX: Gestational (pregnancy-induced) hypertension without significant proteinuria, third trimester: O13.3

## 2019-04-13 LAB — PROTEIN / CREATININE RATIO, URINE
Creatinine, Urine: 100.89 mg/dL
Total Protein, Urine: <6 mg/dL

## 2019-04-13 LAB — COMPREHENSIVE METABOLIC PANEL
ALT: 13 U/L (ref 0–44)
AST: 18 U/L (ref 15–41)
Albumin: 2.8 g/dL — ABNORMAL LOW (ref 3.5–5.0)
Alkaline Phosphatase: 76 U/L (ref 38–126)
Anion gap: 11 (ref 5–15)
BUN: 5 mg/dL — ABNORMAL LOW (ref 6–20)
CO2: 19 mmol/L — ABNORMAL LOW (ref 22–32)
Calcium: 9.5 mg/dL (ref 8.9–10.3)
Chloride: 106 mmol/L (ref 98–111)
Creatinine, Ser: 0.64 mg/dL (ref 0.44–1.00)
GFR calc Af Amer: >60 mL/min (ref >60)
GFR calc non Af Amer: >60 mL/min (ref >60)
Glucose, Bld: 73 mg/dL (ref 70–99)
Potassium: 3.6 mmol/L (ref 3.5–5.1)
Sodium: 136 mmol/L (ref 135–145)
Total Bilirubin: 0.3 mg/dL (ref 0.3–1.2)
Total Protein: 6.4 g/dL — ABNORMAL LOW (ref 6.5–8.1)

## 2019-04-13 LAB — CBC
HCT: 33.4 % — ABNORMAL LOW (ref 36.0–46.0)
Hemoglobin: 11.2 g/dL — ABNORMAL LOW (ref 12.0–15.0)
MCH: 28.4 pg (ref 26.0–34.0)
MCHC: 33.5 g/dL (ref 30.0–36.0)
MCV: 84.6 fL (ref 80.0–100.0)
Platelets: 149 10*3/uL — ABNORMAL LOW (ref 150–400)
RBC: 3.95 MIL/uL (ref 3.87–5.11)
RDW: 13.4 % (ref 11.5–15.5)
WBC: 9.4 10*3/uL (ref 4.0–10.5)
nRBC: 0 % (ref 0.0–0.2)

## 2019-04-13 LAB — GC/CHLAMYDIA PROBE AMP (~~LOC~~) NOT AT ARMC
Chlamydia: NEGATIVE
Neisseria Gonorrhea: NEGATIVE

## 2019-04-13 NOTE — Discharge Instructions (Signed)
Abdominal Pain During Pregnancy ° °Abdominal pain is common during pregnancy, and has many possible causes. Some causes are more serious than others, and sometimes the cause is not known. Abdominal pain can be a sign that labor is starting. It can also be caused by normal growth and stretching of muscles and ligaments during pregnancy. Always tell your health care provider if you have any abdominal pain. °Follow these instructions at home: °· Do not have sex or put anything in your vagina until your pain goes away completely. °· Get plenty of rest until your pain improves. °· Drink enough fluid to keep your urine pale yellow. °· Take over-the-counter and prescription medicines only as told by your health care provider. °· Keep all follow-up visits as told by your health care provider. This is important. °Contact a health care provider if: °· Your pain continues or gets worse after resting. °· You have lower abdominal pain that: °? Comes and goes at regular intervals. °? Spreads to your back. °? Is similar to menstrual cramps. °· You have pain or burning when you urinate. °Get help right away if: °· You have a fever or chills. °· You have vaginal bleeding. °· You are leaking fluid from your vagina. °· You are passing tissue from your vagina. °· You have vomiting or diarrhea that lasts for more than 24 hours. °· Your baby is moving less than usual. °· You feel very weak or faint. °· You have shortness of breath. °· You develop severe pain in your upper abdomen. °Summary °· Abdominal pain is common during pregnancy, and has many possible causes. °· If you experience abdominal pain during pregnancy, tell your health care provider right away. °· Follow your health care provider's home care instructions and keep all follow-up visits as directed. °This information is not intended to replace advice given to you by your health care provider. Make sure you discuss any questions you have with your health care  provider. °Document Released: 11/22/2005 Document Revised: 02/24/2017 Document Reviewed: 02/24/2017 °Elsevier Interactive Patient Education © 2019 Elsevier Inc. ° ° °Hypertension During Pregnancy ° °Hypertension, commonly called high blood pressure, is when the force of blood pumping through your arteries is too strong. Arteries are blood vessels that carry blood from the heart throughout the body. Hypertension during pregnancy can cause problems for you and your baby. Your baby may be born early (prematurely) or may not weigh as much as he or she should at birth. Very bad cases of hypertension during pregnancy can be life-threatening. °Different types of hypertension can occur during pregnancy. These include: °· Chronic hypertension. This happens when: °? You have hypertension before pregnancy and it continues during pregnancy. °? You develop hypertension before you are [redacted] weeks pregnant, and it continues during pregnancy. °· Gestational hypertension. This is hypertension that develops after the 20th week of pregnancy. °· Preeclampsia, also called toxemia of pregnancy. This is a very serious type of hypertension that develops during pregnancy. It can be very dangerous for you and your baby. °? In rare cases, you may develop preeclampsia after giving birth (postpartum preeclampsia). This usually occurs within 48 hours after childbirth but may occur up to 6 weeks after giving birth. °Gestational hypertension and preeclampsia usually go away within 6 weeks after your baby is born. Women who have hypertension during pregnancy have a greater chance of developing hypertension later in life or during future pregnancies. °What are the causes? °The exact cause of hypertension during pregnancy is not known. °What increases   risk? There are certain factors that make it more likely for you to develop hypertension during pregnancy. These include:  Having hypertension during a previous pregnancy or prior to  pregnancy.  Being overweight.  Being age 41 or older.  Being pregnant for the first time.  Being pregnant with more than one baby.  Becoming pregnant using fertilization methods such as IVF (in vitro fertilization).  Having diabetes, kidney problems, or systemic lupus erythematosus.  Having a family history of hypertension. What are the signs or symptoms? Chronic hypertension and gestational hypertension rarely cause symptoms. Preeclampsia causes symptoms, which may include:  Increased protein in your urine. Your health care provider will check for this at every visit before you give birth (prenatal visit).  Severe headaches.  Sudden weight gain.  Swelling of the hands, face, legs, and feet.  Nausea and vomiting.  Vision problems, such as blurred or double vision.  Numbness in the face, arms, legs, and feet.  Dizziness.  Slurred speech.  Sensitivity to bright lights.  Abdominal pain.  Convulsions or seizures. How is this diagnosed? You may be diagnosed with hypertension during a routine prenatal exam. At each prenatal visit, you may:  Have a urine test to check for high amounts of protein in your urine.  Have your blood pressure checked. A blood pressure reading is given as two numbers, such as "120 over 80" (or 120/80). The first ("top") number is a measure of the pressure in your arteries when your heart beats (systolic pressure). The second ("bottom") number is a measure of the pressure in your arteries as your heart relaxes between beats (diastolic pressure). Blood pressure is measured in a unit called mm Hg. For most women, a normal blood pressure reading is: ? Systolic: below 120. ? Diastolic: below 80. The type of hypertension that you are diagnosed with depends on your test results and when your symptoms developed.  Chronic hypertension is usually diagnosed before 20 weeks of pregnancy.  Gestational hypertension is usually diagnosed after 20 weeks of  pregnancy.  Hypertension with high amounts of protein in the urine is diagnosed as preeclampsia.  Blood pressure measurements that stay above 160 systolic, or above 110 diastolic, are signs of severe preeclampsia. How is this treated? Treatment for hypertension during pregnancy varies depending on the type of hypertension you have and how serious it is.  If you take medicines called ACE inhibitors to treat chronic hypertension, you may need to switch medicines. ACE inhibitors should not be taken during pregnancy.  If you have gestational hypertension, you may need to take blood pressure medicine.  If you are at risk for preeclampsia, your health care provider may recommend that you take a low-dose aspirin during your pregnancy.  If you have severe preeclampsia, you may need to be hospitalized so you and your baby can be monitored closely. You may also need to take medicine (magnesium sulfate) to prevent seizures and to lower blood pressure. This medicine may be given as an injection or through an IV.  In some cases, if your condition gets worse, you may need to deliver your baby early. Follow these instructions at home: Eating and drinking   Drink enough fluid to keep your urine pale yellow.  Avoid caffeine. Lifestyle  Do not use any products that contain nicotine or tobacco, such as cigarettes and e-cigarettes. If you need help quitting, ask your health care provider.  Do not use alcohol or drugs.  Avoid stress as much as possible. Rest and get plenty  plenty of sleep. °General instructions °· Take over-the-counter and prescription medicines only as told by your health care provider. °· While lying down, lie on your left side. This keeps pressure off your major blood vessels. °· While sitting or lying down, raise (elevate) your feet. Try putting some pillows under your lower legs. °· Exercise regularly. Ask your health care provider what kinds of exercise are best for you. °· Keep all prenatal  and follow-up visits as told by your health care provider. This is important. °Contact a health care provider if: °· You have symptoms that your health care provider told you may require more treatment or monitoring, such as: °? Nausea or vomiting. °? Headache. °Get help right away if you have: °· Severe abdominal pain that does not get better with treatment. °· A severe headache that does not get better. °· Vomiting that does not get better. °· Sudden, rapid weight gain. °· Sudden swelling in your hands, ankles, or face. °· Vaginal bleeding. °· Blood in your urine. °· Fewer movements from your baby than usual. °· Blurred or double vision. °· Muscle twitching or sudden muscle tightening (spasms). °· Shortness of breath. °· Blue fingernails or lips. °Summary °· Hypertension, commonly called high blood pressure, is when the force of blood pumping through your arteries is too strong. °· Hypertension during pregnancy can cause problems for you and your baby. °· Treatment for hypertension during pregnancy varies depending on the type of hypertension you have and how serious it is. °· Get help right away if you have symptoms that your health care provider told you to watch for. °This information is not intended to replace advice given to you by your health care provider. Make sure you discuss any questions you have with your health care provider. °Document Released: 08/10/2011 Document Revised: 11/08/2017 Document Reviewed: 05/07/2016 °Elsevier Interactive Patient Education © 2019 Elsevier Inc. ° °

## 2019-04-16 LAB — HSV CULTURE AND TYPING

## 2019-04-20 ENCOUNTER — Other Ambulatory Visit: Payer: Self-pay

## 2019-04-20 ENCOUNTER — Telehealth: Payer: Medicaid Other | Admitting: Obstetrics & Gynecology

## 2019-04-20 NOTE — Progress Notes (Signed)
Attempted to call pt x2 with no success. Unable to leave a voicemail due to voicemail box being full. Provider made aware.

## 2019-04-20 NOTE — Progress Notes (Signed)
dnka

## 2019-04-25 ENCOUNTER — Other Ambulatory Visit: Payer: Self-pay

## 2019-04-25 ENCOUNTER — Other Ambulatory Visit (HOSPITAL_COMMUNITY)
Admission: RE | Admit: 2019-04-25 | Discharge: 2019-04-25 | Disposition: A | Discharge status: Home / Self Care | Payer: Medicaid Other | Source: Ambulatory Visit | Attending: Obstetrics & Gynecology | Admitting: Obstetrics & Gynecology

## 2019-04-25 ENCOUNTER — Ambulatory Visit (HOSPITAL_COMMUNITY): Payer: Medicaid Other | Admitting: *Deleted

## 2019-04-25 ENCOUNTER — Ambulatory Visit (INDEPENDENT_AMBULATORY_CARE_PROVIDER_SITE_OTHER): Payer: Medicaid Other | Admitting: Obstetrics & Gynecology

## 2019-04-25 ENCOUNTER — Ambulatory Visit (HOSPITAL_COMMUNITY)
Admission: RE | Admit: 2019-04-25 | Discharge: 2019-04-25 | Disposition: A | Discharge status: Home / Self Care | Payer: Medicaid Other | Source: Ambulatory Visit | Attending: Obstetrics and Gynecology | Admitting: Obstetrics and Gynecology

## 2019-04-25 ENCOUNTER — Encounter (HOSPITAL_COMMUNITY): Payer: Self-pay

## 2019-04-25 VITALS — BP 137/90 | HR 103 | Temp 98.2°F | Wt 239.5 lb

## 2019-04-25 VITALS — BP 138/78 | HR 103 | Temp 98.7°F

## 2019-04-25 DIAGNOSIS — O99213 Obesity complicating pregnancy, third trimester: Secondary | ICD-10-CM

## 2019-04-25 DIAGNOSIS — Z362 Encounter for other antenatal screening follow-up: Secondary | ICD-10-CM | POA: Diagnosis not present

## 2019-04-25 DIAGNOSIS — O24410 Gestational diabetes mellitus in pregnancy, diet controlled: Secondary | ICD-10-CM

## 2019-04-25 DIAGNOSIS — O99210 Obesity complicating pregnancy, unspecified trimester: Secondary | ICD-10-CM

## 2019-04-25 DIAGNOSIS — Z3A36 36 weeks gestation of pregnancy: Secondary | ICD-10-CM

## 2019-04-25 DIAGNOSIS — O2693 Pregnancy related conditions, unspecified, third trimester: Secondary | ICD-10-CM | POA: Diagnosis not present

## 2019-04-25 DIAGNOSIS — O0990 Supervision of high risk pregnancy, unspecified, unspecified trimester: Secondary | ICD-10-CM | POA: Insufficient documentation

## 2019-04-25 DIAGNOSIS — Z86711 Personal history of pulmonary embolism: Secondary | ICD-10-CM | POA: Diagnosis not present

## 2019-04-25 DIAGNOSIS — O360130 Maternal care for anti-D [Rh] antibodies, third trimester, not applicable or unspecified: Secondary | ICD-10-CM

## 2019-04-25 DIAGNOSIS — O133 Gestational [pregnancy-induced] hypertension without significant proteinuria, third trimester: Secondary | ICD-10-CM

## 2019-04-25 LAB — GLUCOSE, CAPILLARY: Glucose-Capillary: 73 mg/dL (ref 70–99)

## 2019-04-25 NOTE — Progress Notes (Signed)
Pt not logging blood sugars into babyscripts because she states her phone is broken.  Pt states she has been writing them down but did not bring her log with her. Pt states she is checking her blood pressures at home and they have been in the 140's/90's but she has not been logging them into babyscripts d/t the problems with her phone.  Pt denies headache or visual changes.

## 2019-04-25 NOTE — Progress Notes (Signed)
   PRENATAL VISIT NOTE  Subjective:  Alexis Richardson is a 18 y.o. G1P0000 at [redacted]w[redacted]d being seen today for ongoing prenatal care.  She is currently monitored for the following issues for this high-risk pregnancy and has History of pulmonary embolus (PE); Keloid of skin; Supervision of high risk pregnancy, antepartum; Rh negative state in antepartum period; Obesity in pregnancy; Gestational diabetes; and Gestational hypertension, third trimester on their problem list.  Patient reports no complaints.  Contractions: Irregular. Vag. Bleeding: None.  Movement: Present. Denies leaking of fluid.   The following portions of the patient's history were reviewed and updated as appropriate: allergies, current medications, past family history, past medical history, past social history, past surgical history and problem list.   Objective:   Vitals:   04/25/19 1625  BP: 137/90  Pulse: (!) 103  Temp: 98.2 F (36.8 C)  Weight: 239 lb 8 oz (108.6 kg)    Fetal Status: Fetal Heart Rate (bpm): 157   Movement: Present     General:  Alert, oriented and cooperative. Patient is in no acute distress.  Skin: Skin is warm and dry. No rash noted.   Cardiovascular: Normal heart rate noted  Respiratory: Normal respiratory effort, no problems with respiration noted  Abdomen: Soft, gravid, appropriate for gestational age.  Pain/Pressure: Present     Pelvic: Cervical exam performed        Extremities: Normal range of motion.  Edema: Trace  Mental Status: Normal mood and affect. Normal behavior. Normal judgment and thought content.  DTR 2+ Assessment and Plan:  Pregnancy: G1P0000 at [redacted]w[redacted]d 1. Supervision of high risk pregnancy, antepartum  - Cervicovaginal ancillary only( [place]) - Culture, beta strep (group b only)  2. History of pulmonary embolus (PE) - on lovenox  3. Diet controlled gestational diabetes mellitus (GDM) in third trimester - not logging her sugars, didn't bring meter or sugars - I have  encouraged her to do so  4. Obesity in pregnancy   5. Gestational hypertension, third trimester - pre eclampsia precautions reviews  Preterm labor symptoms and general obstetric precautions including but not limited to vaginal bleeding, contractions, leaking of fluid and fetal movement were reviewed in detail with the patient. Please refer to After Visit Summary for other counseling recommendations.   No follow-ups on file.  Future Appointments  Date Time Provider Department Center  05/04/2019  9:15 AM Allie Bossier, MD [facility]    Allie Bossier, MD

## 2019-04-27 LAB — CERVICOVAGINAL ANCILLARY ONLY
Chlamydia: NEGATIVE
Neisseria Gonorrhea: NEGATIVE

## 2019-04-28 LAB — CULTURE, BETA STREP (GROUP B ONLY): Strep Gp B Culture: POSITIVE — AB

## 2019-05-01 ENCOUNTER — Encounter: Payer: Self-pay | Admitting: Obstetrics & Gynecology

## 2019-05-01 DIAGNOSIS — O99820 Streptococcus B carrier state complicating pregnancy: Secondary | ICD-10-CM | POA: Insufficient documentation

## 2019-05-02 ENCOUNTER — Encounter (HOSPITAL_COMMUNITY): Payer: Self-pay

## 2019-05-02 ENCOUNTER — Inpatient Hospital Stay (EMERGENCY_DEPARTMENT_HOSPITAL)
Admission: AD | Admit: 2019-05-02 | Discharge: 2019-05-02 | Disposition: A | Discharge status: Home / Self Care | Payer: BLUE CROSS/BLUE SHIELD | Source: Home / Self Care | Attending: Obstetrics and Gynecology | Admitting: Obstetrics and Gynecology

## 2019-05-02 ENCOUNTER — Other Ambulatory Visit: Payer: Self-pay

## 2019-05-02 DIAGNOSIS — O133 Gestational [pregnancy-induced] hypertension without significant proteinuria, third trimester: Secondary | ICD-10-CM | POA: Diagnosis not present

## 2019-05-02 DIAGNOSIS — R51 Headache: Secondary | ICD-10-CM | POA: Insufficient documentation

## 2019-05-02 DIAGNOSIS — Z86711 Personal history of pulmonary embolism: Secondary | ICD-10-CM | POA: Insufficient documentation

## 2019-05-02 DIAGNOSIS — O134 Gestational [pregnancy-induced] hypertension without significant proteinuria, complicating childbirth: Secondary | ICD-10-CM | POA: Diagnosis not present

## 2019-05-02 DIAGNOSIS — Z3A37 37 weeks gestation of pregnancy: Secondary | ICD-10-CM

## 2019-05-02 DIAGNOSIS — O26893 Other specified pregnancy related conditions, third trimester: Secondary | ICD-10-CM | POA: Insufficient documentation

## 2019-05-02 DIAGNOSIS — O471 False labor at or after 37 completed weeks of gestation: Secondary | ICD-10-CM | POA: Insufficient documentation

## 2019-05-02 DIAGNOSIS — O163 Unspecified maternal hypertension, third trimester: Secondary | ICD-10-CM | POA: Insufficient documentation

## 2019-05-02 LAB — PROTEIN / CREATININE RATIO, URINE
Creatinine, Urine: 103.72 mg/dL
Total Protein, Urine: <6 mg/dL

## 2019-05-02 LAB — URINALYSIS, ROUTINE W REFLEX MICROSCOPIC
Bilirubin Urine: NEGATIVE
Glucose, UA: NEGATIVE mg/dL
Hgb urine dipstick: NEGATIVE
Ketones, ur: NEGATIVE mg/dL
Nitrite: NEGATIVE
Protein, ur: NEGATIVE mg/dL
Specific Gravity, Urine: 1.009 (ref 1.005–1.030)
pH: 6 (ref 5.0–8.0)

## 2019-05-02 LAB — CBC
HCT: 33.4 % — ABNORMAL LOW (ref 36.0–46.0)
Hemoglobin: 11.2 g/dL — ABNORMAL LOW (ref 12.0–15.0)
MCH: 28 pg (ref 26.0–34.0)
MCHC: 33.5 g/dL (ref 30.0–36.0)
MCV: 83.5 fL (ref 80.0–100.0)
Platelets: 147 K/uL — ABNORMAL LOW (ref 150–400)
RBC: 4 MIL/uL (ref 3.87–5.11)
RDW: 14 % (ref 11.5–15.5)
WBC: 8.4 K/uL (ref 4.0–10.5)
nRBC: 0 % (ref 0.0–0.2)

## 2019-05-02 LAB — COMPREHENSIVE METABOLIC PANEL
ALT: 16 U/L (ref 0–44)
AST: 20 U/L (ref 15–41)
Albumin: 2.6 g/dL — ABNORMAL LOW (ref 3.5–5.0)
Alkaline Phosphatase: 93 U/L (ref 38–126)
Anion gap: 8 (ref 5–15)
BUN: <5 mg/dL — ABNORMAL LOW (ref 6–20)
CO2: 19 mmol/L — ABNORMAL LOW (ref 22–32)
Calcium: 9.5 mg/dL (ref 8.9–10.3)
Chloride: 108 mmol/L (ref 98–111)
Creatinine, Ser: 0.72 mg/dL (ref 0.44–1.00)
GFR calc Af Amer: >60 mL/min (ref >60)
GFR calc non Af Amer: >60 mL/min (ref >60)
Glucose, Bld: 101 mg/dL — ABNORMAL HIGH (ref 70–99)
Potassium: 3.8 mmol/L (ref 3.5–5.1)
Sodium: 135 mmol/L (ref 135–145)
Total Bilirubin: 0.5 mg/dL (ref 0.3–1.2)
Total Protein: 6 g/dL — ABNORMAL LOW (ref 6.5–8.1)

## 2019-05-02 MED ORDER — BUTALBITAL-APAP-CAFFEINE 50-325-40 MG PO TABS
2.0000 | ORAL_TABLET | Freq: Once | ORAL | Status: AC
  Administered 2019-05-02: 2 via ORAL
  Filled 2019-05-02: qty 2

## 2019-05-02 NOTE — Discharge Instructions (Signed)

## 2019-05-02 NOTE — MAU Provider Note (Signed)
History     CSN: [id number]  Arrival date and time: 05/02/19 1327   First Provider Initiated Contact with Patient 05/02/19 1420      Chief Complaint  Patient presents with  . Contractions  . Hypertension   Alexis Richardson is a 18 y.o. G1P0 at 66w3dwho presents for Contractions, vaginal discharge, headache, and elevated BP.  She reports the contractions started last night and is unsure of how often they are occurring.  She reports the contractions causes "my stomach to get tight" and patient also reports constant back pain.  She rates the back paina 5/10. She reports her blood pressures at home were 140s/90s since last night and she also has a HA that started yesterday.  She reports taking Tylenol XR around 2pm with no relief in symptoms.  She reports she drinks a gallon of water daily.  Patient also reports vaginal discharge that she states has been present since her mucous plug fell out and is "thick, stringy, and like mucous." Patient endorses fetal movement and denies LOF or vaginal bleeding. Patient expresses desire for IOL.     OB History    Gravida  1   Para  0   Term  0   Preterm  0   AB  0   Living  0     SAB  0   TAB  0   Ectopic  0   Multiple  0   Live Births  0           Past Medical History:  Diagnosis Date  . Gestational diabetes   . [place]    a. Dx [place], had IUD removed in case this contributed.  . Right ventricular dilation    a. at time of PE 09/2017.    Past Surgical History:  Procedure Laterality Date  . KELOID EXCISION      Family History  Problem Relation Age of Onset  . Hypertension Mother   . Hypertension Maternal Aunt   . Cancer Maternal Grandfather   . Pulmonary embolism Neg Hx   . Heart disease Neg Hx     Social History   Tobacco Use  . Smoking status: Never Smoker  . Smokeless tobacco: Never Used  Substance Use Topics  . Alcohol use: No  . Drug use: Not Currently    Types: Marijuana   Comment: LAST TIME SMOKED - BEFORE PREG    Allergies: No Known Allergies  Medications Prior to Admission  Medication Sig Dispense Refill Last Dose  . Accu-Chek FastClix Lancets MISC 1 each by Percutaneous route 4 (four) times daily. 100 each 12 05/01/2019 at Unknown time  . Blood Glucose Monitoring Suppl (ACCU-CHEK GUIDE) w/Device KIT 1 Device by Does not apply route 4 (four) times daily. 1 kit 0 05/01/2019 at Unknown time  . enoxaparin (LOVENOX) 40 MG/0.4ML injection Inject 0.4 mLs (40 mg total) into the skin daily. 90 Syringe 4 05/01/2019 at Unknown time  . glucose blood (ACCU-CHEK GUIDE) test strip Dispense 2 boxes of 50. Use four times per day 50 each 12 05/01/2019 at Unknown time  . Prenatal Multivit-Min-Fe-FA (PRENATAL VITAMINS) 0.8 MG tablet Take 1 tablet by mouth daily. 30 tablet 12 05/01/2019 at Unknown time    Review of Systems  Constitutional: Negative for chills and fever.  Eyes: Negative for visual disturbance.  Respiratory: Negative for cough and shortness of breath.   Gastrointestinal: Negative for abdominal pain, diarrhea and vomiting.  Genitourinary: Positive for vaginal discharge. Negative for  dysuria and vaginal bleeding.  Musculoskeletal: Positive for back pain.  Neurological: Positive for headaches. Negative for dizziness and light-headedness.   Physical Exam   Blood pressure (!) 136/95, pulse (!) 118, temperature 98.3 F (36.8 C), temperature source Oral, resp. rate 16, height '5\' 3"'  (1.6 m), weight 108.4 kg, last menstrual period 08/13/2018, SpO2 100 %.  Vitals:   05/02/19 1352 05/02/19 1353 05/02/19 1400 05/02/19 1405  BP: (!) 143/87  (!) 136/95   Pulse: (!) 105  (!) 118   Resp:      Temp:      TempSrc:      SpO2:   100% 100%  Weight:  108.4 kg    Height:  '5\' 3"'  (1.6 m)       Physical Exam  Constitutional: She is oriented to person, place, and time. She appears well-developed and well-nourished.  HENT:  Head: Normocephalic and atraumatic.  Eyes:  Conjunctivae are normal.  Neck: Normal range of motion.  Cardiovascular: Normal rate, regular rhythm and normal heart sounds.  Respiratory: Effort normal and breath sounds normal.  GI: Soft. There is no abdominal tenderness.  Musculoskeletal: Normal range of motion.        General: Edema present.  Neurological: She is alert and oriented to person, place, and time.  Skin: Skin is warm and dry.  Psychiatric: She has a normal mood and affect. Her behavior is normal.    Fetal Assessment 150 bpm, Mod Var, -Decels, +Accels Toco: Occasional  MAU Course   Results for orders placed or performed during the hospital encounter of 05/02/19 (from the past 24 hour(s))  Urinalysis, Routine w reflex microscopic     Status: Abnormal   Collection Time: 05/02/19  2:13 PM  Result Value Ref Range   Color, Urine YELLOW YELLOW   APPearance CLEAR CLEAR   Specific Gravity, Urine 1.009 1.005 - 1.030   pH 6.0 5.0 - 8.0   Glucose, UA NEGATIVE NEGATIVE mg/dL   Hgb urine dipstick NEGATIVE NEGATIVE   Bilirubin Urine NEGATIVE NEGATIVE   Ketones, ur NEGATIVE NEGATIVE mg/dL   Protein, ur NEGATIVE NEGATIVE mg/dL   Nitrite NEGATIVE NEGATIVE   Leukocytes,Ua SMALL (A) NEGATIVE   RBC / HPF 0-5 0 - 5 RBC/hpf   WBC, UA 0-5 0 - 5 WBC/hpf   Bacteria, UA RARE (A) NONE SEEN   Squamous Epithelial / LPF 0-5 0 - 5   Mucus PRESENT   Comprehensive metabolic panel     Status: Abnormal   Collection Time: 05/02/19  2:36 PM  Result Value Ref Range   Sodium 135 135 - 145 mmol/L   Potassium 3.8 3.5 - 5.1 mmol/L   Chloride 108 98 - 111 mmol/L   CO2 19 (L) 22 - 32 mmol/L   Glucose, Bld 101 (H) 70 - 99 mg/dL   BUN <5 (L) 6 - 20 mg/dL   Creatinine, Ser 0.72 0.44 - 1.00 mg/dL   Calcium 9.5 8.9 - 10.3 mg/dL   Total Protein 6.0 (L) 6.5 - 8.1 g/dL   Albumin 2.6 (L) 3.5 - 5.0 g/dL   AST 20 15 - 41 U/L   ALT 16 0 - 44 U/L   Alkaline Phosphatase 93 38 - 126 U/L   Total Bilirubin 0.5 0.3 - 1.2 mg/dL   GFR calc non Af Amer >60 >60  mL/min   GFR calc Af Amer >60 >60 mL/min   Anion gap 8 5 - 15  CBC     Status: Abnormal   Collection Time:  05/02/19  2:36 PM  Result Value Ref Range   WBC 8.4 4.0 - 10.5 K/uL   RBC 4.00 3.87 - 5.11 MIL/uL   Hemoglobin 11.2 (L) 12.0 - 15.0 g/dL   HCT 33.4 (L) 36.0 - 46.0 %   MCV 83.5 80.0 - 100.0 fL   MCH 28.0 26.0 - 34.0 pg   MCHC 33.5 30.0 - 36.0 g/dL   RDW 14.0 11.5 - 15.5 %   Platelets 147 (L) 150 - 400 K/uL   nRBC 0.0 0.0 - 0.2 %   No results found.  MDM PE Labs:UA, PIH EFM  Assessment and Plan  18 year old G1P0 SIUP at 37.3weeks Cat I FT Contractions Elevated BP  -Exam findings discussed. -Informed that blood pressures are elevated, but normotensive.  -Informed that we will obtain PreE labs and await for results. -Offered and accept pain medication for HA. -Fiorcet 2 tablets ordered.  -Will await results.   Follow Up (3:40 PM)  -Patient reports improvement in headache from 5 to 2/10. -Informed that labs are pending. -Patient questions if she will be admitted, informed that unlikely as no severe bps, headache relieved with fiorcet, and not in active labor. -Patient requests discharge. -Addressed patient questions regarding methods to induce labor at home; suggested ambulation and sex. -Discussed how methods are not guaranteed and patient may require induction. -Patient without further questions or concerns. -Encouraged to continue to monitor blood pressures at home. -Keep scheduled appt for Friday May 29th. -Encouraged to call or return to MAU if symptoms worsen or with the onset of new symptoms. -Discharged to home in stable condition.  Maryann Conners MSN, CNM 05/02/2019, 2:21 PM

## 2019-05-02 NOTE — MAU Note (Addendum)
Contractions  . Mucus discharge, Baby moving well. Blood pressure was elevated - taking at home, 143/96. Having headache since yesterday, took Tylenol - didn't help. No bleeding.

## 2019-05-03 ENCOUNTER — Encounter (HOSPITAL_COMMUNITY): Payer: Self-pay

## 2019-05-03 ENCOUNTER — Telehealth: Payer: Self-pay | Admitting: Obstetrics and Gynecology

## 2019-05-03 ENCOUNTER — Other Ambulatory Visit: Payer: Self-pay

## 2019-05-03 ENCOUNTER — Inpatient Hospital Stay (HOSPITAL_COMMUNITY)
Admission: AD | Admit: 2019-05-03 | Discharge: 2019-05-06 | DRG: 807 | Disposition: A | Discharge status: Home / Self Care | Payer: BLUE CROSS/BLUE SHIELD | Attending: Obstetrics & Gynecology | Admitting: Obstetrics & Gynecology

## 2019-05-03 ENCOUNTER — Encounter: Payer: Self-pay | Admitting: Obstetrics and Gynecology

## 2019-05-03 DIAGNOSIS — Z1159 Encounter for screening for other viral diseases: Secondary | ICD-10-CM | POA: Diagnosis not present

## 2019-05-03 DIAGNOSIS — O134 Gestational [pregnancy-induced] hypertension without significant proteinuria, complicating childbirth: Secondary | ICD-10-CM | POA: Diagnosis present

## 2019-05-03 DIAGNOSIS — Z86711 Personal history of pulmonary embolism: Secondary | ICD-10-CM | POA: Diagnosis not present

## 2019-05-03 DIAGNOSIS — D696 Thrombocytopenia, unspecified: Secondary | ICD-10-CM | POA: Insufficient documentation

## 2019-05-03 DIAGNOSIS — O133 Gestational [pregnancy-induced] hypertension without significant proteinuria, third trimester: Secondary | ICD-10-CM | POA: Diagnosis not present

## 2019-05-03 DIAGNOSIS — O99214 Obesity complicating childbirth: Secondary | ICD-10-CM | POA: Diagnosis present

## 2019-05-03 DIAGNOSIS — O139 Gestational [pregnancy-induced] hypertension without significant proteinuria, unspecified trimester: Secondary | ICD-10-CM | POA: Diagnosis present

## 2019-05-03 DIAGNOSIS — O99824 Streptococcus B carrier state complicating childbirth: Secondary | ICD-10-CM | POA: Diagnosis present

## 2019-05-03 DIAGNOSIS — Z6791 Unspecified blood type, Rh negative: Secondary | ICD-10-CM | POA: Diagnosis not present

## 2019-05-03 DIAGNOSIS — O99210 Obesity complicating pregnancy, unspecified trimester: Secondary | ICD-10-CM

## 2019-05-03 DIAGNOSIS — Z3A37 37 weeks gestation of pregnancy: Secondary | ICD-10-CM | POA: Diagnosis not present

## 2019-05-03 DIAGNOSIS — O24420 Gestational diabetes mellitus in childbirth, diet controlled: Secondary | ICD-10-CM | POA: Diagnosis present

## 2019-05-03 DIAGNOSIS — O24419 Gestational diabetes mellitus in pregnancy, unspecified control: Secondary | ICD-10-CM | POA: Diagnosis present

## 2019-05-03 DIAGNOSIS — O99119 Other diseases of the blood and blood-forming organs and certain disorders involving the immune mechanism complicating pregnancy, unspecified trimester: Secondary | ICD-10-CM | POA: Insufficient documentation

## 2019-05-03 DIAGNOSIS — O26893 Other specified pregnancy related conditions, third trimester: Secondary | ICD-10-CM | POA: Diagnosis present

## 2019-05-03 DIAGNOSIS — O26899 Other specified pregnancy related conditions, unspecified trimester: Secondary | ICD-10-CM

## 2019-05-03 DIAGNOSIS — O99820 Streptococcus B carrier state complicating pregnancy: Secondary | ICD-10-CM

## 2019-05-03 HISTORY — DX: Gestational (pregnancy-induced) hypertension without significant proteinuria, unspecified trimester: O13.9

## 2019-05-03 LAB — CBC
HCT: 33.1 % — ABNORMAL LOW (ref 36.0–46.0)
Hemoglobin: 11.2 g/dL — ABNORMAL LOW (ref 12.0–15.0)
MCH: 28.1 pg (ref 26.0–34.0)
MCHC: 33.8 g/dL (ref 30.0–36.0)
MCV: 83.2 fL (ref 80.0–100.0)
Platelets: 152 10*3/uL (ref 150–400)
RBC: 3.98 MIL/uL (ref 3.87–5.11)
RDW: 13.9 % (ref 11.5–15.5)
WBC: 8.2 10*3/uL (ref 4.0–10.5)
nRBC: 0 % (ref 0.0–0.2)

## 2019-05-03 LAB — GLUCOSE, CAPILLARY
Glucose-Capillary: 54 mg/dL — ABNORMAL LOW (ref 70–99)
Glucose-Capillary: 60 mg/dL — ABNORMAL LOW (ref 70–99)
Glucose-Capillary: 63 mg/dL — ABNORMAL LOW (ref 70–99)
Glucose-Capillary: 64 mg/dL — ABNORMAL LOW (ref 70–99)
Glucose-Capillary: 95 mg/dL (ref 70–99)

## 2019-05-03 LAB — SARS CORONAVIRUS 2 BY RT PCR (HOSPITAL ORDER, PERFORMED IN ~~LOC~~ HOSPITAL LAB): SARS Coronavirus 2: NEGATIVE

## 2019-05-03 LAB — TYPE AND SCREEN
ABO/RH(D): A NEG
Antibody Screen: NEGATIVE

## 2019-05-03 LAB — ABO/RH: ABO/RH(D): A NEG

## 2019-05-03 MED ORDER — TERBUTALINE SULFATE 1 MG/ML IJ SOLN: 0.2500 mg | Freq: Once | INTRAMUSCULAR | Status: DC | PRN

## 2019-05-03 MED ORDER — ZOLPIDEM TARTRATE 5 MG PO TABS
5.0000 mg | ORAL_TABLET | Freq: Every evening | ORAL | Status: DC | PRN
  Administered 2019-05-04: 5 mg via ORAL
  Filled 2019-05-03: qty 1

## 2019-05-03 MED ORDER — LIDOCAINE HCL (PF) 1 % IJ SOLN: 30.0000 mL | INTRAMUSCULAR | Status: DC | PRN

## 2019-05-03 MED ORDER — OXYCODONE-ACETAMINOPHEN 5-325 MG PO TABS: 2.0000 | ORAL_TABLET | ORAL | Status: DC | PRN

## 2019-05-03 MED ORDER — OXYCODONE-ACETAMINOPHEN 5-325 MG PO TABS: 1.0000 | ORAL_TABLET | ORAL | Status: DC | PRN

## 2019-05-03 MED ORDER — ACETAMINOPHEN 325 MG PO TABS: 650.0000 mg | ORAL_TABLET | ORAL | Status: DC | PRN

## 2019-05-03 MED ORDER — LACTATED RINGERS IV SOLN
500.0000 mL | INTRAVENOUS | Status: DC | PRN
  Administered 2019-05-04: 250 mL via INTRAVENOUS

## 2019-05-03 MED ORDER — OXYTOCIN 40 UNITS IN NORMAL SALINE INFUSION - SIMPLE MED
2.5000 [IU]/h | INTRAVENOUS | Status: DC
  Administered 2019-05-04: 2.5 [IU]/h via INTRAVENOUS

## 2019-05-03 MED ORDER — PENICILLIN G 3 MILLION UNITS IVPB - SIMPLE MED
3.0000 10*6.[IU] | INTRAVENOUS | Status: DC
  Administered 2019-05-04 (×3): 3 10*6.[IU] via INTRAVENOUS
  Filled 2019-05-03 (×3): qty 100

## 2019-05-03 MED ORDER — MISOPROSTOL 25 MCG QUARTER TABLET
25.0000 ug | ORAL_TABLET | ORAL | Status: DC | PRN
  Filled 2019-05-03: qty 1

## 2019-05-03 MED ORDER — SOD CITRATE-CITRIC ACID 500-334 MG/5ML PO SOLN: 30.0000 mL | ORAL | Status: DC | PRN

## 2019-05-03 MED ORDER — OXYTOCIN BOLUS FROM INFUSION
500.0000 mL | Freq: Once | INTRAVENOUS | Status: AC
  Administered 2019-05-04: 500 mL via INTRAVENOUS

## 2019-05-03 MED ORDER — FLEET ENEMA 7-19 GM/118ML RE ENEM: 1.0000 | ENEMA | RECTAL | Status: DC | PRN

## 2019-05-03 MED ORDER — LACTATED RINGERS IV SOLN
INTRAVENOUS | Status: DC
  Administered 2019-05-03 – 2019-05-04 (×2): via INTRAVENOUS

## 2019-05-03 MED ORDER — ONDANSETRON HCL 4 MG/2ML IJ SOLN: 4.0000 mg | Freq: Four times a day (QID) | INTRAMUSCULAR | Status: DC | PRN

## 2019-05-03 MED ORDER — OXYTOCIN 40 UNITS IN NORMAL SALINE INFUSION - SIMPLE MED
1.0000 m[IU]/min | INTRAVENOUS | Status: DC
  Administered 2019-05-03: 1 m[IU]/min via INTRAVENOUS
  Administered 2019-05-04: 5 m[IU]/min via INTRAVENOUS
  Filled 2019-05-03: qty 1000

## 2019-05-03 MED ORDER — SODIUM CHLORIDE 0.9 % IV SOLN
5.0000 10*6.[IU] | Freq: Once | INTRAVENOUS | Status: AC
  Administered 2019-05-03: 5 10*6.[IU] via INTRAVENOUS
  Filled 2019-05-03: qty 5

## 2019-05-03 MED ORDER — HYDROXYZINE HCL 50 MG PO TABS: 50.0000 mg | ORAL_TABLET | Freq: Four times a day (QID) | ORAL | Status: DC | PRN

## 2019-05-03 MED ORDER — FENTANYL CITRATE (PF) 100 MCG/2ML IJ SOLN
50.0000 ug | INTRAMUSCULAR | Status: DC | PRN
  Administered 2019-05-04: 100 ug via INTRAVENOUS
  Filled 2019-05-03: qty 2

## 2019-05-03 NOTE — Progress Notes (Signed)
139/88  149/98 Foley fell out.  No ctx, FHR Cat 1.  BS in the 60's, now in the 90's after eating. Pt was asymptomatic.  Will start pitocin

## 2019-05-03 NOTE — Telephone Encounter (Signed)
OB Telephone Note  I was reviewing her visit from yesterday, and she has gHTN and is past 37wks. Given this, recommendation is for delivery. I called her at [phone] and it went straight to a generic VM. VM left asking patient to call the clinic.  If patient calls, please tell her to hold her lovenox and will need to set up asap induction of labor.   I will try again later  Cornelia Copa MD Attending Center for [facility] (Faculty Practice) 05/03/2019 Time: 1245pm

## 2019-05-03 NOTE — H&P (Signed)
Alexis Richardson is a 18 y.o. female G1P0000 with IUP at 30w4dpresenting for IOL for GHTN. [facility] [facility] Prenatal History/Complications:  Newly dx GHTN Hx unprovoked PE, on lovenox (last dose yesterday)   Past Medical History: Past Medical History:  Diagnosis Date  . Gestational diabetes   . [place]    a. Dx 09/2017, had IUD removed in case this contributed.  . Right ventricular dilation    a. at time of PE 09/2017.    Past Surgical History: Past Surgical History:  Procedure Laterality Date  . KELOID EXCISION      Obstetrical History: OB History    Gravida  1   Para  0   Term  0   Preterm  0   AB  0   Living  0     SAB  0   TAB  0   Ectopic  0   Multiple  0   Live Births  0           Social History: Social History   Socioeconomic History  . Marital status: Single    Spouse name: Not on file  . Number of children: Not on file  . Years of education: Not on file  . Highest education level: Not on file  Occupational History  . Not on file  Social Needs  . Financial resource strain: Not on file  . Food insecurity:    Worry: Never true    Inability: Never true  . Transportation needs:    Medical: No    Non-medical: No  Tobacco Use  . Smoking status: Never Smoker  . Smokeless tobacco: Never Used  Substance and Sexual Activity  . Alcohol use: No  . Drug use: Not Currently    Types: Marijuana    Comment: LAST TIME SMOKED - BEFORE PREG  . Sexual activity: Yes    Birth control/protection: None  Lifestyle  . Physical activity:    Days per week: Not on file    Minutes per session: Not on file  . Stress: Not on file  Relationships  . Social connections:    Talks on phone: Not on file    Gets together: Not on file    Attends religious service: Not on file    Active member of club or organization: Not on file    Attends meetings of clubs or organizations: Not on file    Relationship status: Not on file  Other Topics  Concern  . Not on file  Social History Narrative  . Not on file    Family History: Family History  Problem Relation Age of Onset  . Hypertension Mother   . Hypertension Maternal Aunt   . Cancer Maternal Grandfather   . Pulmonary embolism Neg Hx   . Heart disease Neg Hx     Allergies: No Known Allergies  Medications Prior to Admission  Medication Sig Dispense Refill Last Dose  . enoxaparin (LOVENOX) 40 MG/0.4ML injection Inject 0.4 mLs (40 mg total) into the skin daily. 90 Syringe 4 05/03/2019 at Unknown time  . Prenatal Multivit-Min-Fe-FA (PRENATAL VITAMINS) 0.8 MG tablet Take 1 tablet by mouth daily. 30 tablet 12 05/02/2019 at Unknown time  . Accu-Chek FastClix Lancets MISC 1 each by Percutaneous route 4 (four) times daily. 100 each 12 05/01/2019 at Unknown time  . Blood Glucose Monitoring Suppl (ACCU-CHEK GUIDE) w/Device KIT 1 Device by Does not apply route 4 (four) times daily. 1 kit 0 05/01/2019 at Unknown  time  . glucose blood (ACCU-CHEK GUIDE) test strip Dispense 2 boxes of 50. Use four times per day 50 each 12 05/01/2019 at Unknown time        Review of Systems   Constitutional: Negative for fever and chills Eyes: Negative for visual disturbances Respiratory: Negative for shortness of breath, dyspnea Cardiovascular: Negative for chest pain or palpitations  Gastrointestinal: Negative for abdominal pain, vomiting, diarrhea and constipation.   Genitourinary: Negative for dysuria and urgency Musculoskeletal: Negative for back pain, joint pain, myalgias  Neurological: Negative for dizziness and headaches      Last menstrual period 08/13/2018. General appearance: alert, cooperative and no distress Lungs: clear to auscultation bilaterally Heart: regular rate and rhythm Abdomen: soft, non-tender; bowel sounds normal Extremities: Homans sign is negative, no sign of DVT DTR's 2+ Presentation: cephalic Fetal monitoring  Baseline: 140 bpm, Variability: Good {> 6 bpm),  Accelerations: Reactive and Decelerations: Absent Uterine activity  None  Cx 1/50/-2   Prenatal labs: ABO, Rh: --/--/A NEG, A NEG Performed at [facility] [address], [place], [place] [address]  [phone]/28 1952) Antibody: NEG (05/28 1952) Rubella: immune RPR: Non Reactive (03/25 1003)  HBsAg: Negative (11/04 1111)  HIV: Non Reactive (03/25 1003)    Prenatal Transfer Tool  Maternal Diabetes: Yes:  Diabetes Type:  Diet controlled Genetic Screening: Declined Maternal Ultrasounds/Referrals: Normal Fetal Ultrasounds or other Referrals:  None Maternal Substance Abuse:  No Significant Maternal Medications:  None Significant Maternal Lab Results: Lab values include: Group B Strep positive    Results for orders placed or performed during the hospital encounter of 05/03/19 (from the past 24 hour(s))  CBC   Collection Time: 05/03/19  7:52 PM  Result Value Ref Range   WBC 8.2 4.0 - 10.5 K/uL   RBC 3.98 3.87 - 5.11 MIL/uL   Hemoglobin 11.2 (L) 12.0 - 15.0 g/dL   HCT 33.1 (L) 36.0 - 46.0 %   MCV 83.2 80.0 - 100.0 fL   MCH 28.1 26.0 - 34.0 pg   MCHC 33.8 30.0 - 36.0 g/dL   RDW 13.9 11.5 - 15.5 %   Platelets 152 150 - 400 K/uL   nRBC 0.0 0.0 - 0.2 %  Type and screen [place]   Collection Time: 05/03/19  7:52 PM  Result Value Ref Range   ABO/RH(D) A NEG    Antibody Screen NEG    Sample Expiration      05/06/2019,2359 Performed at [facility], [place] [address] [place], [place] [address]   ABO/Rh   Collection Time: 05/03/19  7:52 PM  Result Value Ref Range   ABO/RH(D)      A NEG Performed at [facility] [address] [place],  [address]   Glucose, capillary   Collection Time: 05/03/19  8:44 PM  Result Value Ref Range   Glucose-Capillary 60 (L) 70 - 99 mg/dL  Glucose, capillary   Collection Time: 05/03/19  9:04 PM  Result Value Ref Range   Glucose-Capillary 63 (L) 70 - 99 mg/dL    Assessment: Alexis Richardson is a 18 y.o.  G1P0000 with an IUP at 1w4dpresenting for IOL for GHTN.  Plan: #Labor: Foley placed and inflated w/ 60cc H20->pitocin #Pain:  Per request #FWB Cat 1 #GDM:  BS q 4 hours>q 2 when actively laboring.   FChristin Fudge5/28/2020, 9:13 PM

## 2019-05-04 ENCOUNTER — Telehealth: Payer: Medicaid Other | Admitting: Obstetrics & Gynecology

## 2019-05-04 ENCOUNTER — Encounter (HOSPITAL_COMMUNITY): Payer: Self-pay | Admitting: *Deleted

## 2019-05-04 ENCOUNTER — Inpatient Hospital Stay (HOSPITAL_COMMUNITY): Payer: BLUE CROSS/BLUE SHIELD | Admitting: Anesthesiology

## 2019-05-04 DIAGNOSIS — Z3A37 37 weeks gestation of pregnancy: Secondary | ICD-10-CM

## 2019-05-04 DIAGNOSIS — O134 Gestational [pregnancy-induced] hypertension without significant proteinuria, complicating childbirth: Secondary | ICD-10-CM

## 2019-05-04 DIAGNOSIS — O24420 Gestational diabetes mellitus in childbirth, diet controlled: Secondary | ICD-10-CM

## 2019-05-04 LAB — RPR: RPR Ser Ql: NONREACTIVE

## 2019-05-04 LAB — CBC
HCT: 36 % (ref 36.0–46.0)
HCT: 37.2 % (ref 36.0–46.0)
Hemoglobin: 11.8 g/dL — ABNORMAL LOW (ref 12.0–15.0)
Hemoglobin: 12.5 g/dL (ref 12.0–15.0)
MCH: 27.6 pg (ref 26.0–34.0)
MCH: 28.2 pg (ref 26.0–34.0)
MCHC: 32.8 g/dL (ref 30.0–36.0)
MCHC: 33.6 g/dL (ref 30.0–36.0)
MCV: 84.1 fL (ref 80.0–100.0)
MCV: 83.8 fL (ref 80.0–100.0)
Platelets: 148 10*3/uL — ABNORMAL LOW (ref 150–400)
Platelets: 164 10*3/uL (ref 150–400)
RBC: 4.28 MIL/uL (ref 3.87–5.11)
RBC: 4.44 MIL/uL (ref 3.87–5.11)
RDW: 14.1 % (ref 11.5–15.5)
RDW: 14 % (ref 11.5–15.5)
WBC: 13 10*3/uL — ABNORMAL HIGH (ref 4.0–10.5)
WBC: 14 10*3/uL — ABNORMAL HIGH (ref 4.0–10.5)
nRBC: 0 % (ref 0.0–0.2)
nRBC: 0 % (ref 0.0–0.2)

## 2019-05-04 LAB — GLUCOSE, CAPILLARY
Glucose-Capillary: 106 mg/dL — ABNORMAL HIGH (ref 70–99)
Glucose-Capillary: 81 mg/dL (ref 70–99)
Glucose-Capillary: 79 mg/dL (ref 70–99)

## 2019-05-04 MED ORDER — ACETAMINOPHEN 325 MG PO TABS: 650.0000 mg | ORAL_TABLET | ORAL | Status: DC | PRN

## 2019-05-04 MED ORDER — ZOLPIDEM TARTRATE 5 MG PO TABS: 5.0000 mg | ORAL_TABLET | Freq: Every evening | ORAL | Status: DC | PRN

## 2019-05-04 MED ORDER — BENZOCAINE-MENTHOL 20-0.5 % EX AERO
1.0000 "application " | INHALATION_SPRAY | CUTANEOUS | Status: DC | PRN
  Administered 2019-05-04: 1 via TOPICAL
  Filled 2019-05-04: qty 56

## 2019-05-04 MED ORDER — PHENYLEPHRINE 40 MCG/ML (10ML) SYRINGE FOR IV PUSH (FOR BLOOD PRESSURE SUPPORT): 80.0000 ug | PREFILLED_SYRINGE | INTRAVENOUS | Status: DC | PRN

## 2019-05-04 MED ORDER — DIPHENHYDRAMINE HCL 25 MG PO CAPS: 25.0000 mg | ORAL_CAPSULE | Freq: Four times a day (QID) | ORAL | Status: DC | PRN

## 2019-05-04 MED ORDER — SENNOSIDES-DOCUSATE SODIUM 8.6-50 MG PO TABS
2.0000 | ORAL_TABLET | ORAL | Status: DC
  Administered 2019-05-05 – 2019-05-06 (×2): 2 via ORAL
  Filled 2019-05-04 (×2): qty 2

## 2019-05-04 MED ORDER — WITCH HAZEL-GLYCERIN EX PADS
1.0000 "application " | MEDICATED_PAD | CUTANEOUS | Status: DC | PRN
  Administered 2019-05-04: 1 via TOPICAL

## 2019-05-04 MED ORDER — LACTATED RINGERS IV SOLN: 500.0000 mL | Freq: Once | INTRAVENOUS | Status: DC

## 2019-05-04 MED ORDER — PHENYLEPHRINE 40 MCG/ML (10ML) SYRINGE FOR IV PUSH (FOR BLOOD PRESSURE SUPPORT)
80.0000 ug | PREFILLED_SYRINGE | INTRAVENOUS | Status: DC | PRN
  Filled 2019-05-04: qty 10

## 2019-05-04 MED ORDER — IBUPROFEN 600 MG PO TABS
600.0000 mg | ORAL_TABLET | Freq: Four times a day (QID) | ORAL | Status: DC
  Administered 2019-05-04 – 2019-05-06 (×8): 600 mg via ORAL
  Filled 2019-05-04 (×9): qty 1

## 2019-05-04 MED ORDER — COCONUT OIL OIL: 1.0000 "application " | TOPICAL_OIL | Status: DC | PRN

## 2019-05-04 MED ORDER — TETANUS-DIPHTH-ACELL PERTUSSIS 5-2.5-18.5 LF-MCG/0.5 IM SUSP: 0.5000 mL | Freq: Once | INTRAMUSCULAR | Status: DC

## 2019-05-04 MED ORDER — ENOXAPARIN SODIUM 40 MG/0.4ML ~~LOC~~ SOLN
40.0000 mg | SUBCUTANEOUS | Status: DC
  Administered 2019-05-05 – 2019-05-06 (×2): 40 mg via SUBCUTANEOUS
  Filled 2019-05-04 (×2): qty 0.4

## 2019-05-04 MED ORDER — SODIUM CHLORIDE (PF) 0.9 % IJ SOLN
INTRAMUSCULAR | Status: DC | PRN
  Administered 2019-05-04: 12 mL/h via EPIDURAL

## 2019-05-04 MED ORDER — EPHEDRINE 5 MG/ML INJ: 10.0000 mg | INTRAVENOUS | Status: DC | PRN

## 2019-05-04 MED ORDER — SIMETHICONE 80 MG PO CHEW: 80.0000 mg | CHEWABLE_TABLET | ORAL | Status: DC | PRN

## 2019-05-04 MED ORDER — ONDANSETRON HCL 4 MG PO TABS: 4.0000 mg | ORAL_TABLET | ORAL | Status: DC | PRN

## 2019-05-04 MED ORDER — LIDOCAINE-EPINEPHRINE (PF) 2 %-1:200000 IJ SOLN
INTRAMUSCULAR | Status: DC | PRN
  Administered 2019-05-04 (×2): 3 mL via EPIDURAL

## 2019-05-04 MED ORDER — DIBUCAINE (PERIANAL) 1 % EX OINT
1.0000 "application " | TOPICAL_OINTMENT | CUTANEOUS | Status: DC | PRN
  Administered 2019-05-04: 1 via RECTAL
  Filled 2019-05-04: qty 28

## 2019-05-04 MED ORDER — FENTANYL-BUPIVACAINE-NACL 0.5-0.125-0.9 MG/250ML-% EP SOLN
12.0000 mL/h | EPIDURAL | Status: DC | PRN
  Filled 2019-05-04: qty 250

## 2019-05-04 MED ORDER — ONDANSETRON HCL 4 MG/2ML IJ SOLN: 4.0000 mg | INTRAMUSCULAR | Status: DC | PRN

## 2019-05-04 MED ORDER — PRENATAL MULTIVITAMIN CH
1.0000 | ORAL_TABLET | Freq: Every day | ORAL | Status: DC
  Administered 2019-05-05 – 2019-05-06 (×2): 1 via ORAL
  Filled 2019-05-04 (×2): qty 1

## 2019-05-04 MED ORDER — DIPHENHYDRAMINE HCL 50 MG/ML IJ SOLN: 12.5000 mg | INTRAMUSCULAR | Status: DC | PRN

## 2019-05-04 NOTE — Anesthesia Procedure Notes (Signed)
Epidural Patient location during procedure: OB Start time: 05/04/2019 10:45 AM End time: 05/04/2019 11:00 AM  Staffing Anesthesiologist: Elmer Picker, MD Performed: anesthesiologist   Preanesthetic Checklist Completed: patient identified, pre-op evaluation, timeout performed, IV checked, risks and benefits discussed and monitors and equipment checked  Epidural Patient position: sitting Prep: site prepped and draped and DuraPrep Patient monitoring: continuous pulse ox, blood pressure, heart rate and cardiac monitor Approach: midline Location: L3-L4 Injection technique: LOR air  Needle:  Needle type: Tuohy  Needle gauge: 17 G Needle length: 9 cm Needle insertion depth: 7 cm Catheter type: closed end flexible Catheter size: 19 Gauge Catheter at skin depth: 13 cm Test dose: negative  Assessment Sensory level: T8 Events: blood not aspirated, injection not painful, no injection resistance, negative IV test and no paresthesia  Additional Notes Patient identified. Risks/Benefits/Options discussed with patient including but not limited to bleeding, infection, nerve damage, paralysis, failed block, incomplete pain control, headache, blood pressure changes, nausea, vomiting, reactions to medication both or allergic, itching and postpartum back pain. Confirmed with bedside nurse the patient's most recent platelet count. Confirmed with patient that they are not currently taking any anticoagulation, have any bleeding history or any family history of bleeding disorders. Patient expressed understanding and wished to proceed. All questions were answered. Sterile technique was used throughout the entire procedure. Please see nursing notes for vital signs. Test dose was given through epidural catheter and negative prior to continuing to dose epidural or start infusion. Warning signs of high block given to the patient including shortness of breath, tingling/numbness in hands, complete motor block,  or any concerning symptoms with instructions to call for help. Patient was given instructions on fall risk and not to get out of bed. All questions and concerns addressed with instructions to call with any issues or inadequate analgesia.  Reason for block:procedure for pain

## 2019-05-04 NOTE — Anesthesia Preprocedure Evaluation (Signed)
Anesthesia Evaluation  Patient identified by MRN, date of birth, ID band Patient awake    Reviewed: Allergy & Precautions, NPO status , Patient's Chart, lab work & pertinent test results  Airway Mallampati: III  TM Distance: >3 FB Neck ROM: Full    Dental no notable dental hx.    Pulmonary PE (2018 in setting of IUD. On lovenox 40mg  QD. Last dose 05/03/19 at 1pm)   Pulmonary exam normal breath sounds clear to auscultation       Cardiovascular hypertension, Normal cardiovascular exam Rhythm:Regular Rate:Normal     Neuro/Psych negative neurological ROS  negative psych ROS   GI/Hepatic negative GI ROS, Neg liver ROS,   Endo/Other  diabetes, GestationalMorbid obesity  Renal/GU negative Renal ROS  negative genitourinary   Musculoskeletal negative musculoskeletal ROS (+)   Abdominal   Peds negative pediatric ROS (+)  Hematology negative hematology ROS (+)   Anesthesia Other Findings   Reproductive/Obstetrics (+) Pregnancy                             Anesthesia Physical Anesthesia Plan  ASA: III  Anesthesia Plan: Epidural   Post-op Pain Management:    Induction:   PONV Risk Score and Plan: Treatment may vary due to age or medical condition  Airway Management Planned: Natural Airway  Additional Equipment:   Intra-op Plan:   Post-operative Plan:   Informed Consent: I have reviewed the patients History and Physical, chart, labs and discussed the procedure including the risks, benefits and alternatives for the proposed anesthesia with the patient or authorized representative who has indicated his/her understanding and acceptance.       Plan Discussed with: Anesthesiologist  Anesthesia Plan Comments: (Patient identified. Risks, benefits, options discussed with patient including but not limited to bleeding, infection, nerve damage, paralysis, failed block, incomplete pain control,  headache, blood pressure changes, nausea, vomiting, reactions to medication, itching, and post partum back pain. Confirmed with bedside nurse the patient's most recent platelet count. Confirmed with the patient that they are not taking any anticoagulation, have any bleeding history or any family history of bleeding disorders. Patient expressed understanding and wishes to proceed. All questions were answered. )        Anesthesia Quick Evaluation

## 2019-05-04 NOTE — Progress Notes (Signed)
Vitals:   05/04/19 0430 05/04/19 0500  BP: 125/81 129/87  Pulse: 93 91  Temp: 98.2 F (36.8 C)    Ctx q 2-4 minutes, getting stronger per pt. Pitocin at 12 mu/min. FHR Cat 1.  Continue increasing pitocin prn strong ctx.

## 2019-05-04 NOTE — Lactation Note (Signed)
This note was copied from a baby's chart. Lactation Consultation Note  Patient Name: Girl Senika Slagle [id number] Date: 05/04/2019 Reason for consult: Initial assessment;Early term 37-38.6wks;1st time breastfeeding P1, 10 hour female infant. Per mom, she has breastfeed infant 3 times since delivery for 15 to 20 minutes. Infant had 2 voids since birth. Mom attempted to latch infant to breast, infant was reluctant to latch at this time only held breast in mouth. Latch assessment not done at this time. Mom was  taught hand expression and mom taught back,  infant was given 9 ml of colostrum by spoon. LC notice mom has flat nipples, mom was given breast shells to wear during the day to help evert nipple shaft out more and to use the hand pump to pre-pump prior to latching infant to breast. Mom was doing STS when LC left the room. Mom shown how to use hand pump & how to disassemble, clean, & reassemble parts. Mom knows to breastfeed infant according hunger cues, 8-12 times within 24 hours and breastfeed on demand. Mom knows to call Nurse or LC if she has any questions, concerns or need assistance with latching infant to breast.  LC discussed I & O. Reviewed Baby & Me book's Breastfeeding Basics.  Mom made aware of O/P services, breastfeeding support groups, community resources, and our phone # for post-discharge questions.  Maternal Data Formula Feeding for Exclusion: No Has patient been taught Hand Expression?: Yes(Infant given 9 ml of colostrum by spoon.) Does the patient have breastfeeding experience prior to this delivery?: No  Feeding Feeding Type: Breast Fed  LATCH Score Latch: Repeated attempts needed to sustain latch, nipple held in mouth throughout feeding, stimulation needed to elicit sucking reflex.  Audible Swallowing: A few with stimulation  Type of Nipple: Flat(compressible )  Comfort (Breast/Nipple): Soft / non-tender  Hold (Positioning): Assistance needed to correctly  position infant at breast and maintain latch.  LATCH Score: 6  Interventions Interventions: Breast feeding basics reviewed;Skin to skin;Pre-pump if needed;Position options;Hand express;Hand pump  Lactation Tools Discussed/Used Tools: Shells;Pump Shell Type: (flat nipples) Breast pump type: Manual WIC Program: Yes Pump Review: Setup, frequency, and cleaning Initiated by:: Danelle Earthly, IBCLC Date initiated:: 05/04/19   Consult Status Consult Status: Follow-up Date: 05/05/19 Follow-up type: In-patient    Danelle Earthly 05/04/2019, 11:04 PM

## 2019-05-04 NOTE — Progress Notes (Signed)
Hypoglycemic Event  CBG: 60  Treatment: 4 oz juice/soda  Symptoms: None  Follow-up CBG: Time:2104 CBG Result:63  Possible Reasons for Event: Unknown  Comments/MD notified:CNM F. Cresenzo-Dishmon  Treatment: Milk, peanut butter and crackers  Symptoms: None  Follow-up CBG: Time:2120 CBG Result:54  Notified: CNM F. Cresenzo-Dishmon  Retested using another glucometer 2130-CBG 64  Notified: CNM F. Cresenzo-Dishmon. Requested to recheck CBG in 30 min.  Follow-up CBG Time: 2206   CBG Result:95      Alexis Richardson   

## 2019-05-04 NOTE — Discharge Summary (Addendum)
Postpartum Discharge Summary     Patient Name: Alexis Richardson DOB: 2001/08/12 MRN: [id number]  Date of admission: 05/03/2019 Delivering Provider: Gavin Pound   Date of discharge: 05/06/2019  Admitting diagnosis: INDUCTION Intrauterine pregnancy: [redacted]w[redacted]d    Secondary diagnosis:  Principal Problem:   SVD (spontaneous vaginal delivery) Active Problems:   History of pulmonary embolus (PE)   Rh negative state in antepartum period   Gestational diabetes   GBS (group B Streptococcus carrier), +RV culture, currently pregnant   Gestational hypertension  Additional problems: None     Discharge diagnosis: Term Pregnancy Delivered, Gestational Hypertension and GDM A1                                                                                                Post partum procedures:rhogam  Induction Methods: AROM, Pitocin and Foley Balloon  Complications: None  Hospital course:  Induction of Labor With Vaginal Delivery   18y.o. yo G1P1001 at 31w5das admitted to the hospital 05/03/2019 for induction of labor.  Indication for induction: Gestational hypertension.  Patient received foley bulb and pitocin with adequate progression.  She received adequate GBS prophylaxis and AROM was performed with complete dilation noted at 1145.  Patient delivered at 120254ithout complications.  Membrane Rupture Time/Date: 9:15 AM ,05/04/2019   Intrapartum Procedures: Episiotomy: None [1]                                         Lacerations:  None [1]  Patient had delivery of a Viable infant.  Information for the patient's newborn:  PrShama, Monfilsirl Lamija [[id number]]Delivery Method: Vaginal, Spontaneous(Filed from Delivery Summary)   05/04/2019  Details of delivery can be found in separate delivery note.  Patient had a routine postpartum course. Patient is discharged home 05/06/19.  Magnesium Sulfate recieved: No BMZ received: No  Physical exam  Vitals:   05/05/19 0407 05/05/19 1429 05/05/19 2153  05/06/19 0518  BP: 135/83 119/69 126/83 124/83  Pulse: 87 88 86 84  Resp: _0 Temp: 98.3 F (36.8 C) 98.5 F (36.9 C) 98.4 F (36.9 C) 97.7 F (36.5 C)  TempSrc: Oral Oral Oral Oral  SpO2: 99% 98%  100%   General: alert, cooperative and no distress Lochia: appropriate Uterine Fundus: firm Incision: N/A DVT Evaluation: No evidence of DVT seen on physical exam. No significant calf/ankle edema. Labs: Lab Results  Component Value Date   WBC 11.6 (H) 05/05/2019   HGB 11.0 (L) 05/05/2019   HCT 32.5 (L) 05/05/2019   MCV 83.8 05/05/2019   PLT 145 (L) 05/05/2019   CMP Latest Ref Rng & Units 05/05/2019  Glucose 70 - 99 mg/dL -  BUN 6 - 20 mg/dL -  Creatinine 0.44 - 1.00 mg/dL 0.72  Sodium 135 - 145 mmol/L -  Potassium 3.5 - 5.1 mmol/L -  Chloride 98 - 111 mmol/L -  CO2 22 - 32 mmol/L -  Calcium 8.9 - 10.3 mg/dL -  Total Protein  6.5 - 8.1 g/dL -  Total Bilirubin 0.3 - 1.2 mg/dL -  Alkaline Phos 38 - 126 U/L -  AST 15 - 41 U/L -  ALT 0 - 44 U/L -    Discharge instruction: per After Visit Summary and "Baby and Me Booklet".  After visit meds:  Allergies as of 05/06/2019   No Known Allergies     Medication List    STOP taking these medications   Accu-Chek FastClix Lancets Misc   Accu-Chek Guide w/Device Kit   glucose blood test strip Commonly known as:  Accu-Chek Guide     TAKE these medications   enoxaparin 40 MG/0.4ML injection Commonly known as:  LOVENOX Inject 0.4 mLs (40 mg total) into the skin daily.   Prenatal Vitamins 0.8 MG tablet Take 1 tablet by mouth daily.       Diet: low salt diet  Activity: Advance as tolerated. Pelvic rest for 6 weeks.   Outpatient follow up:4 weeks, 1 week for BP check. Condoms for BC  Follow up Appt:No future appointments. Follow up Visit: [place] for [facility] Follow up in 1 week(s).   Specialty:  Obstetrics and Gynecology Why:  1 week for a blood pressure check in  the office. Call the office if you do not hear from them next week  Contact information: [address] [place], [place] [id number] [place] [address] [phone]           Please schedule this patient for Postpartum visit in: 1 and 4 weeks with the following provider: Any provider For C/S patients schedule nurse incision check in weeks 2 weeks: no High risk pregnancy complicated by: GDM, HTN, and Bleeding D/O on Lovenox Delivery mode:  SVD Anticipated Birth Control:  Condoms PP Procedures needed: BP check  Schedule Integrated [place] visit: no Restart Lovenox daily. Patient has medications at home.      Newborn Data: Live born female  Birth Weight: 7 lb 6 oz (3345 g) APGAR: 92, 52  Newborn Delivery   Birth date/time:  05/04/2019 12:34:00 Delivery type:  Vaginal, Spontaneous     Baby Feeding: Breast Disposition:home with mother   Noni Saupe I, NP 05/06/2019 10:56 AM

## 2019-05-04 NOTE — Progress Notes (Signed)
Alexis Richardson, [id number]  Subjective -Care assumed of 18 y.o. G1P0 at [redacted]w[redacted]d who presents for IOL s/t GHTN. Pregnancy and medical history also significant for DM-A1 and unprovoked PE with Lovenox mgmt.  In room to meet acquaintance of patient and family.  Patient expresses excitement to see provider.  Patient denies HA, visual disturbances, SOB, and epigastric pain.  Patient denies overall pain and discomfort.    Objective Vitals:   05/04/19 0801 05/04/19 0831  BP: (!) 140/94 121/74  Pulse: 96 82  Resp: 18 18  Temp:     No intake/output data recorded. No intake/output data recorded.  CBG (last 3)  Recent Labs    05/04/19 0206 05/04/19 0639 05/04/19 1005  GLUCAP 106* 81 79     Physical Exam Constitutional:      Appearance: Normal appearance.  HENT:     Head: Normocephalic and atraumatic.  Eyes:     Conjunctiva/sclera: Conjunctivae normal.  Neck:     Musculoskeletal: Normal range of motion.  Cardiovascular:     Rate and Rhythm: Normal rate and regular rhythm.     Pulses: Normal pulses.  Pulmonary:     Effort: Pulmonary effort is normal.     Breath sounds: Normal breath sounds.  Abdominal:     Tenderness: There is no abdominal tenderness.  Genitourinary:    Labia:        Right: No tenderness.        Left: No tenderness.   Musculoskeletal: Normal range of motion.  Skin:    General: Skin is warm and dry.  Neurological:     Mental Status: She is alert and oriented to person, place, and time.  Psychiatric:        Mood and Affect: Mood normal.        Thought Content: Thought content normal.     VWP:VXYIAXKP: 4.5 Effacement (%): 50 Station: -2 Presentation: Vertex Exam by:: Sabas Sous, CNM  Membranes:AROM of Small amt clear fluid  Pelvis: -Proven: No -Adequate: Yes Leopolds: -Position: Vertex -EFW: 7lbs  FHR: 145 bpm, Mod Var, -Decels, +Accels UC:  Difficult to assess  Management Plan: Induction Phase: S/P Foley, Pitocin at 1x1 IUPC for improved  contraction monitoring  Assessment IUP at 37.5wks Cat I FT GHTN DM-A1 H/O PE GBS Positive  Plan -Reviewed POC in regards to  patient expectations and current labor phase. -Patient given options regarding pain management and plans for epidural. -Okay for placement when desired. -Discussed AROM r/b including increased risk of infection, cord prolapse, fetal intolerance, and decreased labor time.  -Discussed IUPC r/b, prior to insertion, including increased risk of infection and ability to adequately monitor quantity and strength of contractions. -No questions or concerns and patient desires to proceed with AROM and IUPC.  -Patient s/p PCN x 4 doses. -Continue other mgmt as ordered.  Cherre Robins, CNM 05/04/2019, 9:08 AM

## 2019-05-05 LAB — CREATININE, SERUM
Creatinine, Ser: 0.72 mg/dL (ref 0.44–1.00)
GFR calc Af Amer: >60 mL/min (ref >60)
GFR calc non Af Amer: >60 mL/min (ref >60)

## 2019-05-05 LAB — CBC
HCT: 32.5 % — ABNORMAL LOW (ref 36.0–46.0)
Hemoglobin: 11 g/dL — ABNORMAL LOW (ref 12.0–15.0)
MCH: 28.4 pg (ref 26.0–34.0)
MCHC: 33.8 g/dL (ref 30.0–36.0)
MCV: 83.8 fL (ref 80.0–100.0)
Platelets: 145 10*3/uL — ABNORMAL LOW (ref 150–400)
RBC: 3.88 MIL/uL (ref 3.87–5.11)
RDW: 14.2 % (ref 11.5–15.5)
WBC: 11.6 10*3/uL — ABNORMAL HIGH (ref 4.0–10.5)
nRBC: 0 % (ref 0.0–0.2)

## 2019-05-05 LAB — GLUCOSE, CAPILLARY: Glucose-Capillary: 72 mg/dL (ref 70–99)

## 2019-05-05 MED ORDER — RHO D IMMUNE GLOBULIN 1500 UNIT/2ML IJ SOSY
300.0000 ug | PREFILLED_SYRINGE | Freq: Once | INTRAMUSCULAR | Status: AC
  Administered 2019-05-05: 12:00:00 300 ug via INTRAVENOUS
  Filled 2019-05-05: qty 2

## 2019-05-05 NOTE — Anesthesia Postprocedure Evaluation (Signed)
Anesthesia Post Note  Patient: Alexis Richardson  Procedure(s) Performed: AN AD HOC LABOR EPIDURAL     Patient location during evaluation: Mother Baby Anesthesia Type: Epidural Level of consciousness: awake and alert Pain management: pain level controlled Vital Signs Assessment: post-procedure vital signs reviewed and stable Respiratory status: spontaneous breathing, nonlabored ventilation and respiratory function stable Cardiovascular status: stable Postop Assessment: no headache, no backache and epidural receding Anesthetic complications: no    Last Vitals:  Vitals:   05/05/19 0007 05/05/19 0407  BP: (!) 150/93 135/83  Pulse: 89 87  Resp:  18  Temp: 36.6 C 36.8 C  SpO2: 96% 99%    Last Pain:  Vitals:   05/05/19 0915  TempSrc:   PainSc: 0-No pain   Pain Goal:                   [name]

## 2019-05-05 NOTE — Progress Notes (Signed)
POSTPARTUM PROGRESS NOTE  Post Partum Day 1  Subjective:  Alexis Richardson is a 18 y.o. G1P1001 s/p SVD at [redacted]w[redacted]d.  No acute events overnight.  Pt denies problems with ambulating, voiding or po intake.  She denies nausea or vomiting.  Pain is well controlled.  She has had flatus.   Lochia Small.   Objective: Blood pressure 135/83, pulse 87, temperature 98.3 F (36.8 C), temperature source Oral, resp. rate 18, last menstrual period 08/13/2018, SpO2 99 %, unknown if currently breastfeeding.  Physical Exam:  General: alert, cooperative and no distress Chest: no respiratory distress Heart:regular rate, distal pulses intact Abdomen: soft, nontender,  Uterine Fundus: firm, appropriately tender DVT Evaluation: No calf swelling or tenderness Extremities: 1+ edema Skin: warm, dry  Recent Labs    05/04/19 1414 05/05/19 0801  HGB 12.5 11.0*  HCT 37.2 32.5*    Assessment/Plan: Alexis Richardson is a 18 y.o. G1P1001 s/p SVD at [redacted]w[redacted]d   PPD# 1 - Doing well Contraception: condoms  Feeding: breast  Dispo: Plan for discharge tomorrow. GBS + Peds for DC tomorrow  # Lovenox given today; history of PE   LOS: 2 days   Nolan Tuazon, Harolyn Rutherford, NP, CNM 05/05/2019, 10:57 AM

## 2019-05-06 LAB — RH IG WORKUP (INCLUDES ABO/RH)
ABO/RH(D): A NEG
Fetal Screen: NEGATIVE
Gestational Age(Wks): 37
Unit division: 0

## 2019-05-06 NOTE — Lactation Note (Signed)
This note was copied from a baby's chart. Lactation Consultation Note  Patient Name: Alexis Richardson Today's Date: 05/06/2019   Infant is 45 hrs old & is an early-term infant. Mom had questions about her milk coming to volume & how to know if infant was "getting any." Mom was taught signs/sound of swallowing. Mom is spoon-feeding prior to putting infant to breast (to entice infant). This is working well for her. Expectations for heavier wet diapers & transitioning stools discussed.   Hand expression was taught to Mom & she was able to return demonstration with good result. Mom thinks her breasts feel a little denser today than yesterday. Breasts are soft to palpation, but Mom reports + breast changes w/pregnancy & it was easy to express colostrum with the size 21 flanges. Mom was shown how to assemble & use hand pump (single- & double-mode) that was included in pump kit. Mom is currently pumping q3hrs. She has a Lansinoh pump at home. I told Mom that once her milk comes to volume, she doesn't have to pump unless she has concerns that infant isn't getting enough.   Mom was very receptive to teaching. Mom also asked that I show her how to wear the shells, which I did. Mom's nipples evert easily with hand expression.   During consult, Dad appeared to be sleeping on couch with infant on chest. I informed Mom that put infant at fall risk. Mom talked to Dad, who was actually still awake. He later changed his position so that infant was between his body & the couch. I informed Mom that was also unsafe. RN was made aware & will review safe sleep.   ,  [place] 05/06/2019, 10:17 AM    

## 2019-05-06 NOTE — Lactation Note (Signed)
This note was copied from a baby's chart. Lactation Consultation Note  Patient Name: Alexis Richardson [id number] Date: 05/06/2019 Reason for consult: Follow-up assessment;Early term 37-38.6wks;Infant weight loss P1, 36 hour female infant, weight loss -2%. Infant had 5 voids and 3 stools since birth.  Per mom, she feels breastfeeding is going well, she has been pre-pumping prior to latching infant to breast. Infant is currently breastfeeding 15-30 minutes most feeding.  Infant was cuing as LC entered the room, mom latched infant on left breast using cross cradle hold, infant latched with wide gape, swallows heard, infant breastfeeding (5 minutes) still breastfeeding when LC left the room.  LC did not assist mom in latch. Mom will continue to breastfeed according to hunger cues, on demand, 8 to 12 times within 24 hours. Mom knows to call Nurse or LC if she has any breastfeeding questions, concerns or need assistance with latching infant to breast.  Maternal Data    Feeding Feeding Type: Breast Fed  LATCH Score Latch: Grasps breast easily, tongue down, lips flanged, rhythmical sucking.  Audible Swallowing: Spontaneous and intermittent  Type of Nipple: Flat  Comfort (Breast/Nipple): Soft / non-tender  Hold (Positioning): No assistance needed to correctly position infant at breast.  LATCH Score: 9  Interventions    Lactation Tools Discussed/Used     Consult Status Consult Status: Follow-up Date: 05/06/19 Follow-up type: In-patient    Danelle Earthly 05/06/2019, 12:34 AM

## 2019-05-06 NOTE — Discharge Instructions (Signed)
Vaginal Delivery, Care After °Refer to this sheet in the next few weeks. These instructions provide you with information about caring for yourself after vaginal delivery. Your health care provider may also give you more specific instructions. Your treatment has been planned according to current medical practices, but problems sometimes occur. Call your health care provider if you have any problems or questions. °What can I expect after the procedure? °After vaginal delivery, it is common to have: °· Some bleeding from your vagina. °· Soreness in your abdomen, your vagina, and the area of skin between your vaginal opening and your anus (perineum). °· Pelvic cramps. °· Fatigue. °Follow these instructions at home: °Medicines °· Take over-the-counter and prescription medicines only as told by your health care provider. °· If you were prescribed an antibiotic medicine, take it as told by your health care provider. Do not stop taking the antibiotic until it is finished. °Driving ° °· Do not drive or operate heavy machinery while taking prescription pain medicine. °· Do not drive for 24 hours if you received a sedative. °Lifestyle °· Do not drink alcohol. This is especially important if you are breastfeeding or taking medicine to relieve pain. °· Do not use tobacco products, including cigarettes, chewing tobacco, or e-cigarettes. If you need help quitting, ask your health care provider. °Eating and drinking °· Drink at least 8 eight-ounce glasses of water every day unless you are told not to by your health care provider. If you choose to breastfeed your baby, you may need to drink more water than this. °· Eat high-fiber foods every day. These foods may help prevent or relieve constipation. High-fiber foods include: °? Whole grain cereals and breads. °? Brown rice. °? Beans. °? Fresh fruits and vegetables. °Activity °· Return to your normal activities as told by your health care provider. Ask your health care provider what  activities are safe for you. °· Rest as much as possible. Try to rest or take a nap when your baby is sleeping. °· Do not lift anything that is heavier than your baby or 10 lb (4.5 kg) until your health care provider says that it is safe. °· Talk with your health care provider about when you can engage in sexual activity. This may depend on your: °? Risk of infection. °? Rate of healing. °? Comfort and desire to engage in sexual activity. °Vaginal Care °· If you have an episiotomy or a vaginal tear, check the area every day for signs of infection. Check for: °? More redness, swelling, or pain. °? More fluid or blood. °? Warmth. °? Pus or a bad smell. °· Do not use tampons or douches until your health care provider says this is safe. °· Watch for any blood clots that may pass from your vagina. These may look like clumps of dark red, brown, or black discharge. °General instructions °· Keep your perineum clean and dry as told by your health care provider. °· Wear loose, comfortable clothing. °· Wipe from front to back when you use the toilet. °· Ask your health care provider if you can shower or take a bath. If you had an episiotomy or a perineal tear during labor and delivery, your health care provider may tell you not to take baths for a certain length of time. °· Wear a bra that supports your breasts and fits you well. °· If possible, have someone help you with household activities and help care for your baby for at least a few days after you   leave the hospital. °· Keep all follow-up visits for you and your baby as told by your health care provider. This is important. °Contact a health care provider if: °· You have: °? Vaginal discharge that has a bad smell. °? Difficulty urinating. °? Pain when urinating. °? A sudden increase or decrease in the frequency of your bowel movements. °? More redness, swelling, or pain around your episiotomy or vaginal tear. °? More fluid or blood coming from your episiotomy or vaginal  tear. °? Pus or a bad smell coming from your episiotomy or vaginal tear. °? A fever. °? A rash. °? Little or no interest in activities you used to enjoy. °? Questions about caring for yourself or your baby. °· Your episiotomy or vaginal tear feels warm to the touch. °· Your episiotomy or vaginal tear is separating or does not appear to be healing. °· Your breasts are painful, hard, or turn red. °· You feel unusually sad or worried. °· You feel nauseous or you vomit. °· You pass large blood clots from your vagina. If you pass a blood clot from your vagina, save it to show to your health care provider. Do not flush blood clots down the toilet without having your health care provider look at them. °· You urinate more than usual. °· You are dizzy or light-headed. °· You have not breastfed at all and you have not had a menstrual period for 12 weeks after delivery. °· You have stopped breastfeeding and you have not had a menstrual period for 12 weeks after you stopped breastfeeding. °Get help right away if: °· You have: °? Pain that does not go away or does not get better with medicine. °? Chest pain. °? Difficulty breathing. °? Blurred vision or spots in your vision. °? Thoughts about hurting yourself or your baby. °· You develop pain in your abdomen or in one of your legs. °· You develop a severe headache. °· You faint. °· You bleed from your vagina so much that you fill two sanitary pads in one hour. °This information is not intended to replace advice given to you by your health care provider. Make sure you discuss any questions you have with your health care provider. °Document Released: 11/19/2000 Document Revised: 05/05/2016 Document Reviewed: 12/07/2015 °Elsevier Interactive Patient Education © 2019 [employer] Inc. ° °

## 2019-05-16 ENCOUNTER — Institutional Professional Consult (permissible substitution): Payer: Medicaid Other

## 2019-05-16 ENCOUNTER — Telehealth: Payer: Self-pay | Admitting: Clinical

## 2019-05-16 ENCOUNTER — Telehealth: Payer: Self-pay

## 2019-05-16 DIAGNOSIS — F53 Postpartum depression: Secondary | ICD-10-CM

## 2019-05-16 NOTE — Telephone Encounter (Signed)
Received a call from Almyra Free from [facility] stating that she received a message from the pt that she felt depressed and that she felt like harming herself with no plan.  Almyra Free said the pt also stated that she had thought about cutting and does not want to do harm to the baby.  Pt scroed at [address] on [place].  Almyra Free requests a call back for f/u.  Roselyn Reef notified requesting to contact pt today.  Attempted to contact pt was unable to LM due to VM box being full.  MyChart message sent.  Almyra Free notified and Almyra Free stated that she will text the pt letting her know that Roselyn Reef will call her today around 4pm.  Almyra Free also offered that if we are not able to reach her to let her know as well.

## 2019-05-16 NOTE — Telephone Encounter (Signed)
Pt says she had thoughts of "slitting her wrists" last night, and contacted Almyra Free from [facility]. Pt says this is the first time she had this thought, and it scared her, that she didn't want to hurt herself, but "wanna get away".Pt says she may have felt depressed the year prior to pregnancy due to "life stress", including childhood trauma. Pt is sleeping "every 2 hours", feeling irritable, anxious, angry and lashing out at FOB;  feeling isolated during COVID-19 pandemic, as she was "unable to celebrate her baby" with a shower.   Pt feels best when she is taking daily walks, while mom watches baby, plans to continue talking to [place] and begin therapy with [facility]. Pt is not having suicidal thoughts today, and agrees to go to the ED if SI returns. Pt also agrees if she is uncertain whether or not to go to the ED, she will call [facility] [phone] and/or [facility] [phone] 24-hour lines.   Pt agrees to schedule a phone follow-up with [facility] at [facility] on Thursday, June 18 to confirm she has established with [facility].

## 2019-05-17 NOTE — Telephone Encounter (Signed)
Patient with thoughts of self--harm, has been in touch with [facility] and behavioral health, has f/u with behavioral health to ensure she is getting therapy with [facility]. Per LCSW, patient without suicidal thoughts and agrees to go to ED if she does.   Feliz Beam, M.D. Attending Center for [employer] farm [employer])

## 2019-05-18 ENCOUNTER — Telehealth: Payer: Self-pay | Admitting: Family Medicine

## 2019-05-18 NOTE — Telephone Encounter (Signed)
Spoke with patient about her appointment on 6/15 @ 9:40. Patient was instructed to wear a face mask for the entire appointment and no visitors are allowed with her. Patient was screened for covid-19 symptoms and denied having any.

## 2019-05-21 ENCOUNTER — Ambulatory Visit: Payer: Medicaid Other

## 2019-05-28 ENCOUNTER — Telehealth: Payer: Self-pay | Admitting: Clinical

## 2019-05-28 NOTE — Telephone Encounter (Signed)
Attempt to contact pt for follow-up; Left HIPPA-compliant message to call back Alexis Richardson from [facility] at [phone].   

## 2019-05-31 ENCOUNTER — Other Ambulatory Visit: Payer: Self-pay

## 2019-05-31 ENCOUNTER — Ambulatory Visit: Payer: Medicaid Other | Admitting: Clinical

## 2019-05-31 NOTE — BH Specialist Note (Signed)
Pt did not arrive to [facility] video, and did not answer the phone; Left HIPPA-compliant message to call back Roselyn Reef from [facility] at [phone], and left MyChart message for patient.   Integrated Behavioral Health Visit via Telemedicine (Telephone)  05/31/2019 Alexis Richardson [id number]   Garlan Fair

## 2019-06-07 ENCOUNTER — Ambulatory Visit (INDEPENDENT_AMBULATORY_CARE_PROVIDER_SITE_OTHER): Payer: Medicaid Other | Admitting: Clinical

## 2019-06-07 ENCOUNTER — Other Ambulatory Visit: Payer: Self-pay

## 2019-06-07 DIAGNOSIS — F4323 Adjustment disorder with mixed anxiety and depressed mood: Secondary | ICD-10-CM | POA: Diagnosis not present

## 2019-06-07 NOTE — BH Specialist Note (Signed)
Integrated Behavioral Health via Telemedicine Video Visit  06/07/2019 Alexis Richardson [id number]  Number of Integrated Behavioral Health visits: 2 Session Start time: 1:04  Session End time: 1:34 Total time: 30 minutes  Referring Provider: Leroy LibmanKelly Davis, MD Type of Visit: Video [facility] All persons participating in visit: Patient [facility] and [facility]  Confirmed patient's address: Yes  Confirmed patient's phone number: Yes  Any changes to demographics: No   Confirmed patient's insurance: Yes  Any changes to patient's insurance: No   Discussed confidentiality: Yes   I connected with Alexis Richardson  by a video enabled telemedicine application and verified that I am speaking with the correct person using two identifiers.     I discussed the limitations of evaluation and management by telemedicine and the availability of in person appointments.  I discussed that the purpose of this visit is to provide behavioral health care while limiting exposure to the novel coronavirus.   Discussed there is a possibility of technology failure and discussed alternative modes of communication if that failure occurs.  I discussed that engaging in this video visit, they consent to the provision of behavioral healthcare and the services will be billed under their insurance.  Patient and/or legal guardian expressed understanding and consented to video visit: Yes   PRESENTING CONCERNS: Patient and/or family reports the following symptoms/concerns: Pt states that the past two weeks she has been doing much better than the first two weeks postpartum, her primary symptom is daily worry that she attributes to adjusting to new motherhood. Pt has regained her appetite, is sleeping well (up to 6 hours/night with her mother watching baby), is starting a new job working from home, no SI, no HI.  Duration of problem: Postpartum; Severity of problem:  mild  STRENGTHS (Protective Factors/Coping Skills): Strong social support  GOALS ADDRESSED: Patient will: 1.  Maintain reduction of symptoms of: anxiety and depression  2.  Increase knowledge and/or ability of: healthy habits  3.  Demonstrate ability to: Increase healthy adjustment to current life circumstances and Increase adequate support systems for patient/family  INTERVENTIONS: Interventions utilized:  Psychoeducation and/or Health Education and Link to [employer] [employer] Standardized Assessments completed: GAD-7 and PHQ 9  ASSESSMENT: Patient currently experiencing Adjustment disorder with anxious and depressed mood.   Patient may benefit from psychoeducation and brief therapeutic interventions regarding coping with symptoms of anxiety and depression .  PLAN: 1. Follow up with behavioral health clinician on : As needed 2. Behavioral recommendations:  -Call [facility] today to set up appointment -If SI returns, go to ED or call Mobile Crisis, as discussed -Continue prioritizing healthy eating and sleeping -Consider registering for and attending at least one new mom online support at either [email or web address] or [email or web address]  3. Referral(s): [facility]  I discussed the assessment and treatment plan with the patient and/or parent/guardian. They were provided an opportunity to ask questions and all were answered. They agreed with the plan and demonstrated an understanding of the instructions.   They were advised to call back or seek an in-person evaluation if the symptoms worsen or if the condition fails to improve as anticipated.  Valetta CloseJamie C [facility] Center[facility]Mann[facility]  Depression screen [facility] 2/9 06/07/2019 02/07/2019 01/09/2019 11/08/2018 10/09/2018  Decreased Interest 0 1 1 1  0  Down, Depressed, Hopeless 1 0 0 0 1  PHQ - 2 Score 1 1 1 1 1   Altered sleeping 0 1 0 0 1  Tired, decreased energy  0 1 1 0 0  Change in appetite 1 0 1 0 0  Feeling bad or  failure about yourself  0 0 0 0 0  Trouble concentrating 0 0 0 0 0  Moving slowly or fidgety/restless 0 0 0 0 0  Suicidal thoughts 0 0 0 0 0  PHQ-9 Score 2 3 3 1 2    GAD 7 : Generalized Anxiety Score 06/07/2019 02/07/2019 01/09/2019 11/08/2018  Nervous, Anxious, on Edge 0 0 0 0  Control/stop worrying 3 0 0 0  Worry too much - different things 1 0 0 0  Trouble relaxing 0 0 0 0  Restless 0 0 0 0  Easily annoyed or irritable 0 1 1 1   Afraid - awful might happen 0 0 0 0  Total GAD 7 Score 4 1 1  1

## 2019-06-11 ENCOUNTER — Other Ambulatory Visit: Payer: Self-pay | Admitting: *Deleted

## 2019-06-11 DIAGNOSIS — O24429 Gestational diabetes mellitus in childbirth, unspecified control: Secondary | ICD-10-CM

## 2019-06-14 ENCOUNTER — Telehealth: Payer: Self-pay | Admitting: Obstetrics and Gynecology

## 2019-06-14 NOTE — Telephone Encounter (Signed)
Spoke with appointment about her appointment on 7/10 @ 8:15. Patient instructed to wear a face mask for the entire appointment and no visitors are allowed with her. Patient screened for covid symptoms and denied having any. Patient stated that she has to be at work by 10 and wanted to know if she had to stay the full duration for the 2 hour. I verbalized that I would find out this information and let her know.

## 2019-06-15 ENCOUNTER — Encounter: Payer: Self-pay | Admitting: Obstetrics and Gynecology

## 2019-06-15 ENCOUNTER — Ambulatory Visit (INDEPENDENT_AMBULATORY_CARE_PROVIDER_SITE_OTHER): Payer: Medicaid Other | Admitting: Obstetrics and Gynecology

## 2019-06-15 ENCOUNTER — Other Ambulatory Visit: Payer: Medicaid Other

## 2019-06-15 ENCOUNTER — Other Ambulatory Visit: Payer: Self-pay

## 2019-06-15 VITALS — BP 121/80 | HR 75 | Temp 98.7°F | Ht 63.0 in | Wt 205.8 lb

## 2019-06-15 DIAGNOSIS — O24429 Gestational diabetes mellitus in childbirth, unspecified control: Secondary | ICD-10-CM

## 2019-06-15 DIAGNOSIS — D696 Thrombocytopenia, unspecified: Secondary | ICD-10-CM

## 2019-06-15 LAB — CBC
Hematocrit: 36.4 % (ref 34.0–46.6)
Hemoglobin: 12.2 g/dL (ref 11.1–15.9)
MCH: 27.7 pg (ref 26.6–33.0)
MCHC: 33.5 g/dL (ref 31.5–35.7)
MCV: 83 fL (ref 79–97)
Platelets: 194 10*3/uL (ref 150–450)
RBC: 4.4 x10E6/uL (ref 3.77–5.28)
RDW: 14.7 % (ref 11.7–15.4)
WBC: 7.3 10*3/uL (ref 3.4–10.8)

## 2019-06-15 NOTE — Progress Notes (Signed)
Obstetrics and Gynecology Postpartum Visit  Appointment Date: 06/15/2019  OBGYN Clinic: [facility]  Primary Care Provider: Macie Burows  Chief Complaint:  Chief Complaint  Patient presents with  . Postpartum Care    History of Present Illness: Alexis Richardson is a 18 y.o. African-American G1P1001 (No LMP recorded.), seen for the above chief complaint. Her past medical history is significant for gHTN, low plts, GDM, teen pregnancy   She is s/p SVD/intact perineum on 5/29; she was discharged to home on PPD#2  Review of Systems:  as noted in the History of Present Illness.  Patient Active Problem List   Diagnosis Date Noted  . Gestational thrombocytopenia ([facility]) 05/03/2019  . Gestational hypertension, third trimester 04/13/2019  . Gestational diabetes 03/26/2019  . Obesity in pregnancy 03/12/2019  . Rh negative state in antepartum period 10/09/2018  . History of pulmonary embolus (PE)   . Keloid of skin 06/24/2017    Medications Alexis Richardson had no medications administered during this visit. Current Outpatient Medications  Medication Sig Dispense Refill  . Prenatal Multivit-Min-Fe-FA (PRENATAL VITAMINS) 0.8 MG tablet Take 1 tablet by mouth daily. 30 tablet 12  . enoxaparin (LOVENOX) 40 MG/0.4ML injection Inject 0.4 mLs (40 mg total) into the skin daily. (Patient not taking: Reported on 06/15/2019) 90 Syringe 4   No current facility-administered medications for this visit.     Allergies Patient has no known allergies.  Physical Exam:  BP 121/80   Pulse 75   Temp 98.7 F (37.1 C)   Ht 5\' 3"  (1.6 m)   Wt 205 lb 12.8 oz (93.4 kg)   Breastfeeding Yes   BMI 36.46 kg/m  Body mass index is 36.46 kg/m. General appearance: Well nourished, well developed female in no acute distress.  Neuro/Psych:  Normal mood and affect.  Skin:  Warm and dry.   Laboratory: None  PP Depression Screening:  EPDS score 4  Assessment: pt doing well  Plan: rpt  gdm testing and plts. Pt not interested in bc. Pt feeding q1-2h. D/w pt re: lactational amenorrhea. Stop lovenox   RTC 3-68m for annual   Durene Romans MD Attending [facility])

## 2019-06-16 LAB — GLUCOSE TOLERANCE, 2 HOURS
Glucose, 2 hour: 75 mg/dL (ref 65–139)
Glucose, GTT - Fasting: 71 mg/dL (ref 65–99)

## 2019-06-18 ENCOUNTER — Encounter: Payer: Self-pay | Admitting: Obstetrics and Gynecology

## 2019-10-03 DIAGNOSIS — L910 Hypertrophic scar: Secondary | ICD-10-CM | POA: Diagnosis not present

## 2019-12-14 DIAGNOSIS — Z113 Encounter for screening for infections with a predominantly sexual mode of transmission: Secondary | ICD-10-CM | POA: Diagnosis not present

## 2020-09-11 ENCOUNTER — Other Ambulatory Visit: Payer: Self-pay | Admitting: Obstetrics and Gynecology

## 2020-09-12 LAB — COMPLETE METABOLIC PANEL WITH GFR
AG Ratio: 1.2 (calc) (ref 1.0–2.5)
ALT: 12 U/L (ref 6–29)
AST: 14 U/L (ref 10–30)
Albumin: 4.2 g/dL (ref 3.6–5.1)
Alkaline phosphatase (APISO): 68 U/L (ref 31–125)
BUN: 15 mg/dL (ref 7–25)
CO2: 22 mmol/L (ref 20–32)
Calcium: 9.9 mg/dL (ref 8.6–10.2)
Chloride: 102 mmol/L (ref 98–110)
Creat: 0.85 mg/dL (ref 0.50–1.10)
GFR, Est African American: 114 mL/min/{1.73_m2} (ref >=60)
GFR, Est Non African American: 98 mL/min/{1.73_m2} (ref >=60)
Globulin: 3.5 g/dL (calc) (ref 1.9–3.7)
Glucose, Bld: 65 mg/dL (ref 65–99)
Potassium: 4.5 mmol/L (ref 3.5–5.3)
Sodium: 137 mmol/L (ref 135–146)
Total Bilirubin: 0.3 mg/dL (ref 0.2–1.2)
Total Protein: 7.7 g/dL (ref 6.1–8.1)

## 2020-09-12 LAB — CBC
HCT: 39.3 % (ref 35.0–45.0)
Hemoglobin: 12.9 g/dL (ref 11.7–15.5)
MCH: 28.8 pg (ref 27.0–33.0)
MCHC: 32.8 g/dL (ref 32.0–36.0)
MCV: 87.7 fL (ref 80.0–100.0)
MPV: 12.6 fL — ABNORMAL HIGH (ref 7.5–12.5)
Platelets: 287 10*3/uL (ref 140–400)
RBC: 4.48 10*6/uL (ref 3.80–5.10)
RDW: 13.1 % (ref 11.0–15.0)
WBC: 10.1 10*3/uL (ref 3.8–10.8)

## 2020-09-12 LAB — LIPID PANEL
Cholesterol: 191 mg/dL (ref <200)
HDL: 51 mg/dL (ref >=50)
LDL Cholesterol (Calc): 117 mg/dL (calc) — ABNORMAL HIGH
Non-HDL Cholesterol (Calc): 140 mg/dL (calc) — ABNORMAL HIGH (ref <130)
Total CHOL/HDL Ratio: 3.7 (calc) (ref <5.0)
Triglycerides: 121 mg/dL (ref <150)

## 2020-09-12 LAB — VITAMIN D 25 HYDROXY (VIT D DEFICIENCY, FRACTURES): Vit D, 25-Hydroxy: 23 ng/mL — ABNORMAL LOW (ref 30–100)

## 2020-09-12 LAB — TSH: TSH: 2.78 mIU/L

## 2020-10-15 ENCOUNTER — Other Ambulatory Visit: Payer: Self-pay

## 2020-10-15 ENCOUNTER — Ambulatory Visit (INDEPENDENT_AMBULATORY_CARE_PROVIDER_SITE_OTHER): Payer: Medicaid Other | Admitting: Plastic Surgery

## 2020-10-15 ENCOUNTER — Encounter: Payer: Self-pay | Admitting: Plastic Surgery

## 2020-10-15 VITALS — BP 121/69 | HR 66 | Temp 98.0°F | Ht 64.0 in | Wt 227.6 lb

## 2020-10-15 DIAGNOSIS — M545 Low back pain, unspecified: Secondary | ICD-10-CM | POA: Diagnosis not present

## 2020-10-15 DIAGNOSIS — N62 Hypertrophy of breast: Secondary | ICD-10-CM

## 2020-10-15 DIAGNOSIS — M546 Pain in thoracic spine: Secondary | ICD-10-CM | POA: Diagnosis not present

## 2020-10-15 DIAGNOSIS — M4004 Postural kyphosis, thoracic region: Secondary | ICD-10-CM | POA: Diagnosis not present

## 2020-10-15 NOTE — Progress Notes (Signed)
Referring Provider Thresa Ross, MD [address] [place],  [place] [address]   CC:  Chief Complaint  Patient presents with  . Advice Only      Alexis Richardson is an 19 y.o. female.  HPI: Patient presents to discuss breast reduction.  She is had years of back pain, neck pain and shoulder grooving related to her large breast.  She tried over-the-counter medications, warm packs, cold packs and supportive bras with little relief of her pain.  She also gets rashes beneath her breast that have been refractory to prescription medication.  She is not a smoker and is not diabetic.  She has been to the chiropractor with little relief.  She wants to be around a C cup.  She has not had any previous breast biopsies or procedures.  She has had one child over a year ago.  She would like to have surgical breast reduction.  No Known Allergies  Outpatient Encounter Medications as of 10/15/2020  Medication Sig  . [DISCONTINUED] enoxaparin (LOVENOX) 40 MG/0.4ML injection Inject 0.4 mLs (40 mg total) into the skin daily. (Patient not taking: Reported on 06/15/2019)  . [DISCONTINUED] Prenatal Multivit-Min-Fe-FA (PRENATAL VITAMINS) 0.8 MG tablet Take 1 tablet by mouth daily.   No facility-administered encounter medications on file as of 10/15/2020.     Past Medical History:  Diagnosis Date  . GBS (group B Streptococcus carrier), +RV culture, currently pregnant 05/01/2019   Treat in labor  . Gestational diabetes   . Gestational diabetes 03/26/2019   Neg postpartum GTT  . Gestational hypertension 05/03/2019  . Gestational hypertension, third trimester 04/13/2019   BL PreE labs Normal 04/12/2019  . Gestational thrombocytopenia ([facility]) 05/03/2019  . Pulmonary embolism ([facility])    a. Dx 09/2017, had IUD removed in case this contributed.  . Rh negative state in antepartum period 10/09/2018   [ ]  Rhogam at 28 weeks and postpartum  . Right ventricular dilation    a. at time of PE 09/2017.    Past Surgical  History:  Procedure Laterality Date  . KELOID EXCISION      Family History  Problem Relation Age of Onset  . Hypertension Mother   . Hypertension Maternal Aunt   . Cancer Maternal Grandfather   . Pulmonary embolism Neg Hx   . Heart disease Neg Hx     Social History   Social History Narrative  . Not on file     Review of Systems General: Denies fevers, chills, weight loss CV: Denies chest pain, shortness of breath, palpitations  Physical Exam Vitals with BMI 10/15/2020 06/15/2019 05/06/2019  Height 5\' 4"  5\' 3"  -  Weight 227 lbs 10 oz 205 lbs 13 oz -  BMI 39.05 36.46 -  Systolic 121 121 05/08/2019  Diastolic 69 80 83  Pulse 66 75 84    General:  No acute distress,  Alert and oriented, Non-Toxic, Normal speech and affect Breast: She has grade 3 ptosis.  Sternal notch to nipple is 34 cm on the right and 35 cm on the left.  Nipple to fold is 16 cm on the right and 17 cm on the left.  I do not see any obvious scars or masses.  Assessment/Plan The patient has bilateral symptomatic macromastia.  She is a good candidate for a breast reduction.  She is interested in pursuing surgical treatment.  She has tried supportive garments and fitted bras with no relief.  The details of breast reduction surgery were discussed.  I explained the procedure  in detail along the with the expected scars.  The risks were discussed in detail and include bleeding, infection, damage to surrounding structures, need for additional procedures, nipple loss, change in nipple sensation, persistent pain, contour irregularities and asymmetries.  I explained that breast feeding is often not possible after breast reduction surgery.  We discussed the expected postoperative course with an overall recovery period of about 1 month.  She demonstrated full understanding of all risks.  We discussed her personal risk factors.  I anticipate approximately 825g of tissue removed from each side.   [name] 10/15/2020, 2:12 PM

## 2020-12-18 ENCOUNTER — Encounter: Payer: Self-pay | Admitting: Surgical

## 2020-12-18 ENCOUNTER — Ambulatory Visit (INDEPENDENT_AMBULATORY_CARE_PROVIDER_SITE_OTHER): Payer: Medicaid Other | Admitting: Surgical

## 2020-12-18 ENCOUNTER — Other Ambulatory Visit: Payer: Self-pay

## 2020-12-18 VITALS — BP 115/84 | HR 88 | Ht 64.0 in | Wt 227.0 lb

## 2020-12-18 DIAGNOSIS — M545 Low back pain, unspecified: Secondary | ICD-10-CM

## 2020-12-18 DIAGNOSIS — N62 Hypertrophy of breast: Secondary | ICD-10-CM

## 2020-12-18 DIAGNOSIS — M4004 Postural kyphosis, thoracic region: Secondary | ICD-10-CM

## 2020-12-18 DIAGNOSIS — M546 Pain in thoracic spine: Secondary | ICD-10-CM

## 2020-12-18 MED ORDER — HYDROCODONE-ACETAMINOPHEN 5-325 MG PO TABS
1.0000 | ORAL_TABLET | Freq: Three times a day (TID) | ORAL | 0 refills | Status: DC | PRN
Start: 2020-12-18 — End: 2020-12-19

## 2020-12-18 MED ORDER — ONDANSETRON HCL 4 MG PO TABS: 4.0000 mg | ORAL_TABLET | Freq: Three times a day (TID) | ORAL | 0 refills | Status: DC | PRN

## 2020-12-18 MED ORDER — ENOXAPARIN SODIUM 40 MG/0.4ML ~~LOC~~ SOLN
40.0000 mg | SUBCUTANEOUS | 0 refills | Status: DC
Start: 2020-12-18 — End: 2021-01-05

## 2020-12-18 NOTE — H&P (View-Only) (Signed)
   Patient ID: Alexis Richardson, female    DOB: 09/08/2001, 19 y.o.   MRN: [id number]  Chief Complaint  Patient presents with  . Pre-op Exam      ICD-10-CM   1. Macromastia  N62   2. Back pain of thoracolumbar region  M54.50    M54.6   3. Postural kyphosis, thoracic region  M40.04      History of Present Illness: Alexis Richardson is a 19 y.o.  female  with a history of macromastia.  She presents for preoperative evaluation for upcoming procedure, Bilateral Breast Reduction, scheduled for 01/05/2021 with Dr.  Pace  The patient has not had problems with anesthesia.  Personal history of pulmonary embolism. no family history of DVT/PE.  No family or personal history of bleeding or clotting disorders.  Patient is not currently taking any blood thinners.  No history of CVA/MI.   Summary of Previous Visit: Patient is not a smoker or diabetic.  She wants to be around a C cup.  No previous breast biopsies or procedures.  She had a child over a year ago. Anticipate 825 g of breast tissue to be removed from each side.  Job: Student  PMH Significant for: Pulmonary embolism in 2018.  Patient does have a history of protein C, protein S, lupus anticoagulant, factor V Leiden, homocystine serum, prothrombin gene mutation beta-2 glycoprotein and cardiolipin antibody testing.  All results were normal per EMR review in 2019.  patient does have a history of keloiding, this has been treated at a plastic surgery office in [place] previously. She reports that she has had a keloiding of her ear piercings as well as her belly piercing. She previously had her ear and bellybutton keloids excised, her ears have healed well after multiple rounds of steroids.  She is still undergoing treatment for her umbilicus  Past Medical History: Allergies: No Known Allergies  Current Medications:  Current Outpatient Medications:  .  enoxaparin (LOVENOX) 40 MG/0.4ML injection, Inject 0.4 mLs (40 mg total) into the skin daily  for 7 days. Patient to start day after surgery, 01/06/2021, Disp: 2.8 mL, Rfl: 0 .  HYDROcodone-acetaminophen (NORCO) 5-325 MG tablet, Take 1 tablet by mouth every 8 (eight) hours as needed for up to 7 days for severe pain., Disp: 21 tablet, Rfl: 0 .  ondansetron (ZOFRAN) 4 MG tablet, Take 1 tablet (4 mg total) by mouth every 8 (eight) hours as needed for nausea or vomiting., Disp: 20 tablet, Rfl: 0  Past Medical Problems: Past Medical History:  Diagnosis Date  . GBS (group B Streptococcus carrier), +RV culture, currently pregnant 05/01/2019   Treat in labor  . Gestational diabetes   . Gestational diabetes 03/26/2019   Neg postpartum GTT  . Gestational hypertension 05/03/2019  . Gestational hypertension, third trimester 04/13/2019   BL PreE labs Normal 04/12/2019  . Gestational thrombocytopenia ([facility]) 05/03/2019  . Pulmonary embolism ([facility])    a. Dx 09/2017, had IUD removed in case this contributed.  . Rh negative state in antepartum period 10/09/2018   [ ] Rhogam at 28 weeks and postpartum  . Right ventricular dilation    a. at time of PE 09/2017.    Past Surgical History: Past Surgical History:  Procedure Laterality Date  . KELOID EXCISION      Social History: Social History   Socioeconomic History  . Marital status: Single    Spouse name: Not on file  . Number of children: Not on file  . Years of education:   Not on file  . Highest education level: Not on file  Occupational History  . Not on file  Tobacco Use  . Smoking status: Never Smoker  . Smokeless tobacco: Never Used  Vaping Use  . Vaping Use: Never used  Substance and Sexual Activity  . Alcohol use: No  . Drug use: Not Currently    Types: Marijuana    Comment: LAST TIME SMOKED - BEFORE PREG  . Sexual activity: Yes    Birth control/protection: None  Other Topics Concern  . Not on file  Social History Narrative  . Not on file   Social Determinants of Health   Financial Resource Strain: Not on file  Food  Insecurity: Not on file  Transportation Needs: Not on file  Physical Activity: Not on file  Stress: Not on file  Social Connections: Not on file  Intimate Partner Violence: Not on file    Family History: Family History  Problem Relation Age of Onset  . Hypertension Mother   . Hypertension Maternal Aunt   . Cancer Maternal Grandfather   . Pulmonary embolism Neg Hx   . Heart disease Neg Hx     Review of Systems: Review of Systems  Constitutional: Negative.   Respiratory: Negative.   Cardiovascular: Negative.   Gastrointestinal: Negative.   Skin: Negative.   Neurological: Negative.     Physical Exam: Vital Signs BP 115/84 (BP Location: Left Arm, Patient Position: Sitting, Cuff Size: Large)   Pulse 88   Ht 5\' 4"  (1.626 m)   Wt 227 lb (103 kg)   SpO2 100%   BMI 38.96 kg/m  Physical Exam Constitutional:      General: She is not in acute distress.    Appearance: Normal appearance. She is not ill-appearing.  HENT:     Head: Normocephalic and atraumatic.  Eyes:     Pupils: Pupils are equal, round Neck:     Musculoskeletal: Normal range of motion.  Cardiovascular:     Rate and Rhythm: Normal rate and regular rhythm.     Pulses: Normal pulses.     Heart sounds: Normal heart sounds. No murmur.  Pulmonary:     Effort: Pulmonary effort is normal. No respiratory distress.     Breath sounds: Normal breath sounds. No wheezing.  Abdominal:     General: Abdomen is flat. There is no distension.     Palpations: Abdomen is soft.     Tenderness: There is no abdominal tenderness.  Musculoskeletal: Normal range of motion.  Skin:    General: Skin is warm and dry.     Findings: No erythema or rash.  Neurological:     General: No focal deficit present.     Mental Status: She is alert and oriented to person, place, and time. Mental status is at baseline.     Motor: No weakness.  Psychiatric:        Mood and Affect: Mood normal.        Behavior: Behavior normal.     Assessment/Plan: The patient is scheduled for bilateral breast reduction with Dr. .  Risks, benefits, and alternatives of procedure discussed, questions answered and consent obtained.    Smoking Status: Non-smoker; Counseling Given? N/A Last Mammogram: N/A; Results: N/A  Caprini Score: 6, high; Risk Factors include: BMI greater than 25, history of DVT/PE and length of planned surgery. Recommendation for mechanical and pharmacological prophylaxis. Encourage early ambulation.  Plan for 7 days postop of 40 mg of Lovenox daily.  Pictures obtained: Consult  Post-op Rx sent to pharmacy: Norco, Zofran  Patient was provided with the breast reduction and General Surgical Risk consent document and Pain Medication Agreement prior to their appointment.  They had adequate time to read through the risk consent documents and Pain Medication Agreement. We also discussed them in person together during this preop appointment. All of their questions were answered to their satisfaction.  Recommended calling if they have any further questions.  Risk consent form and Pain Medication Agreement to be scanned into patient's chart.  The risk that can be encountered with breast reduction were discussed and include the following but not limited to these:  Breast asymmetry, fluid accumulation, firmness of the breast, inability to breast feed, loss of nipple or areola, skin loss, decrease or no nipple sensation, fat necrosis of the breast tissue, bleeding, infection, healing delay.  There are risks of anesthesia, changes to skin sensation and injury to nerves or blood vessels.  The muscle can be temporarily or permanently injured.  You may have an allergic reaction to tape, suture, glue, blood products which can result in skin discoloration, swelling, pain, skin lesions, poor healing.  Any of these can lead to the need for revisonal surgery or stage procedures.  A reduction has potential to interfere with diagnostic  procedures.  Nipple or breast piercing can increase risks of infection.  This procedure is best done when the breast is fully developed.  Changes in the breast will continue to occur over time.  Pregnancy can alter the outcomes of previous breast reduction surgery, weight gain and weigh loss can also effect the long term appearance.   Discussed with patient her risk of keloiding is high due to her history of keloids.  Patient is aware and in agreement to proceed with surgery.  We also discussed her increased risks of DVT/PE.  She has a history of an unprovoked PE in 2018, we discussed postop treatment with Lovenox x7 days.  She is comfortable with self injecting.  She is aware of the symptoms of DVT/PE.     Electronically signed by: Kermit Balo Niomie Englert, PA-C 12/18/2020 9:31 AM

## 2020-12-18 NOTE — Progress Notes (Signed)
Patient ID: Alexis Richardson, female    DOB: 15-Sep-2001, 20 y.o.   MRN: [id number]  Chief Complaint  Patient presents with  . Pre-op Exam      ICD-10-CM   1. Macromastia  N62   2. Back pain of thoracolumbar region  M54.50    M54.6   3. Postural kyphosis, thoracic region  M40.04      History of Present Illness: Alexis Richardson is a 20 y.o.  female  with a history of macromastia.  She presents for preoperative evaluation for upcoming procedure, Bilateral Breast Reduction, scheduled for 01/05/2021 with Dr.  Arita Miss  The patient has not had problems with anesthesia.  Personal history of pulmonary embolism. no family history of DVT/PE.  No family or personal history of bleeding or clotting disorders.  Patient is not currently taking any blood thinners.  No history of CVA/MI.   Summary of Previous Visit: Patient is not a smoker or diabetic.  She wants to be around a C cup.  No previous breast biopsies or procedures.  She had a child over a year ago. Anticipate 825 g of breast tissue to be removed from each side.  Job: Student  PMH Significant for: Pulmonary embolism in 2018.  Patient does have a history of protein C, protein S, lupus anticoagulant, factor V Leiden, homocystine serum, prothrombin gene mutation beta-2 glycoprotein and cardiolipin antibody testing.  All results were normal per EMR review in 2019.  patient does have a history of keloiding, this has been treated at a plastic surgery office in [place] previously. She reports that she has had a keloiding of her ear piercings as well as her belly piercing. She previously had her ear and bellybutton keloids excised, her ears have healed well after multiple rounds of steroids.  She is still undergoing treatment for her umbilicus  Past Medical History: Allergies: No Known Allergies  Current Medications:  Current Outpatient Medications:  .  enoxaparin (LOVENOX) 40 MG/0.4ML injection, Inject 0.4 mLs (40 mg total) into the skin daily  for 7 days. Patient to start day after surgery, 01/06/2021, Disp: 2.8 mL, Rfl: 0 .  HYDROcodone-acetaminophen (NORCO) 5-325 MG tablet, Take 1 tablet by mouth every 8 (eight) hours as needed for up to 7 days for severe pain., Disp: 21 tablet, Rfl: 0 .  ondansetron (ZOFRAN) 4 MG tablet, Take 1 tablet (4 mg total) by mouth every 8 (eight) hours as needed for nausea or vomiting., Disp: 20 tablet, Rfl: 0  Past Medical Problems: Past Medical History:  Diagnosis Date  . GBS (group B Streptococcus carrier), +RV culture, currently pregnant 05/01/2019   Treat in labor  . Gestational diabetes   . Gestational diabetes 03/26/2019   Neg postpartum GTT  . Gestational hypertension 05/03/2019  . Gestational hypertension, third trimester 04/13/2019   BL PreE labs Normal 04/12/2019  . Gestational thrombocytopenia (HCC) 05/03/2019  . Pulmonary embolism (HCC)    a. Dx 09/2017, had IUD removed in case this contributed.  . Rh negative state in antepartum period 10/09/2018   [ ]  Rhogam at 28 weeks and postpartum  . Right ventricular dilation    a. at time of PE 09/2017.    Past Surgical History: Past Surgical History:  Procedure Laterality Date  . KELOID EXCISION      Social History: Social History   Socioeconomic History  . Marital status: Single    Spouse name: Not on file  . Number of children: Not on file  . Years of education:  Not on file  . Highest education level: Not on file  Occupational History  . Not on file  Tobacco Use  . Smoking status: Never Smoker  . Smokeless tobacco: Never Used  Vaping Use  . Vaping Use: Never used  Substance and Sexual Activity  . Alcohol use: No  . Drug use: Not Currently    Types: Marijuana    Comment: LAST TIME SMOKED - BEFORE PREG  . Sexual activity: Yes    Birth control/protection: None  Other Topics Concern  . Not on file  Social History Narrative  . Not on file   Social Determinants of Health   Financial Resource Strain: Not on file  Food  Insecurity: Not on file  Transportation Needs: Not on file  Physical Activity: Not on file  Stress: Not on file  Social Connections: Not on file  Intimate Partner Violence: Not on file    Family History: Family History  Problem Relation Age of Onset  . Hypertension Mother   . Hypertension Maternal Aunt   . Cancer Maternal Grandfather   . Pulmonary embolism Neg Hx   . Heart disease Neg Hx     Review of Systems: Review of Systems  Constitutional: Negative.   Respiratory: Negative.   Cardiovascular: Negative.   Gastrointestinal: Negative.   Skin: Negative.   Neurological: Negative.     Physical Exam: Vital Signs BP 115/84 (BP Location: Left Arm, Patient Position: Sitting, Cuff Size: Large)   Pulse 88   Ht 5\' 4"  (1.626 m)   Wt 227 lb (103 kg)   SpO2 100%   BMI 38.96 kg/m  Physical Exam Constitutional:      General: She is not in acute distress.    Appearance: Normal appearance. She is not ill-appearing.  HENT:     Head: Normocephalic and atraumatic.  Eyes:     Pupils: Pupils are equal, round Neck:     Musculoskeletal: Normal range of motion.  Cardiovascular:     Rate and Rhythm: Normal rate and regular rhythm.     Pulses: Normal pulses.     Heart sounds: Normal heart sounds. No murmur.  Pulmonary:     Effort: Pulmonary effort is normal. No respiratory distress.     Breath sounds: Normal breath sounds. No wheezing.  Abdominal:     General: Abdomen is flat. There is no distension.     Palpations: Abdomen is soft.     Tenderness: There is no abdominal tenderness.  Musculoskeletal: Normal range of motion.  Skin:    General: Skin is warm and dry.     Findings: No erythema or rash.  Neurological:     General: No focal deficit present.     Mental Status: She is alert and oriented to person, place, and time. Mental status is at baseline.     Motor: No weakness.  Psychiatric:        Mood and Affect: Mood normal.        Behavior: Behavior normal.     Assessment/Plan: The patient is scheduled for bilateral breast reduction with Dr. .  Risks, benefits, and alternatives of procedure discussed, questions answered and consent obtained.    Smoking Status: Non-smoker; Counseling Given? N/A Last Mammogram: N/A; Results: N/A  Caprini Score: 6, high; Risk Factors include: BMI greater than 25, history of DVT/PE and length of planned surgery. Recommendation for mechanical and pharmacological prophylaxis. Encourage early ambulation.  Plan for 7 days postop of 40 mg of Lovenox daily.  Pictures obtained: Consult  Post-op Rx sent to pharmacy: Norco, Zofran  Patient was provided with the breast reduction and General Surgical Risk consent document and Pain Medication Agreement prior to their appointment.  They had adequate time to read through the risk consent documents and Pain Medication Agreement. We also discussed them in person together during this preop appointment. All of their questions were answered to their satisfaction.  Recommended calling if they have any further questions.  Risk consent form and Pain Medication Agreement to be scanned into patient's chart.  The risk that can be encountered with breast reduction were discussed and include the following but not limited to these:  Breast asymmetry, fluid accumulation, firmness of the breast, inability to breast feed, loss of nipple or areola, skin loss, decrease or no nipple sensation, fat necrosis of the breast tissue, bleeding, infection, healing delay.  There are risks of anesthesia, changes to skin sensation and injury to nerves or blood vessels.  The muscle can be temporarily or permanently injured.  You may have an allergic reaction to tape, suture, glue, blood products which can result in skin discoloration, swelling, pain, skin lesions, poor healing.  Any of these can lead to the need for revisonal surgery or stage procedures.  A reduction has potential to interfere with diagnostic  procedures.  Nipple or breast piercing can increase risks of infection.  This procedure is best done when the breast is fully developed.  Changes in the breast will continue to occur over time.  Pregnancy can alter the outcomes of previous breast reduction surgery, weight gain and weigh loss can also effect the long term appearance.   Discussed with patient her risk of keloiding is high due to her history of keloids.  Patient is aware and in agreement to proceed with surgery.  We also discussed her increased risks of DVT/PE.  She has a history of an unprovoked PE in 2018, we discussed postop treatment with Lovenox x7 days.  She is comfortable with self injecting.  She is aware of the symptoms of DVT/PE.     Electronically signed by: Kermit Balo Jennfer Gassen, PA-C 12/18/2020 9:31 AM

## 2020-12-19 ENCOUNTER — Other Ambulatory Visit: Payer: Self-pay | Admitting: Surgical

## 2020-12-19 MED ORDER — HYDROCODONE-ACETAMINOPHEN 5-325 MG PO TABS
1.0000 | ORAL_TABLET | Freq: Four times a day (QID) | ORAL | 0 refills | Status: AC | PRN
Start: 2020-12-19 — End: 2020-12-24

## 2020-12-19 NOTE — Progress Notes (Signed)
Postop pain medications changed from 7 days to 5 days.  Discontinued previous medications and confirm patient has not picked up by using [place] PDMP

## 2020-12-29 ENCOUNTER — Other Ambulatory Visit: Payer: Self-pay

## 2020-12-29 ENCOUNTER — Encounter (HOSPITAL_BASED_OUTPATIENT_CLINIC_OR_DEPARTMENT_OTHER): Payer: Self-pay | Admitting: Plastic Surgery

## 2020-12-31 ENCOUNTER — Other Ambulatory Visit: Payer: Self-pay | Admitting: Surgical

## 2021-01-02 ENCOUNTER — Other Ambulatory Visit (HOSPITAL_COMMUNITY)
Admission: RE | Admit: 2021-01-02 | Discharge: 2021-01-02 | Disposition: A | Discharge status: Home / Self Care | Payer: Medicaid Other | Source: Ambulatory Visit | Attending: Plastic Surgery | Admitting: Plastic Surgery

## 2021-01-02 DIAGNOSIS — Z20822 Contact with and (suspected) exposure to covid-19: Secondary | ICD-10-CM | POA: Diagnosis not present

## 2021-01-02 DIAGNOSIS — Z01812 Encounter for preprocedural laboratory examination: Secondary | ICD-10-CM | POA: Diagnosis present

## 2021-01-02 LAB — SARS CORONAVIRUS 2 (TAT 6-24 HRS): SARS Coronavirus 2: NEGATIVE

## 2021-01-05 ENCOUNTER — Encounter (HOSPITAL_BASED_OUTPATIENT_CLINIC_OR_DEPARTMENT_OTHER): Payer: Self-pay | Admitting: Plastic Surgery

## 2021-01-05 ENCOUNTER — Encounter (HOSPITAL_BASED_OUTPATIENT_CLINIC_OR_DEPARTMENT_OTHER)
Admission: RE | Disposition: A | Discharge status: Home / Self Care | Payer: Self-pay | Source: Home / Self Care | Attending: Plastic Surgery

## 2021-01-05 ENCOUNTER — Ambulatory Visit (HOSPITAL_BASED_OUTPATIENT_CLINIC_OR_DEPARTMENT_OTHER)
Admission: RE | Admit: 2021-01-05 | Discharge: 2021-01-05 | Disposition: A | Discharge status: Home / Self Care | Payer: Medicaid Other | Attending: Plastic Surgery | Admitting: Plastic Surgery

## 2021-01-05 ENCOUNTER — Other Ambulatory Visit: Payer: Self-pay

## 2021-01-05 ENCOUNTER — Ambulatory Visit (HOSPITAL_BASED_OUTPATIENT_CLINIC_OR_DEPARTMENT_OTHER): Payer: Medicaid Other | Admitting: Anesthesiology

## 2021-01-05 DIAGNOSIS — N62 Hypertrophy of breast: Secondary | ICD-10-CM | POA: Insufficient documentation

## 2021-01-05 DIAGNOSIS — D6862 Lupus anticoagulant syndrome: Secondary | ICD-10-CM | POA: Diagnosis not present

## 2021-01-05 DIAGNOSIS — Z86711 Personal history of pulmonary embolism: Secondary | ICD-10-CM | POA: Insufficient documentation

## 2021-01-05 DIAGNOSIS — Z7901 Long term (current) use of anticoagulants: Secondary | ICD-10-CM | POA: Insufficient documentation

## 2021-01-05 HISTORY — PX: BREAST REDUCTION SURGERY: SHX8

## 2021-01-05 LAB — POCT PREGNANCY, URINE: Preg Test, Ur: NEGATIVE

## 2021-01-05 SURGERY — MAMMOPLASTY, REDUCTION
Anesthesia: General | Site: Breast | Laterality: Bilateral

## 2021-01-05 MED ORDER — PROPOFOL 10 MG/ML IV BOLUS
INTRAVENOUS | Status: AC
  Filled 2021-01-05: qty 20

## 2021-01-05 MED ORDER — 0.9 % SODIUM CHLORIDE (POUR BTL) OPTIME
TOPICAL | Status: DC | PRN
  Administered 2021-01-05: 1000 mL

## 2021-01-05 MED ORDER — PROPOFOL 500 MG/50ML IV EMUL
INTRAVENOUS | Status: DC | PRN
Start: 2021-01-05 — End: 2021-01-05
  Administered 2021-01-05: 35 ug/kg/min via INTRAVENOUS

## 2021-01-05 MED ORDER — PROMETHAZINE HCL 25 MG/ML IJ SOLN
6.2500 mg | INTRAMUSCULAR | Status: DC | PRN
Start: 2021-01-05 — End: 2021-01-05

## 2021-01-05 MED ORDER — CEFAZOLIN SODIUM-DEXTROSE 2-4 GM/100ML-% IV SOLN
INTRAVENOUS | Status: AC
  Filled 2021-01-05: qty 100

## 2021-01-05 MED ORDER — ROCURONIUM BROMIDE 100 MG/10ML IV SOLN
INTRAVENOUS | Status: DC | PRN
  Administered 2021-01-05: 70 mg via INTRAVENOUS

## 2021-01-05 MED ORDER — CEFAZOLIN SODIUM-DEXTROSE 2-4 GM/100ML-% IV SOLN
2.0000 g | INTRAVENOUS | Status: AC
  Administered 2021-01-05: 2 g via INTRAVENOUS

## 2021-01-05 MED ORDER — CHLORHEXIDINE GLUCONATE CLOTH 2 % EX PADS: 6.0000 | MEDICATED_PAD | Freq: Once | CUTANEOUS | Status: DC

## 2021-01-05 MED ORDER — DEXAMETHASONE SODIUM PHOSPHATE 4 MG/ML IJ SOLN
INTRAMUSCULAR | Status: DC | PRN
  Administered 2021-01-05: 10 mg via INTRAVENOUS

## 2021-01-05 MED ORDER — ACETAMINOPHEN 500 MG PO TABS
ORAL_TABLET | ORAL | Status: AC
  Filled 2021-01-05: qty 2

## 2021-01-05 MED ORDER — SCOPOLAMINE 1 MG/3DAYS TD PT72
MEDICATED_PATCH | TRANSDERMAL | Status: AC
  Filled 2021-01-05: qty 1

## 2021-01-05 MED ORDER — FENTANYL CITRATE (PF) 100 MCG/2ML IJ SOLN
25.0000 ug | INTRAMUSCULAR | Status: DC | PRN
Start: 2021-01-05 — End: 2021-01-05
  Administered 2021-01-05 (×2): 50 ug via INTRAVENOUS

## 2021-01-05 MED ORDER — ACETAMINOPHEN 500 MG PO TABS
1000.0000 mg | ORAL_TABLET | Freq: Once | ORAL | Status: AC
  Administered 2021-01-05: 1000 mg via ORAL

## 2021-01-05 MED ORDER — MIDAZOLAM HCL 2 MG/2ML IJ SOLN
INTRAMUSCULAR | Status: AC
  Filled 2021-01-05: qty 2

## 2021-01-05 MED ORDER — FENTANYL CITRATE (PF) 100 MCG/2ML IJ SOLN
INTRAMUSCULAR | Status: DC | PRN
  Administered 2021-01-05 (×3): 50 ug via INTRAVENOUS
  Administered 2021-01-05: 100 ug via INTRAVENOUS
  Administered 2021-01-05: 50 ug via INTRAVENOUS

## 2021-01-05 MED ORDER — LIDOCAINE HCL (CARDIAC) PF 100 MG/5ML IV SOSY
PREFILLED_SYRINGE | INTRAVENOUS | Status: DC | PRN
  Administered 2021-01-05: 60 mg via INTRAVENOUS

## 2021-01-05 MED ORDER — FENTANYL CITRATE (PF) 100 MCG/2ML IJ SOLN
INTRAMUSCULAR | Status: AC
  Filled 2021-01-05: qty 2

## 2021-01-05 MED ORDER — PROPOFOL 10 MG/ML IV BOLUS
INTRAVENOUS | Status: DC | PRN
  Administered 2021-01-05: 200 mg via INTRAVENOUS

## 2021-01-05 MED ORDER — PHENYLEPHRINE 40 MCG/ML (10ML) SYRINGE FOR IV PUSH (FOR BLOOD PRESSURE SUPPORT)
PREFILLED_SYRINGE | INTRAVENOUS | Status: AC
  Filled 2021-01-05: qty 20

## 2021-01-05 MED ORDER — MIDAZOLAM HCL 5 MG/5ML IJ SOLN
INTRAMUSCULAR | Status: DC | PRN
  Administered 2021-01-05: 2 mg via INTRAVENOUS

## 2021-01-05 MED ORDER — SCOPOLAMINE 1 MG/3DAYS TD PT72
1.0000 | MEDICATED_PATCH | TRANSDERMAL | Status: DC
  Administered 2021-01-05: 1.5 mg via TRANSDERMAL

## 2021-01-05 MED ORDER — OXYCODONE HCL 5 MG PO TABS
5.0000 mg | ORAL_TABLET | Freq: Once | ORAL | Status: AC | PRN
  Administered 2021-01-05: 5 mg via ORAL

## 2021-01-05 MED ORDER — AMISULPRIDE (ANTIEMETIC) 5 MG/2ML IV SOLN: 10.0000 mg | Freq: Once | INTRAVENOUS | Status: DC | PRN

## 2021-01-05 MED ORDER — ONDANSETRON HCL 4 MG/2ML IJ SOLN
INTRAMUSCULAR | Status: DC | PRN
  Administered 2021-01-05: 4 mg via INTRAVENOUS

## 2021-01-05 MED ORDER — SUGAMMADEX SODIUM 200 MG/2ML IV SOLN
INTRAVENOUS | Status: DC | PRN
Start: 2021-01-05 — End: 2021-01-05
  Administered 2021-01-05: 200 mg via INTRAVENOUS

## 2021-01-05 MED ORDER — OXYCODONE HCL 5 MG PO TABS
ORAL_TABLET | ORAL | Status: AC
  Filled 2021-01-05: qty 1

## 2021-01-05 MED ORDER — LACTATED RINGERS IV SOLN
INTRAVENOUS | Status: DC | PRN
  Administered 2021-01-05: 1500 mL

## 2021-01-05 MED ORDER — OXYCODONE HCL 5 MG/5ML PO SOLN: 5.0000 mg | Freq: Once | ORAL | Status: AC | PRN

## 2021-01-05 MED ORDER — LACTATED RINGERS IV SOLN: INTRAVENOUS | Status: DC

## 2021-01-05 SURGICAL SUPPLY — 68 items
APL PRP STRL LF DISP 70% ISPRP (MISCELLANEOUS) ×2
APL SKNCLS STERI-STRIP NONHPOA (GAUZE/BANDAGES/DRESSINGS) ×2
BAG DECANTER FOR FLEXI CONT (MISCELLANEOUS) IMPLANT
BENZOIN TINCTURE PRP APPL 2/3 (GAUZE/BANDAGES/DRESSINGS) ×4 IMPLANT
BLADE SURG 10 STRL SS (BLADE) ×4 IMPLANT
BLADE SURG 15 STRL LF DISP TIS (BLADE) IMPLANT
BLADE SURG 15 STRL SS (BLADE)
BNDG ELASTIC 6X5.8 VLCR STR LF (GAUZE/BANDAGES/DRESSINGS) ×4 IMPLANT
CANISTER SUCT 1200ML W/VALVE (MISCELLANEOUS) ×2 IMPLANT
CHLORAPREP W/TINT 26 (MISCELLANEOUS) ×4 IMPLANT
CLIP VESOCCLUDE MED 6/CT (CLIP) IMPLANT
COVER BACK TABLE 60X90IN (DRAPES) ×2 IMPLANT
COVER MAYO STAND STRL (DRAPES) ×2 IMPLANT
COVER WAND RF STERILE (DRAPES) IMPLANT
DECANTER SPIKE VIAL GLASS SM (MISCELLANEOUS) IMPLANT
DRAIN CHANNEL 15F RND FF W/TCR (WOUND CARE) IMPLANT
DRAIN PENROSE 1/2X12 LTX STRL (WOUND CARE) IMPLANT
DRAPE LAPAROSCOPIC ABDOMINAL (DRAPES) ×2 IMPLANT
DRAPE UTILITY XL STRL (DRAPES) ×2 IMPLANT
DRSG PAD ABDOMINAL 8X10 ST (GAUZE/BANDAGES/DRESSINGS) ×4 IMPLANT
ELECT REM PT RETURN 9FT ADLT (ELECTROSURGICAL) ×2
ELECTRODE REM PT RTRN 9FT ADLT (ELECTROSURGICAL) ×1 IMPLANT
EVACUATOR SILICONE 100CC (DRAIN) IMPLANT
GAUZE SPONGE 4X4 12PLY STRL (GAUZE/BANDAGES/DRESSINGS) ×4 IMPLANT
GAUZE XEROFORM 5X9 LF (GAUZE/BANDAGES/DRESSINGS) IMPLANT
GLOVE BIO SURGEON STRL SZ7.5 (GLOVE) IMPLANT
GLOVE ECLIPSE 6.5 STRL STRAW (GLOVE) IMPLANT
GLOVE SRG 8 PF TXTR STRL LF DI (GLOVE) IMPLANT
GLOVE SURG ENC MOIS LTX SZ6.5 (GLOVE) IMPLANT
GLOVE SURG ENC TEXT LTX SZ7.5 (GLOVE) ×2 IMPLANT
GLOVE SURG UNDER POLY LF SZ8 (GLOVE)
GOWN STRL REUS W/ TWL LRG LVL3 (GOWN DISPOSABLE) ×3 IMPLANT
GOWN STRL REUS W/TWL LRG LVL3 (GOWN DISPOSABLE) ×6
MARKER SKIN DUAL TIP RULER LAB (MISCELLANEOUS) IMPLANT
NDL SAFETY ECLIPSE 18X1.5 (NEEDLE) ×1 IMPLANT
NEEDLE FILTER BLUNT 18X 1/2SAF (NEEDLE) ×1
NEEDLE FILTER BLUNT 18X1 1/2 (NEEDLE) ×1 IMPLANT
NEEDLE HYPO 18GX1.5 SHARP (NEEDLE) ×2
NEEDLE HYPO 25X1 1.5 SAFETY (NEEDLE) IMPLANT
NEEDLE SPNL 18GX3.5 QUINCKE PK (NEEDLE) ×2 IMPLANT
NS IRRIG 1000ML POUR BTL (IV SOLUTION) ×2 IMPLANT
PACK BASIN DAY SURGERY FS (CUSTOM PROCEDURE TRAY) ×2 IMPLANT
PENCIL SMOKE EVACUATOR (MISCELLANEOUS) ×2 IMPLANT
PIN SAFETY STERILE (MISCELLANEOUS) IMPLANT
SHEET MEDIUM DRAPE 40X70 STRL (DRAPES) IMPLANT
SLEEVE SCD COMPRESS KNEE MED (MISCELLANEOUS) ×2 IMPLANT
SPONGE LAP 18X18 RF (DISPOSABLE) ×6 IMPLANT
STAPLER INSORB 30 2030 C-SECTI (MISCELLANEOUS) ×2 IMPLANT
STAPLER VISISTAT 35W (STAPLE) ×2 IMPLANT
STRIP SUTURE WOUND CLOSURE 1/2 (MISCELLANEOUS) ×6 IMPLANT
SUT CHROMIC 4 0 PS 2 18 (SUTURE) IMPLANT
SUT ETHILON 2 0 FS 18 (SUTURE) IMPLANT
SUT ETHILON 3 0 PS 1 (SUTURE) IMPLANT
SUT MNCRL AB 4-0 PS2 18 (SUTURE) ×6 IMPLANT
SUT PDS 3-0 CT2 (SUTURE) ×6
SUT PDS II 3-0 CT2 27 ABS (SUTURE) ×3 IMPLANT
SUT VIC AB 3-0 PS1 18 (SUTURE)
SUT VIC AB 3-0 PS1 18XBRD (SUTURE) IMPLANT
SUT VLOC 180 P-14 24 (SUTURE) ×4 IMPLANT
SYR 50ML LL SCALE MARK (SYRINGE) IMPLANT
SYR BULB IRRIG 60ML STRL (SYRINGE) ×2 IMPLANT
SYR CONTROL 10ML LL (SYRINGE) IMPLANT
TAPE MEASURE VINYL STERILE (MISCELLANEOUS) IMPLANT
TOWEL GREEN STERILE FF (TOWEL DISPOSABLE) ×4 IMPLANT
TUBE CONNECTING 20X1/4 (TUBING) ×2 IMPLANT
TUBING INFILTRATION IT-10001 (TUBING) ×2 IMPLANT
UNDERPAD 30X36 HEAVY ABSORB (UNDERPADS AND DIAPERS) ×4 IMPLANT
YANKAUER SUCT BULB TIP NO VENT (SUCTIONS) ×2 IMPLANT

## 2021-01-05 NOTE — Op Note (Signed)
Operative Note   DATE OF OPERATION: 01/05/2021  LOCATION: [facility]   SURGICAL DEPARTMENT: Plastic Surgery  PREOPERATIVE DIAGNOSES: Bilateral symptomatic macromastia.  POSTOPERATIVE DIAGNOSES:  same  PROCEDURE: Bilateral breast reduction with superomedial pedicle.  SURGEON: Ancil Linsey, MD  ASSISTANT: Zadie Cleverly, PA The advanced practice practitioner (APP) assisted throughout the case.  The APP was essential in retraction and counter traction when needed to make the case progress smoothly.  This retraction and assistance made it possible to see the tissue plans for the procedure.  The assistance was needed for blood control, tissue re-approximation and assisted with closure of the incision site.  ANESTHESIA: General.  COMPLICATIONS: None.   INDICATIONS FOR PROCEDURE:  The patient, Alexis Richardson is a 20 y.o. female born on [birth year], is here for treatment of bilateral symptomatic macromastia. MRN: [id number]  CONSENT:  Informed consent was obtained directly from the patient. Risks, benefits and alternatives were fully discussed. Specific risks including but not limited to bleeding, infection, hematoma, seroma, scarring, pain, infection, contracture, asymmetry, wound healing problems, and need for further surgery were all discussed. The patient did have an ample opportunity to have questions answered to satisfaction.   DESCRIPTION OF PROCEDURE:  The patient was marked preoperatively for a Wise pattern skin excision.  The patient was taken to the operating room. SCDs were placed and antibiotics were given. General anesthesia was administered.The patient's operative site was prepped and draped in a sterile fashion. A time out was performed and all information was confirmed to be correct.  Right Breast: The breast was infiltrated with tumescent solution to help with hemostasis.  The nipple was marked with a cookie cutter.  A superomedial pedicle was drawn out  with the base of at least 8 cm in size.  A breast tourniquet was then applied and the pedicle was de-epithelialized.  Breast tourniquet was then let down and all incisions were made with a 10 blade.  The pedicle was then isolated down to the chest wall with cautery and the excision was performed removing tissue primarily inferiorly and laterally.  Hemostasis was obtained and the wound was stapled closed.  Left breast:  The breast was infiltrated with tumescent solution to help with hemostasis.  The nipple was marked with a cookie cutter.  A superomedial pedicle was drawn out with the base of at least 8 cm in size.  A breast tourniquet was then applied and the pedicle was de-epithelialized.  Breast tourniquet was then let down and all incisions were made with a 10 blade.  The pedicle was then isolated down to the chest wall with cautery and the excision was performed removing tissue primarily inferiorly and laterally.  Hemostasis was obtained and the wound was stapled closed.  Patient was then set up to check for size and symmetry.  Minor modifications were made.  This resulted in a total of 936g removed from the right side and 1043g removed from the left side.  The inframammary incision was closed with a combination of buried in-sorb staples and a running 3-0 Quill suture.  The vertical and periareolar limbs were closed with interrupted buried 4-0 Monocryl and a running 4-0 Quill suture.  Steri-Strips were then applied along with a soft dressing and Ace wrap.  The patient tolerated the procedure well.  There were no complications. The patient was allowed to wake from anesthesia, extubated and taken to the recovery room in satisfactory condition.  I was present for the entire procedure.

## 2021-01-05 NOTE — Brief Op Note (Signed)
01/05/2021  12:20 PM  PATIENT:  Alexis Richardson  20 y.o. female  PRE-OPERATIVE DIAGNOSIS:  MACROMASTIA  POST-OPERATIVE DIAGNOSIS:  MACROMASTIA  PROCEDURE:  Procedure(s): BILATERAL MAMMARY REDUCTION (Bilateral)  SURGEON:  Surgeon(s) and Role:    * Macarthur Lorusso, Wendy Poet, MD - Primary  PHYSICIAN ASSISTANT: Materials engineer, PA  ASSISTANTS: none   ANESTHESIA:   general  EBL:  50   BLOOD ADMINISTERED:none  DRAINS: none   LOCAL MEDICATIONS USED:  MARCAINE     SPECIMEN:  Source of Specimen:  r and l breast tissue  DISPOSITION OF SPECIMEN:  PATHOLOGY  COUNTS:  YES  TOURNIQUET:  * No tourniquets in log *  DICTATION: .Dragon Dictation  PLAN OF CARE: Discharge to home after PACU  PATIENT DISPOSITION:  PACU - hemodynamically stable.   Delay start of Pharmacological VTE agent (>24hrs) due to surgical blood loss or risk of bleeding: not applicable

## 2021-01-05 NOTE — Anesthesia Procedure Notes (Signed)
Procedure Name: Intubation Date/Time: 01/05/2021 10:32 AM Performed by: Signe Colt, CRNA Pre-anesthesia Checklist: Patient identified, Emergency Drugs available, Suction available and Patient being monitored Patient Re-evaluated:Patient Re-evaluated prior to induction Oxygen Delivery Method: Circle system utilized Preoxygenation: Pre-oxygenation with 100% oxygen Induction Type: IV induction Ventilation: Mask ventilation without difficulty Laryngoscope Size: Mac and 3 Grade View: Grade I Tube type: Oral Tube size: 7.0 mm Number of attempts: 1 Airway Equipment and Method: Stylet and Oral airway Placement Confirmation: ETT inserted through vocal cords under direct vision,  positive ETCO2 and breath sounds checked- equal and bilateral Secured at: 21 cm Tube secured with: Tape Dental Injury: Teeth and Oropharynx as per pre-operative assessment

## 2021-01-05 NOTE — Anesthesia Postprocedure Evaluation (Signed)
Anesthesia Post Note  Patient: Alexis Richardson  Procedure(s) Performed: BILATERAL MAMMARY REDUCTION (Bilateral Breast)     Patient location during evaluation: PACU Anesthesia Type: General Level of consciousness: awake Pain management: pain level controlled Vital Signs Assessment: post-procedure vital signs reviewed and stable Respiratory status: spontaneous breathing and respiratory function stable Cardiovascular status: stable Postop Assessment: no apparent nausea or vomiting Anesthetic complications: no   No complications documented.  Last Vitals:  Vitals:   01/05/21 1315 01/05/21 1327  BP: (!) 142/104 (!) 139/93  Pulse: 78   Resp: 19 18  Temp:  36.6 C  SpO2: 100% 100%    Last Pain:  Vitals:   01/05/21 1345  TempSrc:   PainSc: 4                  Candra R Shaasia Odle

## 2021-01-05 NOTE — Interval H&P Note (Signed)
Patient seen an examined. Proceed with surgery.

## 2021-01-05 NOTE — Discharge Instructions (Signed)
Activity As tolerated: NO showers for 3 days No heavy activities  Diet: Regular  Wound Care: Keep dressing clean & dry for 3 days.  Keep wrap applied with compression as much as possible.  After showering, re-wrap your breasts.  START LOVENOX TOMORROW (01/06/21) morning and do for 7 days total  Do not change dressings for 3 days unless soiled.  Can change if needed but make sure to reapply wrap. After three days can remove wrap and shower.  Then reapply dressings if needed and continue compression with wrap or soft sports bra. Call doctor if any unusual problems occur such as pain, excessive bleeding, unrelieved nausea/vomiting, fever &/or chills  Follow-up appointment: Scheduled for next week.  Post Anesthesia Home Care Instructions  Activity: Get plenty of rest for the remainder of the day. A responsible individual must stay with you for 24 hours following the procedure.  For the next 24 hours, DO NOT: -Drive a car -Advertising copywriter -Drink alcoholic beverages -Take any medication unless instructed by your physician -Make any legal decisions or sign important papers.  Meals: Start with liquid foods such as gelatin or soup. Progress to regular foods as tolerated. Avoid greasy, spicy, heavy foods. If nausea and/or vomiting occur, drink only clear liquids until the nausea and/or vomiting subsides. Call your physician if vomiting continues.  Special Instructions/Symptoms: Your throat may feel dry or sore from the anesthesia or the breathing tube placed in your throat during surgery. If this causes discomfort, gargle with warm salt water. The discomfort should disappear within 24 hours.  If you had a scopolamine patch placed behind your ear for the management of post- operative nausea and/or vomiting:  1. The medication in the patch is effective for 72 hours, after which it should be removed.  Wrap patch in a tissue and discard in the trash. Wash hands thoroughly with soap and  water. 2. You may remove the patch earlier than 72 hours if you experience unpleasant side effects which may include dry mouth, dizziness or visual disturbances. 3. Avoid touching the patch. Wash your hands with soap and water after contact with the patch.  Next Tylenol dose due at 3:00pm.

## 2021-01-05 NOTE — Transfer of Care (Signed)
Immediate Anesthesia Transfer of Care Note  Patient: [name]  Procedure(s) Performed: BILATERAL MAMMARY REDUCTION (Bilateral Breast)  Patient Location: PACU  Anesthesia Type:General  Level of Consciousness: awake, alert , oriented and patient cooperative  Airway & Oxygen Therapy: Patient Spontanous Breathing and Patient connected to face mask oxygen  Post-op Assessment: Report given to RN and Post -op Vital signs reviewed and stable  Post vital signs: Reviewed and stable  Last Vitals:  Vitals Value Taken Time  BP    Temp    Pulse 81 01/05/21 1216  Resp    SpO2 100 % 01/05/21 1216  Vitals shown include unvalidated device data.  Last Pain:  Vitals:   01/05/21 0858  TempSrc: Oral  PainSc: 0-No pain      Patients Stated Pain Goal: 5 (01/05/21 0858)  Complications: No complications documented.

## 2021-01-05 NOTE — Anesthesia Preprocedure Evaluation (Addendum)
Anesthesia Evaluation  Patient identified by MRN, date of birth, ID band Patient awake    Reviewed: Allergy & Precautions, H&P , NPO status , Patient's Chart, lab work & pertinent test results  Airway Mallampati: II  TM Distance: >3 FB Neck ROM: Full    Dental no notable dental hx. (+) Teeth Intact   Pulmonary neg pulmonary ROS,    Pulmonary exam normal breath sounds clear to auscultation       Cardiovascular hypertension, Normal cardiovascular exam Rhythm:Regular Rate:Normal  H/o PE in 2018   Neuro/Psych negative neurological ROS  negative psych ROS   GI/Hepatic negative GI ROS, Neg liver ROS,   Endo/Other  diabetesObesity (BMI 39)  Renal/GU negative Renal ROS  negative genitourinary   Musculoskeletal negative musculoskeletal ROS (+)   Abdominal   Peds negative pediatric ROS (+)  Hematology negative hematology ROS (+)   Anesthesia Other Findings   Reproductive/Obstetrics negative OB ROS                           Anesthesia Physical Anesthesia Plan  ASA: II  Anesthesia Plan: General   Post-op Pain Management:    Induction: Intravenous  PONV Risk Score and Plan: 4 or greater and Scopolamine patch - Pre-op, Midazolam, Dexamethasone, Ondansetron and Treatment may vary due to age or medical condition  Airway Management Planned: Oral ETT  Additional Equipment:   Intra-op Plan:   Post-operative Plan: Extubation in OR  Informed Consent: I have reviewed the patients History and Physical, chart, labs and discussed the procedure including the risks, benefits and alternatives for the proposed anesthesia with the patient or authorized representative who has indicated his/her understanding and acceptance.     Dental advisory given  Plan Discussed with: CRNA and Anesthesiologist  Anesthesia Plan Comments:        Anesthesia Quick Evaluation

## 2021-01-06 ENCOUNTER — Encounter (HOSPITAL_BASED_OUTPATIENT_CLINIC_OR_DEPARTMENT_OTHER): Payer: Self-pay | Admitting: Plastic Surgery

## 2021-01-06 ENCOUNTER — Encounter: Payer: Self-pay | Admitting: Surgical

## 2021-01-06 LAB — SURGICAL PATHOLOGY

## 2021-01-09 ENCOUNTER — Telehealth: Payer: Self-pay

## 2021-01-09 NOTE — Telephone Encounter (Signed)
Returned patients call. Under the advise of Loistine Simas, yes she can go into a front zipper sports bra since it has been past 3 days of surgery. The cough and sore throat is common after from being under annesthesia. Patient also mentioned she felt bloated. She has been having bowel movements daily. Suggested for her to try a stool softener to make sure she is getting rid of all waste and stay away from any foods that could make her gassy feeling. Should anything changed prior to her follow up appointment on 01/14/21, call our office. Patient understood and agreed.

## 2021-01-09 NOTE — Telephone Encounter (Signed)
Patient called to find out if she can exchange her bandage for a zip-up sports bra.  Also, she said that she developed a cough right after the surgery and her throat is sore.  She said that her cough is becoming more persistent.  Patient would like to know if this is normal and possibly related to the tube that was down her throat during the surgery.  Please call.

## 2021-01-13 ENCOUNTER — Telehealth: Payer: Self-pay | Admitting: Plastic Surgery

## 2021-01-13 NOTE — Telephone Encounter (Signed)
Advised patient to leave steri strips alone and wait for Dr. Arita Miss to observe at her PO tomorrow. It is common to have some odor coming from this area due to sweating and taking showers. Patient understood and agreed.

## 2021-01-13 NOTE — Telephone Encounter (Signed)
Patient called to ask what she can use on the itch. Both breasts are itching and she said it's the entire breast, not just one particular area. Please call her to advise what she can use to soothe the itch. [phone].

## 2021-01-13 NOTE — Telephone Encounter (Signed)
Patient said that there is some moisture under her steri strips and she had it smells. She isn't sure if that is normal and wanted to know when she can take the steri strips off? She isn't having pain or swelling. Please call to advise. [phone]

## 2021-01-13 NOTE — Telephone Encounter (Signed)
Returned patients call. Both breast are itching under the steri strips and around the areolas. She has not been taking hydrocodone in 3 days, and not on any ABX. She indicated she did wake up yesterday sweating under her breast. Advised her to leave steri-strips alone, they will come off on their own. She apply Vaseline to the areolas, take Allegra during the day and Benadryl at night for the itching. We will see her tomorrow for her PO. Patient understood and agreed.

## 2021-01-14 ENCOUNTER — Other Ambulatory Visit: Payer: Self-pay

## 2021-01-14 ENCOUNTER — Encounter: Payer: Self-pay | Admitting: Plastic Surgery

## 2021-01-14 ENCOUNTER — Ambulatory Visit (INDEPENDENT_AMBULATORY_CARE_PROVIDER_SITE_OTHER): Payer: Medicaid Other | Admitting: Plastic Surgery

## 2021-01-14 VITALS — BP 135/88 | HR 109

## 2021-01-14 DIAGNOSIS — N62 Hypertrophy of breast: Secondary | ICD-10-CM

## 2021-01-14 MED ORDER — HYDROXYZINE HCL 10 MG PO TABS: 10.0000 mg | ORAL_TABLET | Freq: Three times a day (TID) | ORAL | 0 refills | Status: DC | PRN

## 2021-01-14 MED ORDER — TRIAMCINOLONE ACETONIDE 0.1 % EX CREA: 1.0000 "application " | TOPICAL_CREAM | Freq: Two times a day (BID) | CUTANEOUS | 0 refills | Status: DC

## 2021-01-14 NOTE — Progress Notes (Signed)
Patient presents 1 week postop from bilateral breast reduction.  Overall she feels like things are going reasonably well.  She has had fairly severe itching.  She is transitioned out of her wrap and into a softer bra.  On exam all the incisions look to be healing well.  Nipple areolar complexes are viable.  There is no signs of skin compromise.  There is some mild erythema around the nipple areolar complex.  Due to the severity of her itching I did remove her Steri-Strips in the event that she was being affected by the adhesive.  Also prescribed Atarax and steroid cream that she can apply to the most symptomatic areas.  Otherwise things are looking good on exam but no subcutaneous fluid collections.  We will plan to see her at her next appointment next week.

## 2021-01-21 ENCOUNTER — Encounter: Payer: Self-pay | Admitting: Surgical

## 2021-01-21 ENCOUNTER — Ambulatory Visit (INDEPENDENT_AMBULATORY_CARE_PROVIDER_SITE_OTHER): Payer: Medicaid Other | Admitting: Surgical

## 2021-01-21 ENCOUNTER — Other Ambulatory Visit: Payer: Self-pay

## 2021-01-21 VITALS — BP 127/76 | HR 81

## 2021-01-21 DIAGNOSIS — N62 Hypertrophy of breast: Secondary | ICD-10-CM

## 2021-01-21 DIAGNOSIS — Z9889 Other specified postprocedural states: Secondary | ICD-10-CM

## 2021-01-21 NOTE — Progress Notes (Signed)
Patient is a 20 year old female here for follow-up after bilateral breast reduction on 01/05/2021 with Dr. Arita Miss.  At her initial postop appointment she had a rash, she reports after removal of the Steri-Strips she has significant improvement.  She reports she is doing well today.  She has not had any infectious symptoms.  She reports itching/rash has improved.  On exam all the incisions look to be healing well, bilateral NAC's are viable. Erythema has resolved.  Recommend starting silicone scar cream in 2 weeks to decrease risk of keloid/hypertrophic scarring as she has a history of this.  She has an appointment in 1 week with Dr. Arita Miss for keloid injections into her abdominal incision.  I recommend she follow-up in 4 weeks for reevaluation.  I recommend she call with any questions or concerns prior to her appointment.  We discussed continue to avoid the strenuous activities or heavy lifting for 4 more weeks.  Recommend continuing to wear compressive garment 24/7 for 4 more weeks.  There is no sign of infection, seroma, hematoma.

## 2021-01-28 ENCOUNTER — Institutional Professional Consult (permissible substitution): Payer: Medicaid Other | Admitting: Plastic Surgery

## 2021-02-19 ENCOUNTER — Telehealth: Payer: Self-pay

## 2021-02-19 ENCOUNTER — Ambulatory Visit: Payer: Medicaid Other | Admitting: Plastic Surgery

## 2021-02-19 NOTE — Telephone Encounter (Signed)
Patient said she had what appeared to be clear stitches hanging out of her scar area.  Patient said it was bothering her, so she cut it shorter.  She would like to know if this is normal.  Please call.

## 2021-02-20 NOTE — Telephone Encounter (Signed)
Returned patients call. LMVM, advised it is normal for stitches to work their way out the incision and are dissolvable. Her follow up appointment is not until 04/02/21 with Dr. Arita Miss. If she feels comfortable clipping with small clippers, she may trim them but not to the skin.  If not, cover with Vaseline and gauze until her next follow up to prevent any irritation or rubbing on clothes/skin. If she has any questions, call our office.

## 2021-04-02 ENCOUNTER — Ambulatory Visit (INDEPENDENT_AMBULATORY_CARE_PROVIDER_SITE_OTHER): Payer: Medicaid Other | Admitting: Plastic Surgery

## 2021-04-02 ENCOUNTER — Other Ambulatory Visit: Payer: Self-pay

## 2021-04-02 ENCOUNTER — Institutional Professional Consult (permissible substitution): Payer: Medicaid Other | Admitting: Plastic Surgery

## 2021-04-02 DIAGNOSIS — L910 Hypertrophic scar: Secondary | ICD-10-CM

## 2021-04-02 DIAGNOSIS — N62 Hypertrophy of breast: Secondary | ICD-10-CM

## 2021-04-02 NOTE — Progress Notes (Signed)
   Referring Provider Fleet Contras, MD [address] [place],  [place] [address]   CC:  Chief Complaint  Patient presents with  . consult      Alexis Richardson is an 20 y.o. female.  HPI: Patient presents about 4 months out from her bilateral breast reduction.  Overall she is happy with the result.  She points out the scars laterally on both sides have widened a little bit.  She also has some keloids around her umbilicus that she is interested in having injected today.  Review of Systems General: Denies fevers and chills  Physical Exam Vitals with BMI 01/21/2021 01/14/2021 01/05/2021  Height - - -  Weight - - -  BMI - - -  Systolic 127 135 818  Diastolic 76 88 93  Pulse 81 109 -    General:  No acute distress,  Alert and oriented, Non-Toxic, Normal speech and affect Her breast reduction result looks good with nice shape size and symmetry.  Overall the scars did well with the exception of the lateral 4 cm on both sides coming a bit thick and widened.  She is continuing with silicone sheeting and is going to get some scar cream today.  Around her umbilicus there is a 3 to 4 cm longitudinal keloid superior to the umbilicus and a smaller 1 to 2 cm diameter keloid inferior to the umbilicus.  Assessment/Plan Patient presents with some keloids around her umbilicus.  We discussed steroid injection.  We discussed the risks and benefits that include bleeding, infection, damage to surrounding structures need for additional procedures.  We discussed the potential for skin hypopigmentation that should be temporary.  She elected to proceed.  1 cc of Kenalog 40 was mixed with 1.5 cc of lidocaine with epinephrine and after prepping the area with an alcohol pad this was injected into the keloids both superior and inferior to the umbilicus.  She tolerated this fine.  We will plan to hold off on doing any injections for her axillary scars which have widened a bit.  She is going to continue the silicone sheeting  and silicone scar cream and follow-up with [place] on an as-needed basis.  All of her questions were answered.  Alexis Richardson 04/02/2021, 9:33 AM

## 2021-08-06 ENCOUNTER — Other Ambulatory Visit: Payer: Self-pay

## 2021-08-06 ENCOUNTER — Ambulatory Visit (INDEPENDENT_AMBULATORY_CARE_PROVIDER_SITE_OTHER): Payer: Medicaid Other | Admitting: Plastic Surgery

## 2021-08-06 DIAGNOSIS — N62 Hypertrophy of breast: Secondary | ICD-10-CM

## 2021-08-06 DIAGNOSIS — L910 Hypertrophic scar: Secondary | ICD-10-CM | POA: Diagnosis not present

## 2021-08-06 NOTE — Progress Notes (Signed)
   Referring Provider Fleet Contras, MD [address] [place],  [place] [address]   CC:  Chief Complaint  Patient presents with   Follow-up      Alexis Richardson is an 20 y.o. female.  HPI: Patient presents to discuss her keloid/hypertrophic scars.  I did have breast reduction on her quite a few months ago and she did develop some thickening of the scars towards the axilla.  At her last visit I injected 2 keloids around her umbilicus and she feels like these have essentially disappeared.  She wants to see if the axillary scars of her breast reduction could be injected as well.  Review of Systems General: Denies fevers and chills  Physical Exam Vitals with BMI 01/21/2021 01/14/2021 01/05/2021  Height - - -  Weight - - -  BMI - - -  Systolic 127 135 244  Diastolic 76 88 93  Pulse 81 109 -    General:  No acute distress,  Alert and oriented, Non-Toxic, Normal speech and affect Examination shows about 4 cm in the lateral aspect of her breast reduction incision of scar hypertrophy.  I probably would classify this as hypertrophic scar as its thick and wide but not much beyond the borders of the scar itself I believe.  Assessment/Plan Patient presents with hypertrophic scarring in the lateral aspect of her breast reduction incisions.  We discussed the pros and cons of steroid injection and she is interested in moving forward.  Both areas were prepped with an alcohol pad.  On the left side 1 cc of Kenalog 40 was mixed with 2 cc of lidocaine with epinephrine and this was injected into the scar.  The same thing was done on the right side.  She tolerated this fine.  We will plan to see her in 6 to 8 weeks to check her progress.  All her questions were answered.  Alexis Richardson 08/06/2021, 2:32 PM

## 2021-10-14 ENCOUNTER — Ambulatory Visit: Payer: Medicaid Other | Admitting: Plastic Surgery

## 2021-11-04 ENCOUNTER — Ambulatory Visit: Payer: Medicaid Other | Admitting: Plastic Surgery

## 2021-11-04 ENCOUNTER — Institutional Professional Consult (permissible substitution): Payer: Medicaid Other | Admitting: Plastic Surgery

## 2021-11-19 ENCOUNTER — Ambulatory Visit: Payer: Medicaid Other | Admitting: Plastic Surgery

## 2021-12-02 ENCOUNTER — Ambulatory Visit: Payer: Medicaid Other | Admitting: Plastic Surgery

## 2021-12-16 ENCOUNTER — Ambulatory Visit: Payer: Medicaid Other | Admitting: Plastic Surgery

## 2021-12-16 ENCOUNTER — Other Ambulatory Visit: Payer: Self-pay

## 2021-12-16 DIAGNOSIS — N62 Hypertrophy of breast: Secondary | ICD-10-CM

## 2021-12-16 DIAGNOSIS — L910 Hypertrophic scar: Secondary | ICD-10-CM | POA: Diagnosis not present

## 2021-12-16 NOTE — Progress Notes (Signed)
° °  Referring Provider Fleet Contras, MD [address] [place],  [place] [address]   CC:  Chief Complaint  Patient presents with   Follow-up      Alexis Richardson is an 21 y.o. female.  HPI: Patient presents about 8 months status post bilateral breast reduction.  She overall is doing well.  She has developed some thick axillary scars just in the most lateral aspect of her incision.  We did steroid injections at her last visit and this helped both sides quite a bit.  She says the right side is totally flat but the left side still has a few more firm areas that she would like to have addressed.  Review of Systems General: Denies fevers and chills  Physical Exam Vitals with BMI 01/21/2021 01/14/2021 01/05/2021  Height - - -  Weight - - -  BMI - - -  Systolic 127 135 944  Diastolic 76 88 93  Pulse 81 109 -    General:  No acute distress,  Alert and oriented, Non-Toxic, Normal speech and affect Examination shows much improved flattening of her scars in the lateral aspect of her incisions on each side.  Right side looks totally flat and left side still has a few firm areas around the borders.  Assessment/Plan Patient is a good candidate for repeat steroid injection of the left axilla.  We reviewed risks and benefits.  She is interested in moving forward.  1 cc of Kenalog 40 was diluted diluted in 3 cc lidocaine with epinephrine and this was injected into the scar directly with a 27-gauge needle.  This was done after prepping with alcohol pad.  She tolerated this fine.  Plan to have her watch this for a few more months and return if she has any further concerns.  All of her questions were answered  [name] 12/16/2021, 4:08 PM

## 2022-07-14 ENCOUNTER — Ambulatory Visit (INDEPENDENT_AMBULATORY_CARE_PROVIDER_SITE_OTHER): Payer: Medicaid Other

## 2022-07-14 ENCOUNTER — Inpatient Hospital Stay (HOSPITAL_COMMUNITY)
Admission: AD | Admit: 2022-07-14 | Discharge: 2022-07-14 | Disposition: A | Discharge status: Home / Self Care | Payer: Medicaid Other | Attending: Obstetrics & Gynecology | Admitting: Obstetrics & Gynecology

## 2022-07-14 ENCOUNTER — Encounter (HOSPITAL_COMMUNITY): Payer: Self-pay

## 2022-07-14 DIAGNOSIS — Z3201 Encounter for pregnancy test, result positive: Secondary | ICD-10-CM

## 2022-07-14 DIAGNOSIS — Z3200 Encounter for pregnancy test, result unknown: Secondary | ICD-10-CM

## 2022-07-14 NOTE — MAU Note (Signed)
...  Alexis Richardson is a 21 y.o. at Unknown here in MAU reporting: 2 positive pregnancy tests last night while at home. She desires to confirm her pregnancy and schedule her first OB appointment. Denies pain and vaginal bleeding.  LMP: 06/19/2022  Pain score: Denies pain.

## 2022-07-14 NOTE — MAU Provider Note (Signed)
Event Date/Time   First Provider Initiated Contact with Patient 07/14/22 1522      S Ms. Alexis Richardson is a 21 y.o. G1P1001 patient who presents to MAU today with complaint of positive pregnancy test. She reports 2 home pregnancy tests. She denies any pain or bleeding.    O BP (!) 128/91 (BP Location: Right Arm)   Pulse 83   Temp 98.4 F (36.9 C) (Oral)   Resp 17   Ht 5\' 3"  (1.6 m)   Wt 101.2 kg   SpO2 100%   BMI 39.52 kg/m  Physical Exam Vitals and nursing note reviewed.  Constitutional:      General: She is not in acute distress.    Appearance: She is well-developed.  HENT:     Head: Normocephalic.  Eyes:     Pupils: Pupils are equal, round, and reactive to light.  Cardiovascular:     Rate and Rhythm: Normal rate and regular rhythm.     Heart sounds: Normal heart sounds.  Pulmonary:     Effort: Pulmonary effort is normal. No respiratory distress.     Breath sounds: Normal breath sounds.  Abdominal:     General: Bowel sounds are normal. There is no distension.     Palpations: Abdomen is soft.     Tenderness: There is no abdominal tenderness.  Skin:    General: Skin is warm and dry.  Neurological:     Mental Status: She is alert and oriented to person, place, and time.  Psychiatric:        Mood and Affect: Mood normal.        Behavior: Behavior normal.        Thought Content: Thought content normal.        Judgment: Judgment normal.     A Medical screening exam complete 1. Possible pregnancy      P Discharge from MAU in stable condition Patient given the option of transfer to [facility] for further evaluation or seek care in outpatient facility of choice  List of options for follow-up given  Warning signs for worsening condition that would warrant emergency follow-up discussed Patient may return to MAU as needed   [facility], Rolm Bookbinder 07/14/2022 3:22 PM

## 2022-07-14 NOTE — Progress Notes (Signed)
Possible Pregnancy  Here today to leave urine specimen for pregnancy confirmation. UPT in office today is positive. Pt reports first positive home UPT on 07/13/22. Reviewed dating with patient:   LMP: 06/19/22 EDD: 03/26/23 3w 4d today  This is an unplanned, but desired pregnancy. OB history reviewed. Reviewed medications and allergies with patient. Recommended pt begin prenatal vitamin and schedule prenatal care. Pt desires prenatal care in our office; front office notified to schedule appts.  Pt reports history of PE in 2018. States that in last pregnancy she took daily Lovenox injections until approx 6 weeks PP. Pt is concerned she may need to do this again. Explained I will consult with provider and contact pt with recommendation.   Marjo Bicker, RN 07/14/2022  5:26 PM

## 2022-07-14 NOTE — Discharge Instructions (Signed)

## 2022-07-15 LAB — POCT PREGNANCY, URINE: Preg Test, Ur: POSITIVE — AB

## 2022-07-20 ENCOUNTER — Other Ambulatory Visit: Payer: Self-pay | Admitting: Obstetrics and Gynecology

## 2022-07-20 ENCOUNTER — Telehealth: Payer: Self-pay | Admitting: Family Medicine

## 2022-07-20 MED ORDER — ENOXAPARIN SODIUM 40 MG/0.4ML IJ SOSY: 40.0000 mg | PREFILLED_SYRINGE | INTRAMUSCULAR | 8 refills | Status: DC

## 2022-07-20 NOTE — Telephone Encounter (Signed)
Patient called in because she was prescribed lovenox but no one called her to explain why she is having to take it again and she wants more details and information about it.

## 2022-07-21 NOTE — Telephone Encounter (Signed)
Patient reached out via telephone to get clarification about enoxaparin prescription. 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 I also discussed with patient that should she have a PE during this pregnancy it would require multiple daily injections of enoxaparin as well of monitoring levels. Patient appreciated the clarification and is agreeable to starting enoxaparin.  Thank you for allowing pharmacy to be a part of this patient's care.  Argentina Ponder, PharmD, BCPPS

## 2022-08-02 ENCOUNTER — Inpatient Hospital Stay (HOSPITAL_COMMUNITY)
Admission: AD | Admit: 2022-08-02 | Discharge: 2022-08-02 | Disposition: A | Discharge status: Home / Self Care | Payer: Medicaid Other | Attending: Obstetrics and Gynecology | Admitting: Obstetrics and Gynecology

## 2022-08-02 ENCOUNTER — Encounter (HOSPITAL_COMMUNITY): Payer: Self-pay | Admitting: Obstetrics and Gynecology

## 2022-08-02 ENCOUNTER — Inpatient Hospital Stay (HOSPITAL_COMMUNITY): Payer: Medicaid Other

## 2022-08-02 DIAGNOSIS — O26891 Other specified pregnancy related conditions, first trimester: Secondary | ICD-10-CM | POA: Insufficient documentation

## 2022-08-02 DIAGNOSIS — Z3A01 Less than 8 weeks gestation of pregnancy: Secondary | ICD-10-CM | POA: Insufficient documentation

## 2022-08-02 DIAGNOSIS — Z6791 Unspecified blood type, Rh negative: Secondary | ICD-10-CM

## 2022-08-02 DIAGNOSIS — O209 Hemorrhage in early pregnancy, unspecified: Secondary | ICD-10-CM | POA: Insufficient documentation

## 2022-08-02 DIAGNOSIS — Z3491 Encounter for supervision of normal pregnancy, unspecified, first trimester: Secondary | ICD-10-CM

## 2022-08-02 DIAGNOSIS — R109 Unspecified abdominal pain: Secondary | ICD-10-CM | POA: Insufficient documentation

## 2022-08-02 DIAGNOSIS — O09291 Supervision of pregnancy with other poor reproductive or obstetric history, first trimester: Secondary | ICD-10-CM | POA: Insufficient documentation

## 2022-08-02 DIAGNOSIS — O09292 Supervision of pregnancy with other poor reproductive or obstetric history, second trimester: Secondary | ICD-10-CM | POA: Diagnosis not present

## 2022-08-02 DIAGNOSIS — N930 Postcoital and contact bleeding: Secondary | ICD-10-CM | POA: Diagnosis not present

## 2022-08-02 LAB — GC/CHLAMYDIA PROBE AMP (~~LOC~~) NOT AT ARMC
Chlamydia: NEGATIVE
Comment: NEGATIVE
Comment: NORMAL
Neisseria Gonorrhea: NEGATIVE

## 2022-08-02 LAB — URINALYSIS, ROUTINE W REFLEX MICROSCOPIC
Bacteria, UA: NONE SEEN
Bilirubin Urine: NEGATIVE
Glucose, UA: NEGATIVE mg/dL
Ketones, ur: NEGATIVE mg/dL
Leukocytes,Ua: NEGATIVE
Nitrite: NEGATIVE
Protein, ur: NEGATIVE mg/dL
Specific Gravity, Urine: 1.018 (ref 1.005–1.030)
pH: 6 (ref 5.0–8.0)

## 2022-08-02 LAB — CBC
HCT: 34.2 % — ABNORMAL LOW (ref 36.0–46.0)
Hemoglobin: 11.6 g/dL — ABNORMAL LOW (ref 12.0–15.0)
MCH: 28.8 pg (ref 26.0–34.0)
MCHC: 33.9 g/dL (ref 30.0–36.0)
MCV: 84.9 fL (ref 80.0–100.0)
Platelets: 232 10*3/uL (ref 150–400)
RBC: 4.03 MIL/uL (ref 3.87–5.11)
RDW: 14.1 % (ref 11.5–15.5)
WBC: 11.2 10*3/uL — ABNORMAL HIGH (ref 4.0–10.5)
nRBC: 0 % (ref 0.0–0.2)

## 2022-08-02 LAB — WET PREP, GENITAL
Clue Cells Wet Prep HPF POC: NONE SEEN
Trich, Wet Prep: NONE SEEN
WBC, Wet Prep HPF POC: >=10 — AB (ref <10)
Yeast Wet Prep HPF POC: NONE SEEN

## 2022-08-02 LAB — HCG, QUANTITATIVE, PREGNANCY: hCG, Beta Chain, Quant, S: 33860 m[IU]/mL — ABNORMAL HIGH (ref <5)

## 2022-08-02 MED ORDER — RHO D IMMUNE GLOBULIN 1500 UNIT/2ML IJ SOSY
300.0000 ug | PREFILLED_SYRINGE | Freq: Once | INTRAMUSCULAR | Status: AC
  Administered 2022-08-02: 300 ug via INTRAMUSCULAR
  Filled 2022-08-02: qty 2

## 2022-08-02 NOTE — Discharge Instructions (Signed)
Return to care  If you have heavier bleeding that soaks through more than 2 pads per hour for an hour or more If you bleed so much that you feel like you might pass out or you do pass out If you have significant abdominal pain that is not improved with Tylenol   

## 2022-08-02 NOTE — MAU Note (Signed)
..  Alexis Richardson is a 21 y.o. at [redacted]w[redacted]d here in MAU reporting: vaginal bleeding and cramping that began around ago. The bleeding is only when she wipes, none on her underwear.  Pt reports she was told to come in with any bleeding because she is RH neg.   Pain score: 5/10 Vitals:   08/02/22 0036  BP: (!) 141/94  Pulse: 80  Resp: 18  Temp: 97.9 F (36.6 C)  SpO2: 100%   Lab orders placed from triage:  UA

## 2022-08-02 NOTE — MAU Provider Note (Signed)
History     [id number]  Arrival date and time: 08/02/22 0025    Chief Complaint  Patient presents with   Vaginal Bleeding   Abdominal Pain     HPI Alexis Richardson is a 21 y.o. at [redacted]w[redacted]d who presents for vaginal bleeding & abdominal pain.  Symptoms started about an hour prior to arrival. Reports red/pink spotting on toilet paper. Not bleeding into a pad or passing clots. Reports abdominal cramping started after she noticed the bleeding. Denies dysuria, vaginal discharge, or vaginal irritation. Had intercourse 2 hours prior to onset of bleeding.    OB History     Gravida  2   Para  1   Term  1   Preterm  0   AB  0   Living  1      SAB  0   IAB  0   Ectopic  0   Multiple  0   Live Births  1           Past Medical History:  Diagnosis Date   Gestational diabetes 03/26/2019   Neg postpartum GTT   Gestational hypertension 05/03/2019   Pulmonary embolism (HCC)    a. Dx 09/2017, had IUD removed in case this contributed.   Right ventricular dilation    a. at time of PE 09/2017.    Past Surgical History:  Procedure Laterality Date   BREAST REDUCTION SURGERY Bilateral 01/05/2021   Procedure: BILATERAL MAMMARY REDUCTION;  Surgeon: [name], MD;  Location: [facility];  Service: Plastics;  Laterality: Bilateral;   KELOID EXCISION      Family History  Problem Relation Age of Onset   Hypertension Mother    Hypertension Maternal Aunt    Cancer Maternal Grandfather    Pulmonary embolism Neg Hx    Heart disease Neg Hx     No Known Allergies  No current facility-administered medications on file prior to encounter.   Current Outpatient Medications on File Prior to Encounter  Medication Sig Dispense Refill   enoxaparin (LOVENOX) 40 MG/0.4ML injection Inject 0.4 mLs (40 mg total) into the skin daily. 12 mL 8     ROS Pertinent positives and negative per HPI, all others reviewed and negative  Physical Exam   BP (!) 141/94 (BP Location:  Right Arm)   Pulse 80   Temp 97.9 F (36.6 C) (Oral)   Resp 18   Ht 5\' 4"  (1.626 m)   Wt 104.1 kg   LMP 06/19/2022   SpO2 100%   BMI 39.39 kg/m   Patient Vitals for the past 24 hrs:  BP Temp Temp src Pulse Resp SpO2 Height Weight  08/02/22 0036 (!) 141/94 97.9 F (36.6 C) Oral 80 18 100 % 5\' 4"  (1.626 m) 104.1 kg    Physical Exam Vitals and nursing note reviewed. Exam conducted with a chaperone present.  Constitutional:      General: She is not in acute distress.    Appearance: She is well-developed.  HENT:     Head: Normocephalic and atraumatic.  Pulmonary:     Effort: Pulmonary effort is normal. No respiratory distress.  Abdominal:     General: Abdomen is flat.     Palpations: Abdomen is soft.     Tenderness: There is no abdominal tenderness. There is no guarding or rebound.  Genitourinary:    Comments: NEFG. No blood. Cervix closed.  Skin:    General: Skin is warm and dry.  Neurological:  Mental Status: She is alert.      Cervical Exam Dilation: Closed Effacement (%): Thick Exam by:: Judeth Horn, NP   Labs Results for orders placed or performed during the hospital encounter of 08/02/22 (from the past 24 hour(s))  Urinalysis, Routine w reflex microscopic Urine, Clean Catch     Status: Abnormal   Collection Time: 08/02/22  1:00 AM  Result Value Ref Range   Color, Urine STRAW (A) YELLOW   APPearance CLEAR CLEAR   Specific Gravity, Urine 1.018 1.005 - 1.030   pH 6.0 5.0 - 8.0   Glucose, UA NEGATIVE NEGATIVE mg/dL   Hgb urine dipstick MODERATE (A) NEGATIVE   Bilirubin Urine NEGATIVE NEGATIVE   Ketones, ur NEGATIVE NEGATIVE mg/dL   Protein, ur NEGATIVE NEGATIVE mg/dL   Nitrite NEGATIVE NEGATIVE   Leukocytes,Ua NEGATIVE NEGATIVE   RBC / HPF 0-5 0 - 5 RBC/hpf   WBC, UA 0-5 0 - 5 WBC/hpf   Bacteria, UA NONE SEEN NONE SEEN   Squamous Epithelial / LPF 0-5 0 - 5  Wet prep, genital     Status: Abnormal   Collection Time: 08/02/22  1:02 AM  Result Value  Ref Range   Yeast Wet Prep HPF POC NONE SEEN NONE SEEN   Trich, Wet Prep NONE SEEN NONE SEEN   Clue Cells Wet Prep HPF POC NONE SEEN NONE SEEN   WBC, Wet Prep HPF POC >=10 (A) <10   Sperm PRESENT   CBC     Status: Abnormal   Collection Time: 08/02/22  1:33 AM  Result Value Ref Range   WBC 11.2 (H) 4.0 - 10.5 K/uL   RBC 4.03 3.87 - 5.11 MIL/uL   Hemoglobin 11.6 (L) 12.0 - 15.0 g/dL   HCT 25.7 (L) 49.3 - 55.2 %   MCV 84.9 80.0 - 100.0 fL   MCH 28.8 26.0 - 34.0 pg   MCHC 33.9 30.0 - 36.0 g/dL   RDW 17.4 71.5 - 95.3 %   Platelets 232 150 - 400 K/uL   nRBC 0.0 0.0 - 0.2 %  Rh IG workup (includes ABO/Rh)     Status: None (Preliminary result)   Collection Time: 08/02/22  1:33 AM  Result Value Ref Range   Gestational Age(Wks) 6    ABO/RH(D)      A NEG Performed at [facility], [address], [place], [place] [address]    Antibody Screen PENDING     Imaging US OB LESS THAN 14 WEEKS WITH OB TRANSVAGINAL  Result Date: 08/02/2022 CLINICAL DATA:  Pregnant, vaginal bleeding EXAM: OBSTETRIC <14 WK [place] AND TRANSVAGINAL OB US TECHNIQUE: Both transabdominal and transvaginal ultrasound examinations were performed for complete evaluation of the gestation as well as the maternal uterus, adnexal regions, and pelvic cul-de-sac. Transvaginal technique was performed to assess early pregnancy. COMPARISON:  None Available. FINDINGS: Intrauterine gestational sac: Single Yolk sac:  Visualized. Embryo:  Visualized. Cardiac Activity: Visualized. Heart Rate: 147 bpm CRL:  2.91 mm   5 w   6 d                  [place] EDC: 03/29/2023 Subchorionic hemorrhage:  None visualized. Maternal uterus/adnexae: Bilateral ovaries are within normal limits. No free fluid. IMPRESSION: Single intrauterine gestation with cardiac activity, measuring 5 weeks 6 days by crown-rump length, as above. Electronically Signed   By: Charline Bills M.D.   On: 08/02/2022 01:57    MAU Course  Procedures Lab Orders  Wet prep,  genital         Urinalysis, Routine w reflex microscopic Urine, Clean Catch         CBC         hCG, quantitative, pregnancy     Meds ordered this encounter  Medications   rho (d) immune globulin (RHIG/RHOPHYLAC) injection 300 mcg   Imaging Orders         US OB LESS THAN 14 WEEKS WITH OB TRANSVAGINAL     MDM +UPT UA, wet prep, GC/chlamydia, CBC, ABO/Rh, quant hCG, and [place] today to rule out ectopic pregnancy which can be life threatening.   RH negative - rhogam given today in MAU  No blood on exam & cervix closed. Ultrasound shows live IUP  Assessment and Plan   1. PCB (post coital bleeding)  -Reviewed bleeding precautions & reasons to return to MAU  2. Rh negative state in antepartum period, first trimester  -Rhogam given today, Problem list updated  3. Normal IUP (intrauterine pregnancy) on prenatal ultrasound, first trimester  -Keep scheduled prenatal appointment  4. [redacted] weeks gestation of pregnancy      Judeth Horn, NP 08/02/22 2:11 AM

## 2022-08-03 LAB — RH IG WORKUP (INCLUDES ABO/RH)
ABO/RH(D): A NEG
Antibody Screen: NEGATIVE
Gestational Age(Wks): 6
Unit division: 0

## 2022-08-24 ENCOUNTER — Telehealth: Payer: Medicaid Other

## 2022-09-01 ENCOUNTER — Telehealth (INDEPENDENT_AMBULATORY_CARE_PROVIDER_SITE_OTHER): Payer: Medicaid Other

## 2022-09-01 DIAGNOSIS — O0990 Supervision of high risk pregnancy, unspecified, unspecified trimester: Secondary | ICD-10-CM | POA: Insufficient documentation

## 2022-09-01 DIAGNOSIS — Z3689 Encounter for other specified antenatal screening: Secondary | ICD-10-CM

## 2022-09-01 DIAGNOSIS — Z6791 Unspecified blood type, Rh negative: Secondary | ICD-10-CM

## 2022-09-01 MED ORDER — GOJJI WEIGHT SCALE MISC: 1.0000 | 0 refills | Status: DC | PRN

## 2022-09-01 MED ORDER — BLOOD PRESSURE KIT DEVI: 1.0000 | 0 refills | Status: DC | PRN

## 2022-09-01 NOTE — Progress Notes (Signed)
New OB Intake  I connected with  Alexis Richardson on 09/01/22 at  1:15 PM EDT by MyChart Video Visit and verified that I am speaking with the correct person using two identifiers. Nurse is located at [facility] and pt is located at home.  I discussed the limitations, risks, security and privacy concerns of performing an evaluation and management service by telephone and the availability of in person appointments. I also discussed with the patient that there may be a patient responsible charge related to this service. The patient expressed understanding and agreed to proceed.  I explained I am completing New OB Intake today. We discussed her EDD of 03/26/2023 that is based on LMP of 06/19/2022. Pt is G2/P1. I reviewed her allergies, medications, Medical/Surgical/OB history, and appropriate screenings. I informed her of [facility] services. [facility] information placed in AVS. Based on history, this is a/an  pregnancy complicated by Gestational hypertension and Gestational diabetes  .   Patient Active Problem List   Diagnosis Date Noted   Supervision of high risk pregnancy, antepartum 09/01/2022   Rh negative state in antepartum period 10/09/2018   History of pulmonary embolus (PE)     Concerns addressed today: -Hx of gestational diabetes with prior pregnancy, informed she will have to do her 2 hours gtt early. Patient understood.   Delivery Plans Plans to deliver at [facility]. Patient given information for [facility] Healthy Baby website for more information about Women's and [place]. Patient is not interested in water birth. Offered upcoming OB visit with CNM to discuss further.  MyChart/Babyscripts MyChart access verified. I explained pt will have some visits in office and some virtually. Babyscripts instructions given and order placed. Patient verifies receipt of registration text/e-mail. Account successfully created and app downloaded.  Blood Pressure Cuff/Weight Scale Blood pressure cuff ordered for  patient to pick-up from [employer]. Explained after first prenatal appt pt will check weekly and document in 78. Patient does not  have weight scale. Weight scale ordered for patient to pick up from [employer].   Anatomy US Explained first scheduled [place] will be around 19 weeks. Anatomy US scheduled for 11/01/2022 at 09:45 AM. Pt notified to arrive at 09:30 AM.  Labs Discussed Natera genetic screening with patient. Would like both Panorama and Horizon drawn at new OB visit. Routine prenatal labs needed.  Covid Vaccine Patient has not covid vaccine.   Is patient a CenteringPregnancy candidate?  Declined Declined due to Support Person Concern    Is patient a Mom+Baby Combined Care candidate?  Not a candidate     Social Determinants of Health Food Insecurity: Patient denies food insecurity. WIC Referral: Patient is interested in referral to [facility].  Transportation: Patient denies transportation needs. Childcare: Discussed no children allowed at ultrasound appointments. Offered childcare services; patient declines childcare services at this time.  First visit review I reviewed new OB appt with pt. I explained she will have a provider visit that includes initial ob labs, genetic screening, pap smear, and pelvic exam. Explained pt will be seen by Manya Silvas CNM at first visit; encounter routed to appropriate provider. Explained that patient will be seen by pregnancy navigator following visit with provider.   Mariane Baumgarten, [place] 09/01/2022  1:47 PM

## 2022-09-07 ENCOUNTER — Other Ambulatory Visit: Payer: Self-pay

## 2022-09-07 ENCOUNTER — Other Ambulatory Visit (HOSPITAL_COMMUNITY)
Admission: RE | Admit: 2022-09-07 | Discharge: 2022-09-07 | Disposition: A | Discharge status: Home / Self Care | Payer: Medicaid Other | Source: Ambulatory Visit | Attending: Advanced Practice Midwife | Admitting: Advanced Practice Midwife

## 2022-09-07 ENCOUNTER — Encounter: Payer: Self-pay | Admitting: Advanced Practice Midwife

## 2022-09-07 ENCOUNTER — Other Ambulatory Visit: Payer: Self-pay | Admitting: Advanced Practice Midwife

## 2022-09-07 ENCOUNTER — Ambulatory Visit (INDEPENDENT_AMBULATORY_CARE_PROVIDER_SITE_OTHER): Payer: Medicaid Other | Admitting: Advanced Practice Midwife

## 2022-09-07 VITALS — BP 128/73 | HR 97 | Wt 227.5 lb

## 2022-09-07 DIAGNOSIS — O0990 Supervision of high risk pregnancy, unspecified, unspecified trimester: Secondary | ICD-10-CM | POA: Diagnosis present

## 2022-09-07 DIAGNOSIS — Z8759 Personal history of other complications of pregnancy, childbirth and the puerperium: Secondary | ICD-10-CM

## 2022-09-07 DIAGNOSIS — Z9889 Other specified postprocedural states: Secondary | ICD-10-CM

## 2022-09-07 DIAGNOSIS — O09291 Supervision of pregnancy with other poor reproductive or obstetric history, first trimester: Secondary | ICD-10-CM

## 2022-09-07 DIAGNOSIS — O0991 Supervision of high risk pregnancy, unspecified, first trimester: Secondary | ICD-10-CM

## 2022-09-07 DIAGNOSIS — Z3A11 11 weeks gestation of pregnancy: Secondary | ICD-10-CM

## 2022-09-07 DIAGNOSIS — O26891 Other specified pregnancy related conditions, first trimester: Secondary | ICD-10-CM

## 2022-09-07 DIAGNOSIS — Z8632 Personal history of gestational diabetes: Secondary | ICD-10-CM

## 2022-09-07 DIAGNOSIS — Z86711 Personal history of pulmonary embolism: Secondary | ICD-10-CM

## 2022-09-07 DIAGNOSIS — O09299 Supervision of pregnancy with other poor reproductive or obstetric history, unspecified trimester: Secondary | ICD-10-CM | POA: Insufficient documentation

## 2022-09-07 DIAGNOSIS — Z6791 Unspecified blood type, Rh negative: Secondary | ICD-10-CM

## 2022-09-07 MED ORDER — CONCEPT DHA 53.5-38-1 MG PO CAPS: 1.0000 | ORAL_CAPSULE | Freq: Every day | ORAL | 12 refills | Status: DC

## 2022-09-07 NOTE — Progress Notes (Signed)
INITIAL PRENATAL VISIT  Subjective:   Alexis Richardson is 21 y.o. G44P1001 female being seen today for her first obstetrical visit. She is not received other prenatal care previously this pregnancy. This is not a planned pregnancy. This is a desired pregnancy, but is challenging to be pregnant while school.  She is at 59w3dgestation by LMP/6 wk [place] obstetrical history is significant for  GHTN, A1GDM, Hx unprovoked PE on Lovenox. Taking irregularly.   . Relationship with FOB: significant other, living together. Patient does intend to breast feed. Hx breast reduction. Op Note in Epic. Pregnancy history fully reviewed.  Review of Systems:   Positive for no complaints.  Negative for no bleeding, no contractions, and no cramping.  Objective:    Obstetric History OB History  Gravida Para Term Preterm AB Living  '2 1 1 ' 0 0 1  SAB IAB Ectopic Multiple Live Births  0 0 0 0 1    # Outcome Date GA Lbr Len/2nd Weight Sex Delivery Anes PTL Lv  2 Current           1 Term 05/04/19 319w5d2:30 / 00:49 7 lb 6 oz (3.345 kg) F Vag-Spont EPI  LIV    Past Medical History:  Diagnosis Date   Gestational diabetes 03/26/2019   Neg postpartum GTT   Gestational hypertension 05/03/2019   [place]   a. Dx 09/2017, had IUD removed in case this contributed.   Right ventricular dilation    a. at time of PE 09/2017.    Past Surgical History:  Procedure Laterality Date   BREAST REDUCTION SURGERY Bilateral 01/05/2021   Procedure: BILATERAL MAMMARY REDUCTION;  Surgeon: PaCindra PresumeMD;  [facility];  Laterality: Bilateral;   KELOID EXCISION      Current Outpatient Medications on File Prior to Visit  Medication Sig Dispense Refill   Prenatal Vit-Fe Fumarate-FA (PRENATAL MULTIVITAMIN) TABS tablet Take 1 tablet by mouth daily at 12 noon.     Blood Pressure Monitoring (BLOOD PRESSURE KIT) DEVI 1 Device by Does not apply route as needed. 1 each  0   enoxaparin (LOVENOX) 40 MG/0.4ML injection Inject 0.4 mLs (40 mg total) into the skin daily. 12 mL 8   Misc. Devices (GOJJI WEIGHT SCALE) MISC 1 Device by Does not apply route as needed. 1 each 0   No current facility-administered medications on file prior to visit.    No Known Allergies  Social History:  reports that she has never smoked. She has never used smokeless tobacco. She reports that she does not currently use alcohol. She reports that she does not currently use drugs.  Family History  Problem Relation Age of Onset   Hypertension Mother    Hypertension Maternal Aunt    Cancer Maternal Grandfather    Pulmonary embolism Neg Hx    Heart disease Neg Hx     The following portions of the patient's history were reviewed and updated as appropriate: allergies, current medications, past family history, past medical history, past social history, past surgical history and problem list.  Physical Exam:  BP 128/73   Pulse 97   Wt 227 lb 8 oz (103.2 kg)   LMP 06/19/2022   BMI 39.05 kg/m  CONSTITUTIONAL: Well-developed, well-nourished female in no acute distress.  HENT:  Normocephalic, atraumatic. EYES: Conjunctivae normal. No scleral icterus.  SKIN: Skin is warm and dry. No rash noted. Not diaphoretic. No erythema. No pallor. MUSCULOSKELETAL: Normal range  of motion. No tenderness. NEUROLOGIC: Alert and oriented to person, place, and time. Normal muscle tone coordination.  PSYCHIATRIC: Normal mood and affect. Normal behavior. Normal judgment and thought content. CARDIOVASCULAR: Normal heart rate noted. RESPIRATORY: Effort and rate normal. BREASTS: Declined ABDOMEN: Soft, no distention, tenderness, rebound or guarding. Fundal ht: 12 PELVIC: Normal appearing external genitalia; normal appearing vaginal mucosa and cervix.  No abnormal discharge noted.  Pap smear obtained.  Uterus S=D, no other palpable masses, no uterine or adnexal tenderness. Fetal Status: Fetal Heart Rate (bpm):  163   Movement: Absent     Indications for ASA therapy (per uptodate) One of the following: Previous pregnancy with preeclampsia, especially early onset and with an adverse outcome No Multifetal gestation No Chronic hypertension No Type 1 or 2 diabetes mellitus No Chronic kidney disease No Autoimmune disease (antiphospholipid syndrome, systemic lupus erythematosus) No  Two or more of the following: Nulliparity No Obesity (body mass index >30 kg/m2) Yes Family history of preeclampsia in mother or sister No Age ?35 years No Sociodemographic characteristics (African American race, low socioeconomic level) Yes Personal risk factors (eg, previous pregnancy with low birth weight or small for gestational age infant, previous adverse pregnancy outcome [eg, stillbirth], interval >10 years between pregnancies) No   Assessment:   Pregnancy: G2P1001  1. Supervision of high risk pregnancy, antepartum  - Culture, OB Urine - CBC/D/Plt+RPR+Rh+ABO+RubIgG... - Hemoglobin A1c - Cytology - PAP( [place]) - Panorama Prenatal Test Full Panel - [facility] Custom - Prenat-FeFum-FePo-FA-Omega 3 (CONCEPT DHA) 53.5-38-1 MG CAPS; Take 1 tablet by mouth daily.  Dispense: 30 capsule; Refill: 12  2. Rh negative state in antepartum period - Rhophylac at 28 weeks  3. History of pulmonary embolus (PE) - Continue Lovenox per pharmacy recommendations  4. [redacted] weeks gestation of pregnancy   5. History of gestational diabetes in prior pregnancy, currently pregnant - Hgb A1C  6. History of gestational hypertension - Baseline P:C ratio - Meets criteria for Rx ASA for reduction of risk for preeclampsia. Will consult w/ attending if ASA appropriate and still indicated while on Lovenox  7. History of bilateral breast reduction surgery - Plan Lactation consultation     Plan:  Initial labs drawn/reviewed. Prenatal vitamins. Problem list reviewed and updated. Genetic screening discussed: NIPS/First  trimester screen/Quad/AFP ordered. Role of ultrasound in pregnancy discussed; Anatomy US: ordered. Amniocentesis discussed: not indicated. Follow up in 4 weeks. Traditional Discussed clinic routines, schedule of care and testing, genetic screening options, involvement of students and residents under the direct supervision of APPs and doctors and presence of female providers. Pt verbalized understanding.  [place], CNM 09/07/2022 11:10 AM

## 2022-09-08 LAB — AB SCR+ANTIBODY ID: Antibody Screen: POSITIVE — AB

## 2022-09-08 LAB — CBC/D/PLT+RPR+RH+ABO+RUBIGG...
Basophils Absolute: 0 10*3/uL (ref 0.0–0.2)
Basos: 0 %
EOS (ABSOLUTE): 0.1 10*3/uL (ref 0.0–0.4)
Eos: 1 %
HCV Ab: NONREACTIVE
HIV Screen 4th Generation wRfx: NONREACTIVE
Hematocrit: 37.9 % (ref 34.0–46.6)
Hemoglobin: 12.8 g/dL (ref 11.1–15.9)
Hepatitis B Surface Ag: NEGATIVE
Immature Grans (Abs): 0 10*3/uL (ref 0.0–0.1)
Immature Granulocytes: 0 %
Lymphocytes Absolute: 2.2 10*3/uL (ref 0.7–3.1)
Lymphs: 21 %
MCH: 29 pg (ref 26.6–33.0)
MCHC: 33.8 g/dL (ref 31.5–35.7)
MCV: 86 fL (ref 79–97)
Monocytes Absolute: 0.5 10*3/uL (ref 0.1–0.9)
Monocytes: 5 %
Neutrophils Absolute: 7.4 10*3/uL — ABNORMAL HIGH (ref 1.4–7.0)
Neutrophils: 73 %
Platelets: 239 10*3/uL (ref 150–450)
RBC: 4.42 x10E6/uL (ref 3.77–5.28)
RDW: 13.3 % (ref 11.7–15.4)
RPR Ser Ql: NONREACTIVE
Rh Factor: NEGATIVE
Rubella Antibodies, IGG: 1.92 index (ref >0.99)
WBC: 10.2 10*3/uL (ref 3.4–10.8)

## 2022-09-08 LAB — HEMOGLOBIN A1C
Est. average glucose Bld gHb Est-mCnc: 114 mg/dL
Hgb A1c MFr Bld: 5.6 % (ref 4.8–5.6)

## 2022-09-08 LAB — HCV INTERPRETATION

## 2022-09-09 LAB — URINE CULTURE, OB REFLEX

## 2022-09-09 LAB — CULTURE, OB URINE

## 2022-09-10 LAB — CYTOLOGY - PAP
Chlamydia: NEGATIVE
Comment: NEGATIVE
Comment: NEGATIVE
Comment: NORMAL
Diagnosis: UNDETERMINED — AB
High risk HPV: NEGATIVE
Neisseria Gonorrhea: NEGATIVE

## 2022-09-12 LAB — PANORAMA PRENATAL TEST FULL PANEL:PANORAMA TEST PLUS 5 ADDITIONAL MICRODELETIONS: FETAL FRACTION: 5.8

## 2022-09-13 ENCOUNTER — Encounter: Payer: Self-pay | Admitting: Advanced Practice Midwife

## 2022-09-13 ENCOUNTER — Other Ambulatory Visit: Payer: Self-pay | Admitting: Advanced Practice Midwife

## 2022-09-13 DIAGNOSIS — Z8759 Personal history of other complications of pregnancy, childbirth and the puerperium: Secondary | ICD-10-CM

## 2022-09-13 DIAGNOSIS — R87610 Atypical squamous cells of undetermined significance on cytologic smear of cervix (ASC-US): Secondary | ICD-10-CM | POA: Insufficient documentation

## 2022-09-13 MED ORDER — ASPIRIN 81 MG PO TBEC: 81.0000 mg | DELAYED_RELEASE_TABLET | Freq: Every day | ORAL | 2 refills | Status: DC

## 2022-09-14 LAB — HORIZON CUSTOM: REPORT SUMMARY: NEGATIVE

## 2022-10-05 ENCOUNTER — Encounter: Payer: Medicaid Other | Admitting: Advanced Practice Midwife

## 2022-10-14 ENCOUNTER — Encounter: Payer: Medicaid Other | Admitting: Student

## 2022-10-19 ENCOUNTER — Ambulatory Visit (INDEPENDENT_AMBULATORY_CARE_PROVIDER_SITE_OTHER): Payer: Medicaid Other

## 2022-10-19 ENCOUNTER — Other Ambulatory Visit: Payer: Self-pay

## 2022-10-19 VITALS — BP 134/87 | HR 92 | Wt 232.7 lb

## 2022-10-19 DIAGNOSIS — Z6791 Unspecified blood type, Rh negative: Secondary | ICD-10-CM

## 2022-10-19 DIAGNOSIS — Z86711 Personal history of pulmonary embolism: Secondary | ICD-10-CM

## 2022-10-19 DIAGNOSIS — O0990 Supervision of high risk pregnancy, unspecified, unspecified trimester: Secondary | ICD-10-CM

## 2022-10-19 DIAGNOSIS — O0992 Supervision of high risk pregnancy, unspecified, second trimester: Secondary | ICD-10-CM

## 2022-10-19 DIAGNOSIS — O26892 Other specified pregnancy related conditions, second trimester: Secondary | ICD-10-CM

## 2022-10-19 DIAGNOSIS — Z3A17 17 weeks gestation of pregnancy: Secondary | ICD-10-CM

## 2022-10-19 DIAGNOSIS — K5900 Constipation, unspecified: Secondary | ICD-10-CM

## 2022-10-19 DIAGNOSIS — O26899 Other specified pregnancy related conditions, unspecified trimester: Secondary | ICD-10-CM

## 2022-10-19 DIAGNOSIS — R519 Headache, unspecified: Secondary | ICD-10-CM

## 2022-10-19 DIAGNOSIS — O99612 Diseases of the digestive system complicating pregnancy, second trimester: Secondary | ICD-10-CM

## 2022-10-19 MED ORDER — ACETAMINOPHEN 500 MG PO TABS: 1000.0000 mg | ORAL_TABLET | Freq: Once | ORAL | Status: DC

## 2022-10-19 NOTE — Patient Instructions (Addendum)
Safe Medications in Pregnancy    Acne: Benzoyl Peroxide Salicylic Acid  Backache/Headache: Tylenol: 2 regular strength every 4 hours OR              2 Extra strength every 6 hours  Colds/Coughs/Allergies: Benadryl (alcohol free) 25 mg every 6 hours as needed Breath right strips Claritin Cepacol throat lozenges Chloraseptic throat spray Cold-Eeze- up to three times per day Cough drops, alcohol free Flonase (by prescription only) Guaifenesin Mucinex Robitussin DM (plain only, alcohol free) Saline nasal spray/drops Sudafed (pseudoephedrine) & Actifed ** use only after [redacted] weeks gestation and if you do not have high blood pressure Tylenol Vicks Vaporub Zinc lozenges Zyrtec   Constipation: Colace Ducolax suppositories Fleet enema Glycerin suppositories Metamucil Milk of magnesia Miralax Senokot Smooth move tea  Diarrhea: Kaopectate Imodium A-D  *NO pepto Bismol  Hemorrhoids: Anusol Anusol HC Preparation H Tucks  Indigestion: Tums Maalox Mylanta Zantac  Pepcid  Insomnia: Benadryl (alcohol free) 25mg  every 6 hours as needed Tylenol PM Unisom, no Gelcaps  Leg Cramps: Tums MagGel  Nausea/Vomiting:  Bonine Dramamine Emetrol Ginger extract Sea bands Meclizine  Nausea medication to take during pregnancy:  Unisom (doxylamine succinate 25 mg tablets) Take one tablet daily at bedtime. If symptoms are not adequately controlled, the dose can be increased to a maximum recommended dose of two tablets daily (1/2 tablet in the morning, 1/2 tablet mid-afternoon and one at bedtime). Vitamin B6 100mg  tablets. Take one tablet twice a day (up to 200 mg per day).  Skin Rashes: Aveeno products Benadryl cream or 25mg  every 6 hours as needed Calamine Lotion 1% cortisone cream  Yeast infection: Gyne-lotrimin 7 Monistat 7   **If taking multiple medications, please check labels to avoid duplicating the same active ingredients **take  medication as directed on the label ** Do not exceed 4000 mg of tylenol in 24 hours **Do not take medications that contain aspirin or ibuprofen   You have constipation which is hard stools that are difficult to pass. It is important to have regular bowel movements every 1-3 days that are soft and easy to pass. Hard stools increase your risk of hemorrhoids and are very uncomfortable.   To prevent constipation you can increase the amount of fiber in your diet. Examples of foods with fiber are leafy greens, whole grain breads, oatmeal and other grains.  It is also important to drink at least eight 8oz glass of water everyday.   If you have not has a bowel movement in 4-5 days you made need to clean out your bowel.  This will have establish normal movement through your bowel.     Miralax Clean out Take 8 capfuls of miralax in 64 oz of gatorade. You can use any fluid that appeals to you (gatorade, water, juice) Continue to drink at least eight 8 oz glasses of water throughout the day You can repeat with another 8 capfuls of miralax in 64 oz of gatorade if you are not having a large amount of stools You will need to be at home and close to a bathroom for about 8 hours when you do the above as you may need to go to the bathroom frequently.   After you are cleaned out: - Start Colace100mg  twice daily - Start Miralax once daily - Start a daily fiber supplement like metamucil or citrucel - You can safely use enemas in pregnancy  - if you are having diarrhea you can reduce to Colace once a day or miralax every other  day or a 1/2 capful daily.    [email or web address]

## 2022-10-19 NOTE — Progress Notes (Signed)
   PRENATAL VISIT NOTE  Subjective:  Alexis Richardson is a 21 y.o. G2P1001 at [redacted]w[redacted]d being seen today for ongoing prenatal care.  She is currently monitored for the following issues for this high-risk pregnancy and has History of pulmonary embolus (PE); Rh negative state in antepartum period; Supervision of high risk pregnancy, antepartum; History of gestational diabetes in prior pregnancy, currently pregnant; History of gestational hypertension; History of bilateral breast reduction surgery; and Atypical squamous cell changes of undetermined significance (ASCUS) on cervical cytology with negative high risk human papilloma virus (HPV) test result on their problem list.  Patient reports increase in headaches. Headaches are intermittent and have been ongoing since she found out she was pregnant. They are often aggravated by lights. She reports Tylenol does help. She eats 3 meals per day with snacks in between, however only drinks 3-4 cups of water per day and some juice. Contractions: Not present. Vag. Bleeding: None.  Movement: Present. Denies leaking of fluid.   The following portions of the patient's history were reviewed and updated as appropriate: allergies, current medications, past family history, past medical history, past social history, past surgical history and problem list.   Objective:   Vitals:   10/19/22 1622  BP: 134/87  Pulse: 92  Weight: 232 lb 11.2 oz (105.6 kg)    Fetal Status: Fetal Heart Rate (bpm): 154   Movement: Present     General:  Alert, oriented and cooperative. Patient is in no acute distress.  Skin: Skin is warm and dry. No rash noted.   Cardiovascular: Normal heart rate noted  Respiratory: Normal respiratory effort, no problems with respiration noted  Abdomen: Soft, gravid, appropriate for gestational age.  Pain/Pressure: Absent     Pelvic: Cervical exam deferred        Extremities: Normal range of motion.  Edema: None  Mental Status: Normal mood and affect.  Normal behavior. Normal judgment and thought content.   Assessment and Plan:  Pregnancy: G2P1001 at [redacted]w[redacted]d 1. Supervision of high risk pregnancy, antepartum - Routine OB. Doing well. - AFP today - Anticipatory guidance provided   - AFP, Serum, Open Spina Bifida  2. [redacted] weeks gestation of pregnancy   3. Pregnancy headache in second trimester - No other symptoms. BP normotensive - Has not taken Tylenol today, requesting dose while here - Encourage increase water intake. May also use magnesium supplement - Reviewed warning s/s  - acetaminophen (TYLENOL) tablet 1,000 mg  4. Rh negative state in antepartum period - Rhogam @28  weeks  5. Constipation during pregnancy, antepartum - Increase water and fiber intake - May use Miralax, stool softeners  6. History of pulmonary embolus (PE) - Taking Lovenox   Preterm labor symptoms and general obstetric precautions including but not limited to vaginal bleeding, contractions, leaking of fluid and fetal movement were reviewed in detail with the patient. Please refer to After Visit Summary for other counseling recommendations.   Return in about 4 weeks (around 11/16/2022) for HROB.  Future Appointments  Date Time Provider Department Center  11/01/2022  9:30 AM [facility] NURSE [facility]  11/01/2022  9:45 AM [facility] US4 [facility]  11/17/2022  2:35 PM 11/19/2022, MD [facility]    [facility] CNM

## 2022-10-21 LAB — AFP, SERUM, OPEN SPINA BIFIDA
AFP MoM: 0.86
AFP Value: 29.2 ng/mL
Gest. Age on Collection Date: 17.3 weeks
Maternal Age At EDD: 23.5 yr
OSBR Risk 1 IN: 10000
Test Results:: NEGATIVE
Weight: 232 [lb_av]

## 2022-11-01 ENCOUNTER — Ambulatory Visit: Payer: Medicaid Other | Admitting: *Deleted

## 2022-11-01 ENCOUNTER — Encounter: Payer: Self-pay | Admitting: *Deleted

## 2022-11-01 ENCOUNTER — Ambulatory Visit: Payer: Medicaid Other | Attending: Advanced Practice Midwife

## 2022-11-01 ENCOUNTER — Other Ambulatory Visit: Payer: Self-pay | Admitting: *Deleted

## 2022-11-01 VITALS — BP 130/74 | HR 82

## 2022-11-01 DIAGNOSIS — O0990 Supervision of high risk pregnancy, unspecified, unspecified trimester: Secondary | ICD-10-CM | POA: Insufficient documentation

## 2022-11-01 DIAGNOSIS — Z6791 Unspecified blood type, Rh negative: Secondary | ICD-10-CM | POA: Insufficient documentation

## 2022-11-01 DIAGNOSIS — O26899 Other specified pregnancy related conditions, unspecified trimester: Secondary | ICD-10-CM | POA: Diagnosis present

## 2022-11-01 DIAGNOSIS — Z362 Encounter for other antenatal screening follow-up: Secondary | ICD-10-CM

## 2022-11-01 DIAGNOSIS — Z8759 Personal history of other complications of pregnancy, childbirth and the puerperium: Secondary | ICD-10-CM

## 2022-11-01 DIAGNOSIS — O99212 Obesity complicating pregnancy, second trimester: Secondary | ICD-10-CM

## 2022-11-01 DIAGNOSIS — O09299 Supervision of pregnancy with other poor reproductive or obstetric history, unspecified trimester: Secondary | ICD-10-CM

## 2022-11-01 DIAGNOSIS — Z86711 Personal history of pulmonary embolism: Secondary | ICD-10-CM

## 2022-11-17 ENCOUNTER — Ambulatory Visit (INDEPENDENT_AMBULATORY_CARE_PROVIDER_SITE_OTHER): Payer: Medicaid Other | Admitting: Obstetrics and Gynecology

## 2022-11-17 ENCOUNTER — Other Ambulatory Visit: Payer: Self-pay

## 2022-11-17 VITALS — BP 118/76 | HR 105 | Wt 234.5 lb

## 2022-11-17 DIAGNOSIS — O09292 Supervision of pregnancy with other poor reproductive or obstetric history, second trimester: Secondary | ICD-10-CM

## 2022-11-17 DIAGNOSIS — O26892 Other specified pregnancy related conditions, second trimester: Secondary | ICD-10-CM

## 2022-11-17 DIAGNOSIS — O0990 Supervision of high risk pregnancy, unspecified, unspecified trimester: Secondary | ICD-10-CM

## 2022-11-17 DIAGNOSIS — Z86711 Personal history of pulmonary embolism: Secondary | ICD-10-CM

## 2022-11-17 DIAGNOSIS — Z6791 Unspecified blood type, Rh negative: Secondary | ICD-10-CM

## 2022-11-17 DIAGNOSIS — Z8632 Personal history of gestational diabetes: Secondary | ICD-10-CM

## 2022-11-17 DIAGNOSIS — Z8759 Personal history of other complications of pregnancy, childbirth and the puerperium: Secondary | ICD-10-CM

## 2022-11-17 DIAGNOSIS — O0992 Supervision of high risk pregnancy, unspecified, second trimester: Secondary | ICD-10-CM

## 2022-11-17 DIAGNOSIS — Z3A21 21 weeks gestation of pregnancy: Secondary | ICD-10-CM

## 2022-11-17 DIAGNOSIS — O09299 Supervision of pregnancy with other poor reproductive or obstetric history, unspecified trimester: Secondary | ICD-10-CM

## 2022-11-17 NOTE — Progress Notes (Signed)
Pt reports lower pelvic pain

## 2022-11-17 NOTE — Progress Notes (Signed)
   PRENATAL VISIT NOTE  Subjective:  Alexis Richardson is a 21 y.o. G2P1001 at [redacted]w[redacted]d being seen today for ongoing prenatal care.  She is currently monitored for the following issues for this high-risk pregnancy and has History of pulmonary embolus (PE); Rh negative state in antepartum period; Supervision of high risk pregnancy, antepartum; History of gestational diabetes in prior pregnancy, currently pregnant; History of gestational hypertension; History of bilateral breast reduction surgery; and Atypical squamous cell changes of undetermined significance (ASCUS) on cervical cytology with negative high risk human papilloma virus (HPV) test result on their problem list.  Patient doing well with no acute concerns today. She reports  occasional   lower pelvic pain which appears c/w round ligament pain  Contractions: Not present. Vag. Bleeding: None.  Movement: Present. Denies leaking of fluid.   The following portions of the patient's history were reviewed and updated as appropriate: allergies, current medications, past family history, past medical history, past social history, past surgical history and problem list. Problem list updated.  Objective:   Vitals:   11/17/22 1440  BP: 118/76  Pulse: (!) 105  Weight: 234 lb 8 oz (106.4 kg)    Fetal Status: Fetal Heart Rate (bpm): 158 Fundal Height: 21 cm Movement: Present     General:  Alert, oriented and cooperative. Patient is in no acute distress.  Skin: Skin is warm and dry. No rash noted.   Cardiovascular: Normal heart rate noted  Respiratory: Normal respiratory effort, no problems with respiration noted  Abdomen: Soft, gravid, appropriate for gestational age.  Pain/Pressure: Present     Pelvic: Cervical exam deferred        Extremities: Normal range of motion.  Edema: None  Mental Status:  Normal mood and affect. Normal behavior. Normal judgment and thought content.   Assessment and Plan:  Pregnancy: G2P1001 at [redacted]w[redacted]d  1. [redacted] weeks gestation  of pregnancy   2. Supervision of high risk pregnancy, antepartum Continue routine care  3. Rh negative state in antepartum period Rhogam at 28 weeks and after delivery  4. History of pulmonary embolus (PE) Pt notes compliance with lovenox  5. History of gestational hypertension BP WNL, will obtain p/c ratio baseline today - Protein / creatinine ratio, urine  6. History of gestational diabetes in prior pregnancy, currently pregnant Check at 26-28 weeks  Preterm labor symptoms and general obstetric precautions including but not limited to vaginal bleeding, contractions, leaking of fluid and fetal movement were reviewed in detail with the patient.  Please refer to After Visit Summary for other counseling recommendations.   Return in about 4 weeks (around 12/15/2022) for [facility], in person.   Mariel Aloe, MD Faculty Attending Center for [facility]

## 2022-11-18 LAB — PROTEIN / CREATININE RATIO, URINE
Creatinine, Urine: 201.2 mg/dL
Protein, Ur: 28.6 mg/dL
Protein/Creat Ratio: 142 mg/g creat (ref 0–200)

## 2022-12-06 NOTE — L&D Delivery Note (Signed)
OB/GYN Faculty Practice Delivery Note  Raelene Nepper is a 22 y.o. [id number] s/p VD at [redacted]w[redacted]d. She was admitted for IOL h/o PE.   ROM: 4h 50m with clear fluid GBS Status:  Negative/-- (04/03 1530) Maximum Maternal Temperature: 98.34F  Labor Progress: Initial SVE: 4/50/-3. She then progressed to complete.   Delivery Date/Time: 03/19/23 1545 Delivery: Called to room and patient was complete and pushing. Head delivered LOA. No nuchal cord present. Shoulder and body delivered in usual fashion. Infant with spontaneous cry, placed on mother's abdomen, dried and stimulated. Cord clamped x 2 after 1-minute delay, and cut by FOB. Cord blood drawn. Placenta delivered spontaneously with gentle cord traction. Fundus firm with massage and Pitocin. Labia, perineum, vagina, and cervix inspected with no lacerations.  Baby Weight: pending  Placenta: 3 vessel, intact. Sent to L&D Complications: None Lacerations: none EBL: 86 mL Analgesia: Epidural   Infant:  APGAR (1 MIN):  8 APGAR (5 MINS):  9  Myrtie Hawk, DO OB Family Medicine Fellow, [facility] for [employer], [facility] 03/19/2023, 3:56 PM

## 2022-12-15 ENCOUNTER — Encounter: Payer: Self-pay | Admitting: Obstetrics and Gynecology

## 2022-12-15 ENCOUNTER — Other Ambulatory Visit: Payer: Self-pay

## 2022-12-15 ENCOUNTER — Ambulatory Visit (INDEPENDENT_AMBULATORY_CARE_PROVIDER_SITE_OTHER): Payer: Medicaid Other | Admitting: Obstetrics and Gynecology

## 2022-12-15 VITALS — BP 133/82 | HR 89 | Wt 237.7 lb

## 2022-12-15 DIAGNOSIS — O26892 Other specified pregnancy related conditions, second trimester: Secondary | ICD-10-CM

## 2022-12-15 DIAGNOSIS — O26899 Other specified pregnancy related conditions, unspecified trimester: Secondary | ICD-10-CM

## 2022-12-15 DIAGNOSIS — Z7901 Long term (current) use of anticoagulants: Secondary | ICD-10-CM

## 2022-12-15 DIAGNOSIS — R102 Pelvic and perineal pain: Secondary | ICD-10-CM

## 2022-12-15 DIAGNOSIS — Z8759 Personal history of other complications of pregnancy, childbirth and the puerperium: Secondary | ICD-10-CM

## 2022-12-15 DIAGNOSIS — O0990 Supervision of high risk pregnancy, unspecified, unspecified trimester: Secondary | ICD-10-CM

## 2022-12-15 DIAGNOSIS — Z3A25 25 weeks gestation of pregnancy: Secondary | ICD-10-CM

## 2022-12-15 DIAGNOSIS — Z86711 Personal history of pulmonary embolism: Secondary | ICD-10-CM

## 2022-12-15 DIAGNOSIS — Z6791 Unspecified blood type, Rh negative: Secondary | ICD-10-CM

## 2022-12-15 NOTE — Progress Notes (Signed)
   PRENATAL VISIT NOTE  Subjective:  Alexis Richardson is a 22 y.o. G2P1001 at [redacted]w[redacted]d being seen today for ongoing prenatal care.  She is currently monitored for the following issues for this high-risk pregnancy and has History of pulmonary embolus (PE); Rh negative state in antepartum period; Supervision of high risk pregnancy, antepartum; History of gestational hypertension; History of bilateral breast reduction surgery; Atypical squamous cell changes of undetermined significance (ASCUS) on cervical cytology with negative high risk human papilloma virus (HPV) test result; and Anticoagulated on their problem list.  Patient reports  low back and pelvic pain .  Contractions: Not present. Vag. Bleeding: None.  Movement: Present. Denies leaking of fluid.   The following portions of the patient's history were reviewed and updated as appropriate: allergies, current medications, past family history, past medical history, past social history, past surgical history and problem list.   Objective:   Vitals:   12/15/22 1116  BP: 133/82  Pulse: 89  Weight: 237 lb 11.2 oz (107.8 kg)    Fetal Status: Fetal Heart Rate (bpm): 155   Movement: Present     General:  Alert, oriented and cooperative. Patient is in no acute distress.  Skin: Skin is warm and dry. No rash noted.   Cardiovascular: Normal heart rate noted  Respiratory: Normal respiratory effort, no problems with respiration noted  Abdomen: Soft, gravid, appropriate for gestational age.  Pain/Pressure: Present     Pelvic: Cervical exam deferred        Extremities: Normal range of motion.  Edema: None  Mental Status: Normal mood and affect. Normal behavior. Normal judgment and thought content.   Assessment and Plan:  Pregnancy: G2P1001 at [redacted]w[redacted]d 1. Supervision of high risk pregnancy, antepartum 28wk labs next visit - Ambulatory referral to Physical Therapy  2. Pelvic pain during pregnancy in second trimester, antepartum - Ambulatory referral to  Physical Therapy  3. Rh negative state in antepartum period Rhogam and ab screen next visit  4. History of pulmonary embolus (PE) Continue on ppx lovenox and for 6wks pp  5. History of gestational hypertension Continue low dose asa.   6. Anticoagulated F/u 1/23 mfm suveillance growth u/s  Preterm labor symptoms and general obstetric precautions including but not limited to vaginal bleeding, contractions, leaking of fluid and fetal movement were reviewed in detail with the patient. Please refer to After Visit Summary for other counseling recommendations.   Return in about 2 weeks (around 12/29/2022) for in person, low risk ob, fasting 2hr GTT.  Future Appointments  Date Time Provider [place]  12/22/2022 12:30 PM Isabel Caprice, [place] [facility] None  12/28/2022  2:30 PM [facility] NURSE [facility]  12/28/2022  2:45 PM [facility] US5 [facility]  12/29/2022 10:45 AM Camillia Herter, [place] [facility] None  12/31/2022  8:50 AM [facility] LAB [facility]  12/31/2022  9:15 AM Chancy Milroy, MD [facility]    Aletha Halim, MD

## 2022-12-20 ENCOUNTER — Encounter: Payer: Self-pay | Admitting: *Deleted

## 2022-12-22 ENCOUNTER — Ambulatory Visit: Payer: Medicaid Other

## 2022-12-22 NOTE — Therapy (Incomplete)
OUTPATIENT PHYSICAL THERAPY THORACOLUMBAR EVALUATION   Patient Name: Alexis Richardson MRN: [id number] DOB:[birth year], 22 y.o., female Today's Date: 12/22/2022  END OF SESSION:   Past Medical History:  Diagnosis Date   Gestational diabetes 03/26/2019   Neg postpartum GTT   Gestational hypertension 05/03/2019   Pulmonary embolism ([place])    a. Dx 09/2017, had IUD removed in case this contributed.   Right ventricular dilation    a. at time of PE 09/2017>11/2027 echo wnl   Past Surgical History:  Procedure Laterality Date   BREAST REDUCTION SURGERY Bilateral 01/05/2021   Procedure: BILATERAL MAMMARY REDUCTION;  Surgeon: Cindra Presume, MD;  Location: [facility];  Service: Plastics;  Laterality: Bilateral;   KELOID EXCISION     Patient Active Problem List   Diagnosis Date Noted   Anticoagulated 12/15/2022   Atypical squamous cell changes of undetermined significance (ASCUS) on cervical cytology with negative high risk human papilloma virus (HPV) test result 09/13/2022   History of gestational hypertension 09/07/2022   History of bilateral breast reduction surgery 09/07/2022   Supervision of high risk pregnancy, antepartum 09/01/2022   Rh negative state in antepartum period 10/09/2018   History of pulmonary embolus (PE)     PCP: Nolene Ebbs, MD   REFERRING PROVIDER: Aletha Halim, MD  REFERRING DIAG: O58.90 (ICD-10-CM) - Supervision of high risk pregnancy, antepartum O26.892,R10.2 (ICD-10-CM) - Pelvic pain during pregnancy in second trimester, antepartum  Rationale for Evaluation and Treatment: Rehabilitation  THERAPY DIAG:  Other low back pain  Cramp and spasm  Muscle weakness (generalized)  Difficulty in walking, not elsewhere classified  ONSET DATE: 12/15/2022  SUBJECTIVE:                                                                                                                                                                                            SUBJECTIVE STATEMENT: ***  PERTINENT HISTORY:  ***  PAIN:  Are you having pain? Yes: NPRS scale: ***/10 Pain location: *** Pain description: *** Aggravating factors: *** Relieving factors: ***  PRECAUTIONS: Other: pregnancy  WEIGHT BEARING RESTRICTIONS: No  FALLS:  Has patient fallen in last 6 months? No  LIVING ENVIRONMENT: Lives with: {[facility] lives with:25569::"lives with their family"} Lives in: {Lives in:25570} Stairs: {opstairs:27293} Has following equipment at home: {Assistive devices:23999}  OCCUPATION: ***  PLOF: {PLOF:24004}  PATIENT GOALS: ***  NEXT MD VISIT: prn  OBJECTIVE:   DIAGNOSTIC FINDINGS:  ***  PATIENT SURVEYS:  Modified Oswestry ***   SCREENING FOR RED FLAGS: Bowel or bladder incontinence: {Yes/No:304960894} Spinal tumors: {Yes/No:304960894} Cauda equina syndrome: {Yes/No:304960894} Compression fracture: {Yes/No:304960894} Abdominal aneurysm: {Yes/No:304960894}  COGNITION: Overall  cognitive status: {cognition:24006}     SENSATION: WFL  MUSCLE LENGTH: Hamstrings: Right *** deg; Left *** deg Maisie Fus test: Right *** deg; Left *** deg  POSTURE: {posture:25561}  PALPATION: ***  LUMBAR ROM:   AROM eval  Flexion   Extension   Right lateral flexion   Left lateral flexion   Right rotation   Left rotation    (Blank rows = not tested)  LOWER EXTREMITY ROM:     Active  Right eval Left eval  Hip flexion    Hip extension    Hip abduction    Hip adduction    Hip internal rotation    Hip external rotation    Knee flexion    Knee extension    Ankle dorsiflexion    Ankle plantarflexion    Ankle inversion    Ankle eversion     (Blank rows = not tested)  LOWER EXTREMITY MMT:    MMT Right eval Left eval  Hip flexion    Hip extension    Hip abduction    Hip adduction    Hip internal rotation    Hip external rotation    Knee flexion    Knee extension    Ankle dorsiflexion    Ankle plantarflexion    Ankle  inversion    Ankle eversion     (Blank rows = not tested)  LUMBAR SPECIAL TESTS:  {lumbar special test:25242}  FUNCTIONAL TESTS:  5 times sit to stand: *** Timed up and go (TUG): ***  GAIT: Distance walked: 50 Assistive device utilized: None Level of assistance: Complete Independence Comments: Slightly antalgic  TODAY'S TREATMENT:                                                                                                                              DATE: 12/22/22 Initial eval completed and initiated HEP (any treatment provided today was not billed due to insurance restrictions)   PATIENT EDUCATION:  Education details: Initiated HEP Person educated: Patient Education method: Programmer, multimedia, Facilities manager, Verbal cues, and Handouts Education comprehension: verbalized understanding, returned demonstration, and verbal cues required  HOME EXERCISE PROGRAM: ***  ASSESSMENT:  CLINICAL IMPRESSION: Patient is a 22 y.o. female who was seen today for physical therapy evaluation and treatment for antepartum low back and pelvic pain. She presents with  OBJECTIVE IMPAIRMENTS: decreased mobility, difficulty walking, decreased ROM, decreased strength, hypomobility, increased fascial restrictions, increased muscle spasms, impaired flexibility, and pain.   ACTIVITY LIMITATIONS: carrying, lifting, bending, sitting, standing, squatting, sleeping, transfers, and dressing  PARTICIPATION LIMITATIONS: meal prep, cleaning, laundry, interpersonal relationship, driving, shopping, community activity, occupation, and yard work  PERSONAL FACTORS: Fitness, Past/current experiences, and 1 comorbidity: pregnancy  are also affecting patient's functional outcome.   REHAB POTENTIAL: Good  CLINICAL DECISION MAKING: Stable/uncomplicated  EVALUATION COMPLEXITY: Low   GOALS: Goals reviewed with patient? Yes  SHORT TERM GOALS: Target date: 01/19/2023    Pain report to be no greater than 4/10  Baseline: Goal status: INITIAL  2.  Patient will be independent with initial HEP  Baseline:  Goal status: INITIAL   LONG TERM GOALS: Target date: 02/16/2023    Patient to report pain no greater than 2/10  Baseline:  Goal status: INITIAL  2.  Patient to be independent with advanced HEP  Baseline:  Goal status: INITIAL  3.  Patient to report 85% improvement in overall symptoms  Baseline:  Goal status: INITIAL  4.  Patient to be able to sleep through the night  Baseline:  Goal status: INITIAL  5.  *** Baseline:  Goal status: {GOALSTATUS:25110}  6.  *** Baseline:  Goal status: {GOALSTATUS:25110}  PLAN:  PT FREQUENCY: 1-2x/week  PT DURATION: 8 weeks  PLANNED INTERVENTIONS: Therapeutic exercises, Therapeutic activity, Neuromuscular re-education, Balance training, Gait training, Patient/Family education, Self Care, Joint mobilization, Stair training, Aquatic Therapy, Dry Needling, Spinal mobilization, Cryotherapy, Moist heat, Splintting, Taping, Ultrasound, Manual therapy, and Re-evaluation.  PLAN FOR NEXT SESSION: Nustep, Review HEP, pelvic floor and core strengthening, hip mobility and strengthening   Isabel Caprice, PT 12/22/2022, 10:28 AM

## 2022-12-28 ENCOUNTER — Ambulatory Visit: Payer: Medicaid Other

## 2022-12-28 ENCOUNTER — Ambulatory Visit: Payer: Medicaid Other | Attending: Obstetrics and Gynecology

## 2022-12-28 ENCOUNTER — Other Ambulatory Visit: Payer: Self-pay

## 2022-12-28 VITALS — BP 120/75 | HR 88

## 2022-12-28 DIAGNOSIS — Z8632 Personal history of gestational diabetes: Secondary | ICD-10-CM | POA: Diagnosis present

## 2022-12-28 DIAGNOSIS — O0990 Supervision of high risk pregnancy, unspecified, unspecified trimester: Secondary | ICD-10-CM

## 2022-12-28 DIAGNOSIS — O360120 Maternal care for anti-D [Rh] antibodies, second trimester, not applicable or unspecified: Secondary | ICD-10-CM

## 2022-12-28 DIAGNOSIS — Z6791 Unspecified blood type, Rh negative: Secondary | ICD-10-CM

## 2022-12-28 DIAGNOSIS — O99212 Obesity complicating pregnancy, second trimester: Secondary | ICD-10-CM | POA: Insufficient documentation

## 2022-12-28 DIAGNOSIS — O26899 Other specified pregnancy related conditions, unspecified trimester: Secondary | ICD-10-CM | POA: Insufficient documentation

## 2022-12-28 DIAGNOSIS — E669 Obesity, unspecified: Secondary | ICD-10-CM

## 2022-12-28 DIAGNOSIS — Z3A27 27 weeks gestation of pregnancy: Secondary | ICD-10-CM

## 2022-12-28 DIAGNOSIS — Z8759 Personal history of other complications of pregnancy, childbirth and the puerperium: Secondary | ICD-10-CM

## 2022-12-28 DIAGNOSIS — Z86711 Personal history of pulmonary embolism: Secondary | ICD-10-CM

## 2022-12-28 DIAGNOSIS — Z362 Encounter for other antenatal screening follow-up: Secondary | ICD-10-CM

## 2022-12-28 DIAGNOSIS — O09292 Supervision of pregnancy with other poor reproductive or obstetric history, second trimester: Secondary | ICD-10-CM

## 2022-12-28 DIAGNOSIS — O09299 Supervision of pregnancy with other poor reproductive or obstetric history, unspecified trimester: Secondary | ICD-10-CM | POA: Diagnosis present

## 2022-12-28 DIAGNOSIS — O88812 Other embolism in pregnancy, second trimester: Secondary | ICD-10-CM | POA: Diagnosis not present

## 2022-12-28 DIAGNOSIS — O99213 Obesity complicating pregnancy, third trimester: Secondary | ICD-10-CM

## 2022-12-29 ENCOUNTER — Encounter: Payer: Self-pay | Admitting: Plastic Surgery

## 2022-12-29 ENCOUNTER — Ambulatory Visit: Payer: Medicaid Other | Admitting: Plastic Surgery

## 2022-12-29 ENCOUNTER — Other Ambulatory Visit: Payer: Self-pay

## 2022-12-29 ENCOUNTER — Encounter: Payer: Self-pay | Admitting: Physical Therapy

## 2022-12-29 ENCOUNTER — Ambulatory Visit: Payer: Medicaid Other | Attending: Obstetrics and Gynecology | Admitting: Physical Therapy

## 2022-12-29 VITALS — BP 132/81 | HR 103 | Ht 64.0 in | Wt 239.2 lb

## 2022-12-29 DIAGNOSIS — R1032 Left lower quadrant pain: Secondary | ICD-10-CM | POA: Diagnosis present

## 2022-12-29 DIAGNOSIS — O26892 Other specified pregnancy related conditions, second trimester: Secondary | ICD-10-CM | POA: Diagnosis present

## 2022-12-29 DIAGNOSIS — R102 Pelvic and perineal pain: Secondary | ICD-10-CM | POA: Diagnosis present

## 2022-12-29 DIAGNOSIS — M5459 Other low back pain: Secondary | ICD-10-CM | POA: Insufficient documentation

## 2022-12-29 DIAGNOSIS — L910 Hypertrophic scar: Secondary | ICD-10-CM

## 2022-12-29 DIAGNOSIS — O0990 Supervision of high risk pregnancy, unspecified, unspecified trimester: Secondary | ICD-10-CM | POA: Diagnosis not present

## 2022-12-29 DIAGNOSIS — R1031 Right lower quadrant pain: Secondary | ICD-10-CM

## 2022-12-29 NOTE — Progress Notes (Signed)
Alexis Richardson returns today for reevaluation of her hypertrophic scars.  She had previously undergone a bilateral breast reduction and developed hypertrophic scars along the lateral borders of the inframammary incision.  These were injected with steroids and she has had an overall good result however she still has 2 small areas on the left incision which have not completely flattened.  Of note the patient is currently pregnant with 2 months to go until she delivers.  She is a high risk pregnancy and is on blood thinners at this time.  I agree that injecting the scars is reasonable however I think this should be done after delivery even though the risk of steroid injection is quite low I do not want to potentially complicate her already high risk pregnancy.  She understands this and will return to see me in 2 months

## 2022-12-29 NOTE — Therapy (Signed)
OUTPATIENT PHYSICAL THERAPY THORACOLUMBAR EVALUATION   Patient Name: Alexis Richardson MRN: [id number] DOB:23-Sep-2001, 22 y.o., female Today's Date: 12/29/2022  END OF SESSION:  PT End of Session - 12/29/22 0859     Visit Number 1    Date for PT Re-Evaluation 03/23/23    Authorization Type Medicaid [employer]    PT Start Time 0855    PT Stop Time 0930    PT Time Calculation (min) 35 min    Activity Tolerance Patient tolerated treatment well             Past Medical History:  Diagnosis Date   Gestational diabetes 03/26/2019   Neg postpartum GTT   Gestational hypertension 05/03/2019   Pulmonary embolism ([facility])    a. Dx 09/2017, had IUD removed in case this contributed.   Right ventricular dilation    a. at time of PE 09/2017>11/2027 echo wnl   Past Surgical History:  Procedure Laterality Date   BREAST REDUCTION SURGERY Bilateral 01/05/2021   Procedure: BILATERAL MAMMARY REDUCTION;  Surgeon: [name], MD;  Location: [facility];  Service: Plastics;  Laterality: Bilateral;   KELOID EXCISION     Patient Active Problem List   Diagnosis Date Noted   Anticoagulated 12/15/2022   Atypical squamous cell changes of undetermined significance (ASCUS) on cervical cytology with negative high risk human papilloma virus (HPV) test result 09/13/2022   History of gestational hypertension 09/07/2022   History of bilateral breast reduction surgery 09/07/2022   Supervision of high risk pregnancy, antepartum 09/01/2022   Rh negative state in antepartum period 10/09/2018   History of pulmonary embolus (PE)     PCP: Fleet Contras MD  REFERRING PROVIDER: [place] Bing MD  REFERRING DIAG: pelvic pain during pregnancy in 2nd trimester  Rationale for Evaluation and Treatment: Rehabilitation  THERAPY DIAG:  Pelvic pain ONSET DATE: 08/06/2022  SUBJECTIVE:                                                                                                                                                                                            SUBJECTIVE STATEMENT: Patient is in her 2nd trimester of pregnancy and reports Bil groin pain;  hard to go to class and had to switch to virtual; didn't have with first pregnancy;  baby measuring big per U/S and head down.  BP is good.  Active this pregnancy and walking regularly;  feels good while walking  PERTINENT HISTORY:  Due date April History of pulmonary embolus (PE); Rh negative state in antepartum period; Supervision of high risk pregnancy, antepartum; History of gestational hypertension; gestational diabetes Has a 22 year old  dtr at home PAIN:  PAIN:  Are you having pain? Yes NPRS scale: 5-6/10 Pain location: bil groin   Aggravating factors: sitting too long or lying down Relieving factors: stretch knees apart   PRECAUTIONS: Other: pregnancy high risk  WEIGHT BEARING RESTRICTIONS: No  FALLS:  Has patient fallen in last 6 months? No    OCCUPATION: sitting for work and school  PLOF: Independent  PATIENT GOALS: ex's to relieve pain and pressure    OBJECTIVE:    PATIENT SURVEYS:  Modified Oswestry 44%     COGNITION: Overall cognitive status: Within functional limits for tasks assessed      POSTURE: No Significant postural limitations   LUMBAR ROM:   AROM eval  Flexion WFLs  Extension 15 "feels good"  Right lateral flexion 20  Left lateral flexion 20  Right rotation   Left rotation    (Blank rows = not tested) TRUNK STRENGTH:   abdominals 4-/5; decreased activation of lumbar multifidi; trunk extensors 4-/5  LOWER EXTREMITY ROM:   decreased hip adductor and hip rotator muscle lengths  LOWER EXTREMITY MMT:  grossly WNLs   FUNCTIONAL TESTS:  Antalgia with sit to stand but able to do without UE assist  GAIT: Comments: WNLS  TODAY'S TREATMENT:                                                                                                                              DATE: 1/24     PATIENT EDUCATION:  Education details: Educated patient on anatomy and physiology of current symptoms, prognosis, plan of care as well as initial self care strategies to promote recovery  Person educated: Patient Education method: Explanation, Demonstration, and Handouts Education comprehension: verbalized understanding and returned demonstration  HOME EXERCISE PROGRAM: Access Code: [email or web address] URL: [email or web address] Date: 12/29/2022 Prepared by: Ruben Im  Exercises - Supine Butterfly Groin Stretch  - 1 x daily - 7 x weekly - 1 sets - 3-5 reps - Supported Occupational psychologist with Pelvic Floor Relaxation  - 1 x daily - 7 x weekly - 1 sets - 3-5 reps - Supine Hip Internal and External Rotation  - 1 x daily - 7 x weekly - 1 sets - 10 reps - Rock  - 1 x daily - 7 x weekly - 1 sets - 10 reps - Seated Hip Adductor Stretch  - 1 x daily - 7 x weekly - 1 sets - 3-5 reps - 20 hold - Seated Hip Adductor Stretch on Swiss Ball  - 1 x daily - 7 x weekly - 1 sets - 10 reps  ASSESSMENT:  CLINICAL IMPRESSION: Patient is a 22 y.o. female who was seen today for physical therapy evaluation and treatment for pelvic pain related to pregnancy.  Reports of bil groin pain with prolonged sitting or lying down.  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  Good response to muscle lengthening and pelvic mobility ex's.  She would  benefit from patient education, therapeutic ex and manual interventions as needed to address these issues, preparation for child birth and taking care of a newborn.    OBJECTIVE IMPAIRMENTS: decreased ROM, decreased strength, impaired perceived functional ability, impaired flexibility, and pain.   ACTIVITY LIMITATIONS: sitting, standing, sleeping, and caring for others  PARTICIPATION LIMITATIONS: occupation and school  PERSONAL FACTORS:  high risk pregnancy secondary to history of pulmonary embolus, gestational HTN and diabetes  are also affecting patient's  functional outcome.   REHAB POTENTIAL: Good  CLINICAL DECISION MAKING: Stable/uncomplicated  EVALUATION COMPLEXITY: Low   GOALS: Goals reviewed with patient? Yes  SHORT TERM GOALS: Target date: 01/26/2023    The patient will demonstrate knowledge of basic self care strategies and exercises to promote healing   Baseline: Goal status: INITIAL   LONG TERM GOALS: Target date: 03/23/2023     The patient will be independent in a safe self progression of a home exercise program to promote further recovery of function  and preparation for childbirth Baseline:  Goal status: INITIAL  2.  The patient will report a 40% improvement in pain levels with sitting and lying down Baseline:  Goal status: INITIAL  3.  The patient will be able to sit for 30 increments for school and work Baseline:  Goal status: INITIAL  4.  Modified Oswestry improved to 35% indicating improved function with less pain Baseline:  Goal status: INITIAL   PLAN:  PT FREQUENCY: every other week  PT DURATION: 12 weeks  PLANNED INTERVENTIONS: Therapeutic exercises, Therapeutic activity, Neuromuscular re-education, Patient/Family education, Self Care, Joint mobilization, Aquatic Therapy, Spinal mobilization, Manual therapy, and Re-evaluation.  PLAN FOR NEXT SESSION: add standing trunk extension; see if pt got an ex ball and add pelvic mobility;  hip adductor and hip flexor muscle lengthening; lumbo/pelvic/hip stabilization  Ruben Im, PT 12/29/22 4:45 PM Phone: [phone] Fax: [phone]

## 2022-12-30 ENCOUNTER — Other Ambulatory Visit: Payer: Self-pay

## 2022-12-30 DIAGNOSIS — O0990 Supervision of high risk pregnancy, unspecified, unspecified trimester: Secondary | ICD-10-CM

## 2022-12-31 ENCOUNTER — Other Ambulatory Visit: Payer: Medicaid Other

## 2022-12-31 ENCOUNTER — Ambulatory Visit (INDEPENDENT_AMBULATORY_CARE_PROVIDER_SITE_OTHER): Payer: Medicaid Other | Admitting: Obstetrics and Gynecology

## 2022-12-31 ENCOUNTER — Other Ambulatory Visit: Payer: Self-pay

## 2022-12-31 VITALS — BP 117/67 | HR 111 | Wt 239.9 lb

## 2022-12-31 DIAGNOSIS — Z3182 Encounter for Rh incompatibility status: Secondary | ICD-10-CM | POA: Diagnosis not present

## 2022-12-31 DIAGNOSIS — Z3A27 27 weeks gestation of pregnancy: Secondary | ICD-10-CM | POA: Diagnosis not present

## 2022-12-31 DIAGNOSIS — Z6791 Unspecified blood type, Rh negative: Secondary | ICD-10-CM | POA: Diagnosis not present

## 2022-12-31 DIAGNOSIS — O0990 Supervision of high risk pregnancy, unspecified, unspecified trimester: Secondary | ICD-10-CM

## 2022-12-31 DIAGNOSIS — O26893 Other specified pregnancy related conditions, third trimester: Secondary | ICD-10-CM | POA: Diagnosis not present

## 2022-12-31 DIAGNOSIS — Z9889 Other specified postprocedural states: Secondary | ICD-10-CM

## 2022-12-31 DIAGNOSIS — O0993 Supervision of high risk pregnancy, unspecified, third trimester: Secondary | ICD-10-CM

## 2022-12-31 DIAGNOSIS — Z8759 Personal history of other complications of pregnancy, childbirth and the puerperium: Secondary | ICD-10-CM

## 2022-12-31 DIAGNOSIS — Z86711 Personal history of pulmonary embolism: Secondary | ICD-10-CM

## 2022-12-31 MED ORDER — CYCLOBENZAPRINE HCL 10 MG PO TABS: 10.0000 mg | ORAL_TABLET | Freq: Three times a day (TID) | ORAL | 2 refills | Status: DC | PRN

## 2022-12-31 MED ORDER — RHO D IMMUNE GLOBULIN 1500 UNIT/2ML IJ SOSY
300.0000 ug | PREFILLED_SYRINGE | Freq: Once | INTRAMUSCULAR | Status: AC
  Administered 2022-12-31: 300 ug via INTRAMUSCULAR

## 2022-12-31 NOTE — Addendum Note (Signed)
Addended by: Donell Beers T on: 12/31/2022 10:37 AM   Modules accepted: Orders

## 2022-12-31 NOTE — Patient Instructions (Signed)

## 2022-12-31 NOTE — Progress Notes (Signed)
Subjective:  Alexis Richardson is a 22 y.o. G2P1001 at [redacted]w[redacted]d being seen today for ongoing prenatal care.  She is currently monitored for the following issues for this high-risk pregnancy and has History of pulmonary embolus (PE); Rh negative state in antepartum period; Supervision of high risk pregnancy, antepartum; History of gestational hypertension; History of bilateral breast reduction surgery; Atypical squamous cell changes of undetermined significance (ASCUS) on cervical cytology with negative high risk human papilloma virus (HPV) test result; and Anticoagulated on their problem list.  Patient reports backache.  Contractions: Not present. Vag. Bleeding: None.  Movement: Present. Denies leaking of fluid.   The following portions of the patient's history were reviewed and updated as appropriate: allergies, current medications, past family history, past medical history, past social history, past surgical history and problem list. Problem list updated.  Objective:   Vitals:   12/31/22 0921  BP: 117/67  Pulse: (!) 111  Weight: 239 lb 14.4 oz (108.8 kg)    Fetal Status: Fetal Heart Rate (bpm): 157   Movement: Present     General:  Alert, oriented and cooperative. Patient is in no acute distress.  Skin: Skin is warm and dry. No rash noted.   Cardiovascular: Normal heart rate noted  Respiratory: Normal respiratory effort, no problems with respiration noted  Abdomen: Soft, gravid, appropriate for gestational age. Pain/Pressure: Present     Pelvic:  Cervical exam deferred        Extremities: Normal range of motion.  Edema: None  Mental Status: Normal mood and affect. Normal behavior. Normal judgment and thought content.   Urinalysis:      Assessment and Plan:  Pregnancy: G2P1001 at [redacted]w[redacted]d  1. Supervision of high risk pregnancy, antepartum Stable 28 week labs today Continue with PT for back ache - cyclobenzaprine (FLEXERIL) 10 MG tablet; Take 1 tablet (10 mg total) by mouth 3 (three) times  daily as needed for muscle spasms.  Dispense: 30 tablet; Refill: 2  2. Rh negative state in antepartum period Rhogam today  3. History of pulmonary embolus (PE) Stable Continue with Lovenox and 6 weeks PP as week Serial growth scans as per MFM  4. History of gestational hypertension BP stable No S/Sx at present   5. History of bilateral breast reduction surgery Will refer for lactation consult  Preterm labor symptoms and general obstetric precautions including but not limited to vaginal bleeding, contractions, leaking of fluid and fetal movement were reviewed in detail with the patient. Please refer to After Visit Summary for other counseling recommendations.  Return in about 2 weeks (around 01/14/2023) for OB visit, face to face, MD only.   Chancy Milroy, MD

## 2023-01-01 LAB — CBC
Hematocrit: 33 % — ABNORMAL LOW (ref 34.0–46.6)
Hemoglobin: 10.9 g/dL — ABNORMAL LOW (ref 11.1–15.9)
MCH: 28.4 pg (ref 26.6–33.0)
MCHC: 33 g/dL (ref 31.5–35.7)
MCV: 86 fL (ref 79–97)
Platelets: 201 10*3/uL (ref 150–450)
RBC: 3.84 x10E6/uL (ref 3.77–5.28)
RDW: 12.6 % (ref 11.7–15.4)
WBC: 9.1 10*3/uL (ref 3.4–10.8)

## 2023-01-01 LAB — GLUCOSE TOLERANCE, 2 HOURS W/ 1HR
Glucose, 1 hour: 162 mg/dL (ref 70–179)
Glucose, 2 hour: 107 mg/dL (ref 70–152)
Glucose, Fasting: 90 mg/dL (ref 70–91)

## 2023-01-01 LAB — RPR: RPR Ser Ql: NONREACTIVE

## 2023-01-01 LAB — ANTIBODY SCREEN: Antibody Screen: NEGATIVE

## 2023-01-01 LAB — HIV ANTIBODY (ROUTINE TESTING W REFLEX): HIV Screen 4th Generation wRfx: NONREACTIVE

## 2023-01-18 ENCOUNTER — Ambulatory Visit (INDEPENDENT_AMBULATORY_CARE_PROVIDER_SITE_OTHER): Payer: Medicaid Other | Admitting: Lactation Services

## 2023-01-18 ENCOUNTER — Ambulatory Visit: Payer: Medicaid Other | Admitting: Advanced Practice Midwife

## 2023-01-18 ENCOUNTER — Other Ambulatory Visit: Payer: Self-pay

## 2023-01-18 VITALS — BP 127/82 | HR 103 | Wt 238.0 lb

## 2023-01-18 DIAGNOSIS — Z9889 Other specified postprocedural states: Secondary | ICD-10-CM | POA: Diagnosis not present

## 2023-01-18 DIAGNOSIS — Z86711 Personal history of pulmonary embolism: Secondary | ICD-10-CM

## 2023-01-18 DIAGNOSIS — Z3A30 30 weeks gestation of pregnancy: Secondary | ICD-10-CM

## 2023-01-18 DIAGNOSIS — O26893 Other specified pregnancy related conditions, third trimester: Secondary | ICD-10-CM

## 2023-01-18 DIAGNOSIS — O0993 Supervision of high risk pregnancy, unspecified, third trimester: Secondary | ICD-10-CM

## 2023-01-18 DIAGNOSIS — Z6791 Unspecified blood type, Rh negative: Secondary | ICD-10-CM

## 2023-01-18 DIAGNOSIS — O0990 Supervision of high risk pregnancy, unspecified, unspecified trimester: Secondary | ICD-10-CM

## 2023-01-18 DIAGNOSIS — Z8759 Personal history of other complications of pregnancy, childbirth and the puerperium: Secondary | ICD-10-CM

## 2023-01-18 NOTE — Progress Notes (Signed)
Patient in office while pregnant. She breast fed 1st child for 6 months with a good milk supply.   She has a history of a breast reduction in January 2022 after her first child.  She reports she was asked if was planning to breast feed prior to surgery, she is unsure if tissue sparing was preformed. She has [phone] grams removed from each breasts. She [place] unsure of size change as wears sports bras usually.   Patient reports her nipple was completely removed and has an periareolar anchor scar. She reports nipple sensation has returned and may be more than previously.   Reviewed will need to watch infant closely for weight loss after birth and that donor milk or formula as needed. Reviewed would recommend early and frequent OP follow up to monitor mom and infant status.   Reviewed bfar website. Reviewed signs of severed ducts post delivery that may need to involute while other areas will still function.   Reviewed and gave information on ordering breast pump and types of pumps that may benefit her the best.   Patient to follow up with Lactation as needed and post partum.

## 2023-01-18 NOTE — Patient Instructions (Addendum)
Order breast pump through Medicaid [employer] (Spectra or [employer]if if possible) [email or web address] is available as a resource for women after breast reduction. Please seek out Lactation help in the hospital OP Lactation services are available at [phone]

## 2023-01-18 NOTE — Progress Notes (Signed)
   PRENATAL VISIT NOTE  Subjective:  Alexis Richardson is a 22 y.o. G2P1001 at 64w3dbeing seen today for ongoing prenatal care.  She is currently monitored for the following issues for this high-risk pregnancy and has History of pulmonary embolus (PE); Rh negative state in antepartum period; Supervision of high risk pregnancy, antepartum; History of gestational hypertension; History of bilateral breast reduction surgery; Atypical squamous cell changes of undetermined significance (ASCUS) on cervical cytology with negative high risk human papilloma virus (HPV) test result; and Anticoagulated on their problem list.  Patient reports no complaints.  Contractions: Not present. Vag. Bleeding: None.  Movement: Present. Denies leaking of fluid.   The following portions of the patient's history were reviewed and updated as appropriate: allergies, current medications, past family history, past medical history, past social history, past surgical history and problem list.   Objective:   Vitals:   01/18/23 1150  BP: 127/82  Pulse: (!) 103  Weight: 238 lb (108 kg)    Fetal Status: Fetal Heart Rate (bpm): 157   Movement: Present     General:  Alert, oriented and cooperative. Patient is in no acute distress.  Skin: Skin is warm and dry. No rash noted.   Cardiovascular: Normal heart rate noted  Respiratory: Normal respiratory effort, no problems with respiration noted  Abdomen: Soft, gravid, appropriate for gestational age.  Pain/Pressure: Present     Pelvic: Cervical exam deferred        Extremities: Normal range of motion.  Edema: None  Mental Status: Normal mood and affect. Normal behavior. Normal judgment and thought content.   Assessment and Plan:  Pregnancy: G2P1001 at 363w3d. Supervision of high risk pregnancy, antepartum - Routine care  2. Rh negative state in antepartum period - Rho 1/26  3. History of pulmonary embolus (PE) - Continue Lovenox  4. History of gestational hypertension -  BP stable  5. [redacted] weeks gestation of pregnancy - Considering TDaP. Info given.   Preterm labor symptoms and general obstetric precautions including but not limited to vaginal bleeding, contractions, leaking of fluid and fetal movement were reviewed in detail with the patient. Please refer to After Visit Summary for other counseling recommendations.   No follow-ups on file.  Future Appointments  Date Time Provider [place]2/21/2024  9:30 AM SiAlvera SinghPT [facility] None  02/01/2023  2:30 [facility]27[facility]20[facility]  2:45 PM [facility] US6 [facility]3/19/2024  9:30 AM SiAlvera SinghPT [facility] None    [place]

## 2023-01-18 NOTE — Patient Instructions (Signed)

## 2023-01-21 ENCOUNTER — Other Ambulatory Visit: Payer: Self-pay | Admitting: Obstetrics and Gynecology

## 2023-01-21 DIAGNOSIS — O0990 Supervision of high risk pregnancy, unspecified, unspecified trimester: Secondary | ICD-10-CM

## 2023-01-21 DIAGNOSIS — Z86711 Personal history of pulmonary embolism: Secondary | ICD-10-CM

## 2023-01-24 MED ORDER — ENOXAPARIN SODIUM 40 MG/0.4ML IJ SOSY: 40.0000 mg | PREFILLED_SYRINGE | INTRAMUSCULAR | 3 refills | Status: DC

## 2023-01-26 ENCOUNTER — Telehealth: Payer: Self-pay | Admitting: Physical Therapy

## 2023-01-26 ENCOUNTER — Ambulatory Visit: Payer: Medicaid Other | Attending: Obstetrics and Gynecology | Admitting: Physical Therapy

## 2023-01-26 DIAGNOSIS — O0990 Supervision of high risk pregnancy, unspecified, unspecified trimester: Secondary | ICD-10-CM | POA: Insufficient documentation

## 2023-01-26 DIAGNOSIS — R1032 Left lower quadrant pain: Secondary | ICD-10-CM | POA: Insufficient documentation

## 2023-01-26 DIAGNOSIS — R102 Pelvic and perineal pain: Secondary | ICD-10-CM | POA: Insufficient documentation

## 2023-01-26 DIAGNOSIS — M5459 Other low back pain: Secondary | ICD-10-CM | POA: Insufficient documentation

## 2023-01-26 DIAGNOSIS — R1031 Right lower quadrant pain: Secondary | ICD-10-CM | POA: Insufficient documentation

## 2023-01-26 DIAGNOSIS — O26892 Other specified pregnancy related conditions, second trimester: Secondary | ICD-10-CM | POA: Insufficient documentation

## 2023-01-26 NOTE — Telephone Encounter (Signed)
Left message on pt's voicemail to call facility (regarding missed appt today).  Another appt is scheduled on 3/19 at 9:30

## 2023-02-01 ENCOUNTER — Ambulatory Visit: Payer: Medicaid Other | Attending: Maternal & Fetal Medicine

## 2023-02-01 ENCOUNTER — Ambulatory Visit: Payer: Medicaid Other

## 2023-02-01 ENCOUNTER — Other Ambulatory Visit: Payer: Self-pay

## 2023-02-01 ENCOUNTER — Ambulatory Visit (INDEPENDENT_AMBULATORY_CARE_PROVIDER_SITE_OTHER): Payer: Medicaid Other | Admitting: Advanced Practice Midwife

## 2023-02-01 ENCOUNTER — Encounter: Payer: Self-pay | Admitting: Advanced Practice Midwife

## 2023-02-01 VITALS — BP 131/71 | HR 99

## 2023-02-01 VITALS — BP 118/77 | HR 99 | Wt 244.1 lb

## 2023-02-01 DIAGNOSIS — Z6791 Unspecified blood type, Rh negative: Secondary | ICD-10-CM

## 2023-02-01 DIAGNOSIS — O0990 Supervision of high risk pregnancy, unspecified, unspecified trimester: Secondary | ICD-10-CM

## 2023-02-01 DIAGNOSIS — Z23 Encounter for immunization: Secondary | ICD-10-CM

## 2023-02-01 DIAGNOSIS — E669 Obesity, unspecified: Secondary | ICD-10-CM

## 2023-02-01 DIAGNOSIS — O360130 Maternal care for anti-D [Rh] antibodies, third trimester, not applicable or unspecified: Secondary | ICD-10-CM | POA: Diagnosis not present

## 2023-02-01 DIAGNOSIS — O09293 Supervision of pregnancy with other poor reproductive or obstetric history, third trimester: Secondary | ICD-10-CM | POA: Diagnosis not present

## 2023-02-01 DIAGNOSIS — O0993 Supervision of high risk pregnancy, unspecified, third trimester: Secondary | ICD-10-CM

## 2023-02-01 DIAGNOSIS — Z3A32 32 weeks gestation of pregnancy: Secondary | ICD-10-CM

## 2023-02-01 DIAGNOSIS — Z86711 Personal history of pulmonary embolism: Secondary | ICD-10-CM | POA: Diagnosis present

## 2023-02-01 DIAGNOSIS — O99213 Obesity complicating pregnancy, third trimester: Secondary | ICD-10-CM | POA: Diagnosis present

## 2023-02-01 DIAGNOSIS — Z7901 Long term (current) use of anticoagulants: Secondary | ICD-10-CM

## 2023-02-01 DIAGNOSIS — Z8759 Personal history of other complications of pregnancy, childbirth and the puerperium: Secondary | ICD-10-CM | POA: Insufficient documentation

## 2023-02-01 DIAGNOSIS — Z8632 Personal history of gestational diabetes: Secondary | ICD-10-CM

## 2023-02-01 DIAGNOSIS — O26899 Other specified pregnancy related conditions, unspecified trimester: Secondary | ICD-10-CM | POA: Insufficient documentation

## 2023-02-01 NOTE — Progress Notes (Signed)
   PRENATAL VISIT NOTE  Subjective:  Alexis Richardson is a 22 y.o. G2P1001 at 25w3dbeing seen today for ongoing prenatal care.  She is currently monitored for the following issues for this high-risk pregnancy and has History of pulmonary embolus (PE); Rh negative state in antepartum period; Supervision of high risk pregnancy, antepartum; History of gestational hypertension; History of bilateral breast reduction surgery; Atypical squamous cell changes of undetermined significance (ASCUS) on cervical cytology with negative high risk human papilloma virus (HPV) test result; and Anticoagulated on their problem list.  Patient reports no complaints.  Contractions: Not present. Vag. Bleeding: None.  Movement: Present. Denies leaking of fluid.   The following portions of the patient's history were reviewed and updated as appropriate: allergies, current medications, past family history, past medical history, past social history, past surgical history and problem list.   Objective:   Vitals:   02/01/23 1545  BP: 118/77  Pulse: 99  Weight: 244 lb 1.6 oz (110.7 kg)    Fetal Status: Fetal Heart Rate (bpm): 146 Fundal Height: 32 cm Movement: Present     General:  Alert, oriented and cooperative. Patient is in no acute distress.  Skin: Skin is warm and dry. No rash noted.   Cardiovascular: Normal heart rate noted  Respiratory: Normal respiratory effort, no problems with respiration noted  Abdomen: Soft, gravid, appropriate for gestational age.  Pain/Pressure: Absent     Pelvic: Cervical exam deferred        Extremities: Normal range of motion.  Edema: None  Mental Status: Normal mood and affect. Normal behavior. Normal judgment and thought content.   Assessment and Plan:  Pregnancy: G2P1001 at 343w3d. Supervision of high risk pregnancy, antepartum - Tdap vaccine greater than or equal to 7yo IM  2. [redacted] weeks gestation of pregnancy - routine care - Continue Lovenox for VTE ppx.   Preterm labor  symptoms and general obstetric precautions including but not limited to vaginal bleeding, contractions, leaking of fluid and fetal movement were reviewed in detail with the patient. Please refer to After Visit Summary for other counseling recommendations.   Return in about 2 weeks (around 02/15/2023).  Future Appointments  Date Time Provider [place]3/11/2023  1:15 PM CrConcepcion LivingMD [facility]3/19/2024  9:30 AM SiAlvera SinghPT [facility] None  03/09/2023 12:30 PM [facility]SE [facility]4/02/2023 12:45 PM [facility] US4 [facility]MFCUS WMOliver SpringsNP, CNM  02/01/23  4:23 PM

## 2023-02-08 ENCOUNTER — Encounter: Payer: Self-pay | Admitting: Advanced Practice Midwife

## 2023-02-15 ENCOUNTER — Other Ambulatory Visit: Payer: Self-pay

## 2023-02-15 ENCOUNTER — Ambulatory Visit (INDEPENDENT_AMBULATORY_CARE_PROVIDER_SITE_OTHER): Payer: Medicaid Other | Admitting: Family Medicine

## 2023-02-15 VITALS — BP 126/84 | HR 105 | Wt 241.9 lb

## 2023-02-15 DIAGNOSIS — Z6791 Unspecified blood type, Rh negative: Secondary | ICD-10-CM

## 2023-02-15 DIAGNOSIS — Z86711 Personal history of pulmonary embolism: Secondary | ICD-10-CM

## 2023-02-15 DIAGNOSIS — Z8759 Personal history of other complications of pregnancy, childbirth and the puerperium: Secondary | ICD-10-CM

## 2023-02-15 DIAGNOSIS — O0990 Supervision of high risk pregnancy, unspecified, unspecified trimester: Secondary | ICD-10-CM

## 2023-02-15 DIAGNOSIS — Z3A34 34 weeks gestation of pregnancy: Secondary | ICD-10-CM

## 2023-02-15 DIAGNOSIS — O0993 Supervision of high risk pregnancy, unspecified, third trimester: Secondary | ICD-10-CM

## 2023-02-15 DIAGNOSIS — O26893 Other specified pregnancy related conditions, third trimester: Secondary | ICD-10-CM

## 2023-02-15 NOTE — Progress Notes (Signed)
Called L&D to schedule Induction, was advised to far in advance to call back either end of next week or the week of 03/25 th.

## 2023-02-15 NOTE — Progress Notes (Signed)
   PRENATAL VISIT NOTE  Subjective:  Alexis Richardson is a 22 y.o. G2P1001 at [redacted]w[redacted]d being seen today for ongoing prenatal care.  She is currently monitored for the following issues for this high-risk pregnancy and has History of pulmonary embolus (PE); Rh negative state in antepartum period; Supervision of high risk pregnancy, antepartum; History of gestational hypertension; History of bilateral breast reduction surgery; Atypical squamous cell changes of undetermined significance (ASCUS) on cervical cytology with negative high risk human papilloma virus (HPV) test result; and Anticoagulated on their problem list.  Patient reports no bleeding, no contractions, no cramping, and no leaking.  Contractions: Not present. Vag. Bleeding: None.  Movement: Present. Denies leaking of fluid.   The following portions of the patient's history were reviewed and updated as appropriate: allergies, current medications, past family history, past medical history, past social history, past surgical history and problem list.   Objective:   Vitals:   02/15/23 1322  BP: 126/84  Pulse: (!) 105  Weight: 241 lb 14.4 oz (109.7 kg)    Fetal Status: Fetal Heart Rate (bpm): 149   Movement: Present     General:  Alert, oriented and cooperative. Patient is in no acute distress.  Skin: Skin is warm and dry. No rash noted.   Cardiovascular: Normal heart rate noted  Respiratory: Normal respiratory effort, no problems with respiration noted  Abdomen: Soft, gravid, appropriate for gestational age.  Pain/Pressure: Present     Pelvic: Cervical exam deferred        Extremities: Normal range of motion.  Edema: Trace  Mental Status: Normal mood and affect. Normal behavior. Normal judgment and thought content.   Assessment and Plan:  Pregnancy: G2P1001 at [redacted]w[redacted]d 1. Supervision of high risk pregnancy, antepartum Continue routine prenatal care GBS and GC/chlamydia next visit  2. Rh negative state in antepartum period RhoGAM eval  postdelivery  3. History of pulmonary embolus (PE) Currently on Lovenox.  Is planning on epidural so would need to not have Lovenox 18 hours prior to induction  4. History of gestational hypertension Currently on baby ASA Blood pressures within normal limits No symptoms of preeclampsia  5. [redacted] weeks gestation of pregnancy Planning on induction at 39 weeks due to history of PE on Lovenox  Preterm labor symptoms and general obstetric precautions including but not limited to vaginal bleeding, contractions, leaking of fluid and fetal movement were reviewed in detail with the patient. Please refer to After Visit Summary for other counseling recommendations.   No follow-ups on file.  Future Appointments  Date Time Provider [place]  02/22/2023  9:30 AM Alvera Singh, PT [facility] None  03/09/2023 12:30 PM [facility] NURSE [facility]  03/09/2023 12:45 PM [facility] US4 [facility]US [place]    Concepcion Living, MD

## 2023-02-16 ENCOUNTER — Telehealth: Payer: Self-pay | Admitting: Family Medicine

## 2023-02-16 NOTE — Telephone Encounter (Signed)
Patient is needing a Rx for her Breast Pump state it has been 1 month.

## 2023-02-17 NOTE — Telephone Encounter (Signed)
Pt notified that her breast pump prescription was faxed today.  Pt verbalized understanding with no other questions.   Alexis Richardson  02/17/23

## 2023-02-18 ENCOUNTER — Encounter: Payer: Medicaid Other | Admitting: Medical

## 2023-02-22 ENCOUNTER — Ambulatory Visit: Payer: Medicaid Other | Admitting: Physical Therapy

## 2023-03-03 ENCOUNTER — Encounter: Payer: Medicaid Other | Admitting: Obstetrics and Gynecology

## 2023-03-09 ENCOUNTER — Other Ambulatory Visit (HOSPITAL_COMMUNITY)
Admission: RE | Admit: 2023-03-09 | Discharge: 2023-03-09 | Disposition: A | Discharge status: Home / Self Care | Payer: Medicaid Other | Source: Ambulatory Visit | Attending: Obstetrics & Gynecology | Admitting: Obstetrics & Gynecology

## 2023-03-09 ENCOUNTER — Other Ambulatory Visit: Payer: Self-pay | Admitting: Obstetrics

## 2023-03-09 ENCOUNTER — Ambulatory Visit (INDEPENDENT_AMBULATORY_CARE_PROVIDER_SITE_OTHER): Payer: Medicaid Other | Admitting: Obstetrics & Gynecology

## 2023-03-09 ENCOUNTER — Ambulatory Visit: Payer: Medicaid Other | Admitting: *Deleted

## 2023-03-09 ENCOUNTER — Ambulatory Visit: Payer: Medicaid Other | Attending: Obstetrics

## 2023-03-09 ENCOUNTER — Other Ambulatory Visit: Payer: Self-pay

## 2023-03-09 VITALS — BP 124/73 | HR 104

## 2023-03-09 VITALS — BP 121/85 | HR 89 | Wt 244.9 lb

## 2023-03-09 DIAGNOSIS — O0990 Supervision of high risk pregnancy, unspecified, unspecified trimester: Secondary | ICD-10-CM

## 2023-03-09 DIAGNOSIS — O99213 Obesity complicating pregnancy, third trimester: Secondary | ICD-10-CM | POA: Diagnosis not present

## 2023-03-09 DIAGNOSIS — Z86711 Personal history of pulmonary embolism: Secondary | ICD-10-CM

## 2023-03-09 DIAGNOSIS — E669 Obesity, unspecified: Secondary | ICD-10-CM

## 2023-03-09 DIAGNOSIS — Z8759 Personal history of other complications of pregnancy, childbirth and the puerperium: Secondary | ICD-10-CM

## 2023-03-09 DIAGNOSIS — Z3A37 37 weeks gestation of pregnancy: Secondary | ICD-10-CM | POA: Diagnosis not present

## 2023-03-09 DIAGNOSIS — Z8632 Personal history of gestational diabetes: Secondary | ICD-10-CM

## 2023-03-09 DIAGNOSIS — O09293 Supervision of pregnancy with other poor reproductive or obstetric history, third trimester: Secondary | ICD-10-CM | POA: Diagnosis not present

## 2023-03-09 DIAGNOSIS — O26899 Other specified pregnancy related conditions, unspecified trimester: Secondary | ICD-10-CM | POA: Diagnosis present

## 2023-03-09 DIAGNOSIS — Z7901 Long term (current) use of anticoagulants: Secondary | ICD-10-CM

## 2023-03-09 DIAGNOSIS — O0993 Supervision of high risk pregnancy, unspecified, third trimester: Secondary | ICD-10-CM | POA: Diagnosis not present

## 2023-03-09 DIAGNOSIS — Z6791 Unspecified blood type, Rh negative: Secondary | ICD-10-CM | POA: Insufficient documentation

## 2023-03-09 NOTE — Progress Notes (Signed)
   PRENATAL VISIT NOTE  Subjective:  Alexis Richardson is a 22 y.o. G2P1001 at [redacted]w[redacted]d being seen today for ongoing prenatal care.  She is currently monitored for the following issues for this high-risk pregnancy and has History of pulmonary embolus (PE); Rh negative state in antepartum period; Supervision of high risk pregnancy, antepartum; History of gestational hypertension; History of bilateral breast reduction surgery; Atypical squamous cell changes of undetermined significance (ASCUS) on cervical cytology with negative high risk human papilloma virus (HPV) test result; and Anticoagulated on their problem list.  Patient reports occasional contractions.  Contractions: Irritability. Vag. Bleeding: None.  Movement: Present. Denies leaking of fluid.   The following portions of the patient's history were reviewed and updated as appropriate: allergies, current medications, past family history, past medical history, past social history, past surgical history and problem list.   Objective:   Vitals:   03/09/23 1438  BP: 121/85  Pulse: 89  Weight: 244 lb 14.4 oz (111.1 kg)    Fetal Status: Fetal Heart Rate (bpm): 146   Movement: Present  Presentation: Vertex  General:  Alert, oriented and cooperative. Patient is in no acute distress.  Skin: Skin is warm and dry. No rash noted.   Cardiovascular: Normal heart rate noted  Respiratory: Normal respiratory effort, no problems with respiration noted  Abdomen: Soft, gravid, appropriate for gestational age.  Pain/Pressure: Present     Pelvic: Cervical exam performed in the presence of a chaperone Dilation: 3 Effacement (%): 50 Station: -3, cultures done.  Extremities: Normal range of motion.  Edema: None  Mental Status: Normal mood and affect. Normal behavior. Normal judgment and thought content.   Assessment and Plan:  Pregnancy: G2P1001 at [redacted]w[redacted]d 1. History of gestational hypertension Stable BP, continue ASA.  2. History of pulmonary embolus (PE) 3.  Anticoagulated Continue Lovenox.  IOL scheduled for 03/19/23 at midnight, patient told to have her last Lovenox dose be on 03/16/22 night.  Orders signed and held.   4. [redacted] weeks gestation of pregnancy 5. Supervision of high risk pregnancy, antepartum Pelvic cultures done. - GC/Chlamydia probe amp ([place])not at [facility] - Culture, beta strep (group b only) Preterm labor symptoms and general obstetric precautions including but not limited to vaginal bleeding, contractions, leaking of fluid and fetal movement were reviewed in detail with the patient. Please refer to After Visit Summary for other counseling recommendations.   Return in about 1 week (around 03/16/2023) for NST, BPP, OFFICE OB VISIT (MD only).  Future Appointments  Date Time Provider [place]  03/19/2023 12:00 AM [facility] SCHED ROOM [facility] None    Verita Schneiders, MD

## 2023-03-09 NOTE — Patient Instructions (Signed)
Last dose of Lovenox would be 03/17/23 night  IOL 03/19/23 at midnight, come to hospital 11:45 pm on 03/18/23

## 2023-03-10 ENCOUNTER — Encounter (HOSPITAL_COMMUNITY): Payer: Self-pay | Admitting: *Deleted

## 2023-03-10 ENCOUNTER — Telehealth (HOSPITAL_COMMUNITY): Payer: Self-pay | Admitting: *Deleted

## 2023-03-10 DIAGNOSIS — Z0289 Encounter for other administrative examinations: Secondary | ICD-10-CM

## 2023-03-10 LAB — GC/CHLAMYDIA PROBE AMP (~~LOC~~) NOT AT ARMC
Chlamydia: NEGATIVE
Comment: NEGATIVE
Comment: NORMAL
Neisseria Gonorrhea: NEGATIVE

## 2023-03-10 NOTE — Telephone Encounter (Signed)
Preadmission screen  

## 2023-03-11 ENCOUNTER — Other Ambulatory Visit: Payer: Self-pay | Admitting: Advanced Practice Midwife

## 2023-03-11 ENCOUNTER — Telehealth: Payer: Self-pay

## 2023-03-11 NOTE — Telephone Encounter (Signed)
03/09/23 -- routine prenatal visit, 3 cm dilated  Today -- continued vaginal discharge with spotting, lower back in constant pain 3/10, recommended warm bath and Tylenol for pain relief.  Reviewed labor precautions for when to go to MAU for evaluation.

## 2023-03-13 LAB — CULTURE, BETA STREP (GROUP B ONLY): Strep Gp B Culture: NEGATIVE

## 2023-03-14 ENCOUNTER — Inpatient Hospital Stay (HOSPITAL_COMMUNITY)
Admission: AD | Admit: 2023-03-14 | Discharge: 2023-03-14 | Disposition: A | Discharge status: Home / Self Care | Payer: Medicaid Other | Attending: Obstetrics & Gynecology | Admitting: Obstetrics & Gynecology

## 2023-03-14 ENCOUNTER — Inpatient Hospital Stay (HOSPITAL_BASED_OUTPATIENT_CLINIC_OR_DEPARTMENT_OTHER): Payer: Medicaid Other

## 2023-03-14 ENCOUNTER — Encounter (HOSPITAL_COMMUNITY): Payer: Self-pay | Admitting: Obstetrics & Gynecology

## 2023-03-14 DIAGNOSIS — Z3689 Encounter for other specified antenatal screening: Secondary | ICD-10-CM | POA: Diagnosis not present

## 2023-03-14 DIAGNOSIS — Z3A38 38 weeks gestation of pregnancy: Secondary | ICD-10-CM

## 2023-03-14 DIAGNOSIS — O479 False labor, unspecified: Secondary | ICD-10-CM | POA: Diagnosis not present

## 2023-03-14 DIAGNOSIS — O471 False labor at or after 37 completed weeks of gestation: Secondary | ICD-10-CM | POA: Insufficient documentation

## 2023-03-14 DIAGNOSIS — O368130 Decreased fetal movements, third trimester, not applicable or unspecified: Secondary | ICD-10-CM | POA: Diagnosis not present

## 2023-03-14 DIAGNOSIS — O26893 Other specified pregnancy related conditions, third trimester: Secondary | ICD-10-CM | POA: Diagnosis not present

## 2023-03-14 NOTE — MAU Provider Note (Signed)
History     [id number]  Arrival date and time: 03/14/23 0050    Chief Complaint  Patient presents with   Contractions   Hypertension     HPI Alexis Richardson is a 22 y.o. at [redacted]w[redacted]d by LMP who presents for contractions & decreased fetal movement. Reports feeling contractions every 5 minutes for the last 5 hours. Denies vaginal bleeding or LOF. Hasn't felt baby move all day. Has felt movement once since arrival to MAU.  Has a slight headache at home earlier so took her BP & it was elevated. Headache resolved without intervention. Denies visual disturbance or epigastric pain. History of gestational hypertension in last pregnancy.     A/Negative/-- (10/03 1125)  OB History     Gravida  2   Para  1   Term  1   Preterm  0   AB  0   Living  1      SAB  0   IAB  0   Ectopic  0   Multiple  0   Live Births  1           Past Medical History:  Diagnosis Date   Gestational diabetes 03/26/2019   Neg postpartum GTT   Gestational hypertension 05/03/2019   Pulmonary embolism    a. Dx 09/2017, had IUD removed in case this contributed.   Right ventricular dilation    a. at time of PE 09/2017>11/2027 echo wnl    Past Surgical History:  Procedure Laterality Date   BREAST REDUCTION SURGERY Bilateral 01/05/2021   Procedure: BILATERAL MAMMARY REDUCTION;  Surgeon: [name], MD;  Location: [facility];  Service: Plastics;  Laterality: Bilateral;   KELOID EXCISION      Family History  Problem Relation Age of Onset   Hypertension Mother    Hypertension Maternal Aunt    Diabetes Maternal Grandmother    Cancer Maternal Grandfather    Pulmonary embolism Neg Hx    Heart disease Neg Hx    Asthma Neg Hx    Stroke Neg Hx     Social History   Socioeconomic History   Marital status: Single    Spouse name: Not on file   Number of children: Not on file   Years of education: Not on file   Highest education level: Not on file  Occupational History    Not on file  Tobacco Use   Smoking status: Never   Smokeless tobacco: Never  Vaping Use   Vaping Use: Never used  Substance and Sexual Activity   Alcohol use: Not Currently    Comment: occasional   Drug use: Not Currently   Sexual activity: Yes    Birth control/protection: None  Other Topics Concern   Not on file  Social History Narrative   Not on file   Social Determinants of Health   Financial Resource Strain: Not on file  Food Insecurity: No Food Insecurity (01/09/2019)   Hunger Vital Sign    Worried About Running Out of Food in the Last Year: Never true    Ran Out of Food in the Last Year: Never true  Transportation Needs: No Transportation Needs (01/09/2019)   PRAPARE - Administrator, Civil Service (Medical): No    Lack of Transportation (Non-Medical): No  Physical Activity: Not on file  Stress: Not on file  Social Connections: Not on file  Intimate Partner Violence: Not on file    No Known Allergies  No current facility-administered  medications on file prior to encounter.   Current Outpatient Medications on File Prior to Encounter  Medication Sig Dispense Refill   aspirin EC 81 MG tablet Take 1 tablet (81 mg total) by mouth daily. Take after 12 weeks for prevention of preeclampssia later in pregnancy 300 tablet 2   enoxaparin (LOVENOX) 40 MG/0.4ML injection Inject 0.4 mLs (40 mg total) into the skin daily. 12 mL 3   Prenat-FeFum-FePo-FA-Omega 3 (CONCEPT DHA) 53.5-38-1 MG CAPS Take 1 tablet by mouth daily. 30 capsule 12   Acetaminophen (TYLENOL 8 HOUR PO) Take by mouth.     Blood Pressure Monitoring (BLOOD PRESSURE KIT) DEVI 1 Device by Does not apply route as needed. 1 each 0   cyclobenzaprine (FLEXERIL) 10 MG tablet Take 1 tablet (10 mg total) by mouth 3 (three) times daily as needed for muscle spasms. (Patient not taking: Reported on 02/15/2023) 30 tablet 2   Misc. Devices (GOJJI WEIGHT SCALE) MISC 1 Device by Does not apply route as needed. 1 each 0      ROS Pertinent positives and negative per HPI, all others reviewed and negative  Physical Exam   BP 135/88 (BP Location: Right Arm)   Pulse 93   Temp 98.3 F (36.8 C) (Oral)   Resp 17   Ht 5\' 3"  (1.6 m)   Wt 111.9 kg   LMP 06/19/2022   SpO2 100%   BMI 43.70 kg/m   Patient Vitals for the past 24 hrs:  BP Temp Temp src Pulse Resp SpO2 Height Weight  03/14/23 0325 135/88 -- -- 93 17 -- -- --  03/14/23 0250 -- -- -- -- -- 100 % -- --  03/14/23 0245 -- -- -- -- -- 99 % -- --  03/14/23 0240 -- -- -- -- -- 100 % -- --  03/14/23 0235 -- -- -- -- -- 100 % -- --  03/14/23 0230 -- -- -- -- -- 99 % -- --  03/14/23 0225 -- -- -- -- -- 99 % -- --  03/14/23 0220 -- -- -- -- -- 99 % -- --  03/14/23 0215 -- -- -- -- -- 99 % -- --  03/14/23 0210 -- -- -- -- -- 98 % -- --  03/14/23 0205 -- -- -- -- -- 99 % -- --  03/14/23 0200 -- -- -- -- -- 98 % -- --  03/14/23 0155 -- -- -- -- -- 98 % -- --  03/14/23 0150 -- -- -- -- -- 98 % -- --  03/14/23 0145 130/69 -- -- 86 -- 97 % -- --  03/14/23 0140 -- -- -- -- -- 97 % -- --  03/14/23 0138 132/88 -- -- 90 -- -- -- --  03/14/23 0135 -- -- -- -- -- 97 % -- --  03/14/23 0130 -- -- -- -- -- 97 % -- --  03/14/23 0110 132/88 98.3 F (36.8 C) Oral 98 17 100 % 5\' 3"  (1.6 m) 111.9 kg    Physical Exam Vitals and nursing note reviewed.  Constitutional:      General: She is not in acute distress.    Appearance: Normal appearance. She is not ill-appearing.  HENT:     Head: Normocephalic and atraumatic.  Eyes:     General: No scleral icterus.    Pupils: Pupils are equal, round, and reactive to light.  Pulmonary:     Effort: Pulmonary effort is normal. No respiratory distress.  Abdominal:     Tenderness: There is no abdominal tenderness.  Comments: gravid  Skin:    General: Skin is warm and dry.  Neurological:     Mental Status: She is alert.      Cervical Exam Dilation: 3 Effacement (%): 60 Cervical Position: Posterior Station:  -3 Presentation: Vertex Exam by:: Felipa FurnacePerla Alvarez RN   FHT Baseline 140, moderate variability, 15x15 accels, no decels Toco: Q 2-8 mins Cat: 1  Labs No results found for this or any previous visit (from the past 24 hour(s)).  Imaging No results found.  MAU Course  Procedures Lab Orders  No laboratory test(s) ordered today   No orders of the defined types were placed in this encounter.  Imaging Orders         US MFM FETAL BPP WO NON STRESS      MDM Low  Assessment and Plan   1. False labor   2. Decreased fetal movements in third trimester, single or unspecified fetus   3. NST (non-stress test) reactive   4. [redacted] weeks gestation of pregnancy    -Patient's cervix unchanged from office exam & unchanged while in MAU. Reviewed labor precautions.  -Reports decreased fetal movement. Fetal tracing reactive; BPP ordered due to persistent DFM. BPP 8/8 with normal AFI -Normotensive in MAU. Reviewed preeclampsia precautions  #FWB: Reactive NST & BPP 8/8    Dispo: discharged to home in stable condition.   Discharge Instructions     Discharge patient   Complete by: As directed    Discharge disposition: 01-Home or Self Care   Discharge patient date: 03/14/2023       Judeth HornErin Ailine Hefferan, NP 03/14/23 6:25 AM  Allergies as of 03/14/2023   No Known Allergies      Medication List     TAKE these medications    aspirin EC 81 MG tablet Take 1 tablet (81 mg total) by mouth daily. Take after 12 weeks for prevention of preeclampssia later in pregnancy   Blood Pressure Kit Devi 1 Device by Does not apply route as needed.   Concept DHA 53.5-38-1 MG Caps Take 1 tablet by mouth daily.   cyclobenzaprine 10 MG tablet Commonly known as: FLEXERIL Take 1 tablet (10 mg total) by mouth 3 (three) times daily as needed for muscle spasms.   enoxaparin 40 MG/0.4ML injection Commonly known as: LOVENOX Inject 0.4 mLs (40 mg total) into the skin daily.   Gojji Weight Scale Misc 1  Device by Does not apply route as needed.   TYLENOL 8 HOUR PO Take by mouth.

## 2023-03-14 NOTE — MAU Note (Signed)
.  Alexis Richardson is a 22 y.o. at [redacted]w[redacted]d here in MAU reporting: intermittent headache with none currently. Denies visual changes or epigastric pain. "Something just told me to check my blood pressure. I had elevated BP at the end of my first pregnancy."  Contractions every: 5 minutes Onset of ctx: Yesterday  5 hours ago Pain score: Mild  ROM: Intact Vaginal Bleeding: None Last SVE: 3cm  Epidural: Planning  Fetal Movement: Reports decreased FM FHT:145 via Patient taken straight to room  Vitals:   03/14/23 0110  BP: 132/88  Pulse: 98  Resp: 17  Temp: 98.3 F (36.8 C)  SpO2: 100%       OB Office: Faculty GBS: Negative HSV: Denies hx of HSV Lab orders placed from triage: MAU Labor Eval

## 2023-03-16 ENCOUNTER — Other Ambulatory Visit (INDEPENDENT_AMBULATORY_CARE_PROVIDER_SITE_OTHER): Payer: Medicaid Other

## 2023-03-16 ENCOUNTER — Ambulatory Visit (INDEPENDENT_AMBULATORY_CARE_PROVIDER_SITE_OTHER): Payer: Medicaid Other | Admitting: General Practice

## 2023-03-16 ENCOUNTER — Other Ambulatory Visit: Payer: Self-pay

## 2023-03-16 ENCOUNTER — Other Ambulatory Visit: Payer: Self-pay | Admitting: Advanced Practice Midwife

## 2023-03-16 VITALS — BP 129/85 | HR 86

## 2023-03-16 DIAGNOSIS — Z3A38 38 weeks gestation of pregnancy: Secondary | ICD-10-CM

## 2023-03-16 DIAGNOSIS — Z86711 Personal history of pulmonary embolism: Secondary | ICD-10-CM

## 2023-03-16 DIAGNOSIS — O0990 Supervision of high risk pregnancy, unspecified, unspecified trimester: Secondary | ICD-10-CM

## 2023-03-16 NOTE — Progress Notes (Signed)
Pt informed that the ultrasound is considered a limited OB ultrasound and is not intended to be a complete ultrasound exam.  Patient also informed that the ultrasound is not being completed with the intent of assessing for fetal or placental anomalies or any pelvic abnormalities.  Explained that the purpose of today's ultrasound is to assess for  BPP, presentation, and AFI.  Patient acknowledges the purpose of the exam and the limitations of the study.     Jordyan Hardiman H RN BSN 03/16/23  

## 2023-03-17 ENCOUNTER — Encounter: Payer: Self-pay | Admitting: General Practice

## 2023-03-19 ENCOUNTER — Inpatient Hospital Stay (HOSPITAL_COMMUNITY): Payer: Medicaid Other

## 2023-03-19 ENCOUNTER — Encounter (HOSPITAL_COMMUNITY): Payer: Self-pay | Admitting: Obstetrics & Gynecology

## 2023-03-19 ENCOUNTER — Inpatient Hospital Stay (HOSPITAL_COMMUNITY): Payer: Medicaid Other | Admitting: Anesthesiology

## 2023-03-19 ENCOUNTER — Inpatient Hospital Stay (HOSPITAL_COMMUNITY)
Admission: RE | Admit: 2023-03-19 | Discharge: 2023-03-21 | DRG: 806 | Disposition: A | Discharge status: Home / Self Care | Payer: Medicaid Other | Attending: Obstetrics & Gynecology | Admitting: Obstetrics & Gynecology

## 2023-03-19 DIAGNOSIS — O9902 Anemia complicating childbirth: Secondary | ICD-10-CM | POA: Diagnosis present

## 2023-03-19 DIAGNOSIS — O164 Unspecified maternal hypertension, complicating childbirth: Secondary | ICD-10-CM | POA: Diagnosis not present

## 2023-03-19 DIAGNOSIS — Z7901 Long term (current) use of anticoagulants: Secondary | ICD-10-CM | POA: Diagnosis not present

## 2023-03-19 DIAGNOSIS — Z7982 Long term (current) use of aspirin: Secondary | ICD-10-CM

## 2023-03-19 DIAGNOSIS — Z3A39 39 weeks gestation of pregnancy: Secondary | ICD-10-CM

## 2023-03-19 DIAGNOSIS — Z6791 Unspecified blood type, Rh negative: Secondary | ICD-10-CM | POA: Diagnosis not present

## 2023-03-19 DIAGNOSIS — Z86718 Personal history of other venous thrombosis and embolism: Secondary | ICD-10-CM | POA: Diagnosis not present

## 2023-03-19 DIAGNOSIS — Z8632 Personal history of gestational diabetes: Secondary | ICD-10-CM

## 2023-03-19 DIAGNOSIS — O99214 Obesity complicating childbirth: Secondary | ICD-10-CM | POA: Diagnosis present

## 2023-03-19 DIAGNOSIS — Z86711 Personal history of pulmonary embolism: Secondary | ICD-10-CM | POA: Diagnosis not present

## 2023-03-19 DIAGNOSIS — D62 Acute posthemorrhagic anemia: Secondary | ICD-10-CM | POA: Diagnosis not present

## 2023-03-19 DIAGNOSIS — O26899 Other specified pregnancy related conditions, unspecified trimester: Secondary | ICD-10-CM

## 2023-03-19 DIAGNOSIS — O26893 Other specified pregnancy related conditions, third trimester: Secondary | ICD-10-CM | POA: Diagnosis present

## 2023-03-19 DIAGNOSIS — O0990 Supervision of high risk pregnancy, unspecified, unspecified trimester: Secondary | ICD-10-CM

## 2023-03-19 DIAGNOSIS — Z8759 Personal history of other complications of pregnancy, childbirth and the puerperium: Secondary | ICD-10-CM

## 2023-03-19 DIAGNOSIS — O165 Unspecified maternal hypertension, complicating the puerperium: Secondary | ICD-10-CM | POA: Insufficient documentation

## 2023-03-19 LAB — CBC WITH DIFFERENTIAL/PLATELET
Abs Immature Granulocytes: 0.04 10*3/uL (ref 0.00–0.07)
Basophils Absolute: 0 10*3/uL (ref 0.0–0.1)
Basophils Relative: 0 %
Eosinophils Absolute: 0 10*3/uL (ref 0.0–0.5)
Eosinophils Relative: 0 %
HCT: 35 % — ABNORMAL LOW (ref 36.0–46.0)
Hemoglobin: 11.5 g/dL — ABNORMAL LOW (ref 12.0–15.0)
Immature Granulocytes: 0 %
Lymphocytes Relative: 11 %
Lymphs Abs: 1.3 10*3/uL (ref 0.7–4.0)
MCH: 27.3 pg (ref 26.0–34.0)
MCHC: 32.9 g/dL (ref 30.0–36.0)
MCV: 82.9 fL (ref 80.0–100.0)
Monocytes Absolute: 0.6 10*3/uL (ref 0.1–1.0)
Monocytes Relative: 5 %
Neutro Abs: 10.7 10*3/uL — ABNORMAL HIGH (ref 1.7–7.7)
Neutrophils Relative %: 84 %
Platelets: 162 10*3/uL (ref 150–400)
RBC: 4.22 MIL/uL (ref 3.87–5.11)
RDW: 14.9 % (ref 11.5–15.5)
WBC: 12.7 10*3/uL — ABNORMAL HIGH (ref 4.0–10.5)
nRBC: 0 % (ref 0.0–0.2)

## 2023-03-19 LAB — TYPE AND SCREEN
ABO/RH(D): A NEG
Antibody Screen: NEGATIVE

## 2023-03-19 LAB — CBC
HCT: 33.9 % — ABNORMAL LOW (ref 36.0–46.0)
Hemoglobin: 11.1 g/dL — ABNORMAL LOW (ref 12.0–15.0)
MCH: 27.4 pg (ref 26.0–34.0)
MCHC: 32.7 g/dL (ref 30.0–36.0)
MCV: 83.7 fL (ref 80.0–100.0)
Platelets: 171 10*3/uL (ref 150–400)
RBC: 4.05 MIL/uL (ref 3.87–5.11)
RDW: 14.8 % (ref 11.5–15.5)
WBC: 10.2 10*3/uL (ref 4.0–10.5)
nRBC: 0 % (ref 0.0–0.2)

## 2023-03-19 LAB — PROTIME-INR
INR: 1 (ref 0.8–1.2)
Prothrombin Time: 13.1 seconds (ref 11.4–15.2)

## 2023-03-19 MED ORDER — DIBUCAINE (PERIANAL) 1 % EX OINT: 1.0000 | TOPICAL_OINTMENT | CUTANEOUS | Status: DC | PRN

## 2023-03-19 MED ORDER — OXYTOCIN BOLUS FROM INFUSION
333.0000 mL | Freq: Once | INTRAVENOUS | Status: DC
  Administered 2023-03-19: 333 mL via INTRAVENOUS

## 2023-03-19 MED ORDER — FENTANYL CITRATE (PF) 100 MCG/2ML IJ SOLN: 50.0000 ug | INTRAMUSCULAR | Status: DC | PRN

## 2023-03-19 MED ORDER — ZOLPIDEM TARTRATE 5 MG PO TABS: 5.0000 mg | ORAL_TABLET | Freq: Every evening | ORAL | Status: DC | PRN

## 2023-03-19 MED ORDER — BUPIVACAINE HCL (PF) 0.25 % IJ SOLN
INTRAMUSCULAR | Status: DC | PRN
  Administered 2023-03-19: 8 mL via EPIDURAL

## 2023-03-19 MED ORDER — DIPHENHYDRAMINE HCL 25 MG PO CAPS: 25.0000 mg | ORAL_CAPSULE | Freq: Four times a day (QID) | ORAL | Status: DC | PRN

## 2023-03-19 MED ORDER — OXYTOCIN-SODIUM CHLORIDE 30-0.9 UT/500ML-% IV SOLN
1.0000 m[IU]/min | INTRAVENOUS | Status: DC
  Administered 2023-03-19: 2 m[IU]/min via INTRAVENOUS
  Filled 2023-03-19: qty 500

## 2023-03-19 MED ORDER — SODIUM CHLORIDE 0.9% FLUSH: 3.0000 mL | INTRAVENOUS | Status: DC | PRN

## 2023-03-19 MED ORDER — PRENATAL MULTIVITAMIN CH
1.0000 | ORAL_TABLET | Freq: Every day | ORAL | Status: DC
  Administered 2023-03-20: 1 via ORAL
  Filled 2023-03-19: qty 1

## 2023-03-19 MED ORDER — ONDANSETRON HCL 4 MG/2ML IJ SOLN: 4.0000 mg | INTRAMUSCULAR | Status: DC | PRN

## 2023-03-19 MED ORDER — EPHEDRINE 5 MG/ML INJ: 10.0000 mg | INTRAVENOUS | Status: DC | PRN

## 2023-03-19 MED ORDER — ACETAMINOPHEN 325 MG PO TABS: 650.0000 mg | ORAL_TABLET | ORAL | Status: DC | PRN

## 2023-03-19 MED ORDER — LABETALOL HCL 5 MG/ML IV SOLN: 40.0000 mg | INTRAVENOUS | Status: DC | PRN

## 2023-03-19 MED ORDER — WITCH HAZEL-GLYCERIN EX PADS: 1.0000 | MEDICATED_PAD | CUTANEOUS | Status: DC | PRN

## 2023-03-19 MED ORDER — FENTANYL INJECTION ORDERABLE
Status: DC | PRN
  Administered 2023-03-19: 100 ug via EPIDURAL

## 2023-03-19 MED ORDER — HYDRALAZINE HCL 20 MG/ML IJ SOLN: 10.0000 mg | INTRAMUSCULAR | Status: DC | PRN

## 2023-03-19 MED ORDER — LACTATED RINGERS IV SOLN
500.0000 mL | Freq: Once | INTRAVENOUS | Status: AC
  Administered 2023-03-19: 500 mL via INTRAVENOUS

## 2023-03-19 MED ORDER — FENTANYL-BUPIVACAINE-NACL 0.5-0.125-0.9 MG/250ML-% EP SOLN
12.0000 mL/h | EPIDURAL | Status: DC | PRN
  Administered 2023-03-19: 12 mL/h via EPIDURAL
  Filled 2023-03-19: qty 250

## 2023-03-19 MED ORDER — OXYCODONE-ACETAMINOPHEN 5-325 MG PO TABS: 2.0000 | ORAL_TABLET | ORAL | Status: DC | PRN

## 2023-03-19 MED ORDER — BENZOCAINE-MENTHOL 20-0.5 % EX AERO: 1.0000 | INHALATION_SPRAY | CUTANEOUS | Status: DC | PRN

## 2023-03-19 MED ORDER — FENTANYL CITRATE (PF) 100 MCG/2ML IJ SOLN
INTRAMUSCULAR | Status: AC
  Filled 2023-03-19: qty 2

## 2023-03-19 MED ORDER — LACTATED RINGERS IV SOLN
500.0000 mL | INTRAVENOUS | Status: DC | PRN
  Administered 2023-03-19: 500 mL via INTRAVENOUS

## 2023-03-19 MED ORDER — MISOPROSTOL 25 MCG QUARTER TABLET: 25.0000 ug | ORAL_TABLET | Freq: Once | ORAL | Status: DC

## 2023-03-19 MED ORDER — SENNOSIDES-DOCUSATE SODIUM 8.6-50 MG PO TABS
2.0000 | ORAL_TABLET | ORAL | Status: DC
  Administered 2023-03-19 – 2023-03-20 (×2): 2 via ORAL
  Filled 2023-03-19 (×2): qty 2

## 2023-03-19 MED ORDER — ONDANSETRON HCL 4 MG PO TABS: 4.0000 mg | ORAL_TABLET | ORAL | Status: DC | PRN

## 2023-03-19 MED ORDER — PHENYLEPHRINE 80 MCG/ML (10ML) SYRINGE FOR IV PUSH (FOR BLOOD PRESSURE SUPPORT): 80.0000 ug | PREFILLED_SYRINGE | INTRAVENOUS | Status: DC | PRN

## 2023-03-19 MED ORDER — SIMETHICONE 80 MG PO CHEW: 80.0000 mg | CHEWABLE_TABLET | ORAL | Status: DC | PRN

## 2023-03-19 MED ORDER — OXYTOCIN-SODIUM CHLORIDE 30-0.9 UT/500ML-% IV SOLN
2.5000 [IU]/h | INTRAVENOUS | Status: DC
  Administered 2023-03-19: 2.5 [IU]/h via INTRAVENOUS

## 2023-03-19 MED ORDER — SODIUM CHLORIDE 0.9 % IV SOLN: 250.0000 mL | INTRAVENOUS | Status: DC | PRN

## 2023-03-19 MED ORDER — LABETALOL HCL 5 MG/ML IV SOLN: 20.0000 mg | INTRAVENOUS | Status: DC | PRN

## 2023-03-19 MED ORDER — LIDOCAINE HCL (PF) 1 % IJ SOLN
INTRAMUSCULAR | Status: DC | PRN
  Administered 2023-03-19: 3 mL via EPIDURAL
  Administered 2023-03-19: 5 mL via EPIDURAL

## 2023-03-19 MED ORDER — ONDANSETRON HCL 4 MG/2ML IJ SOLN: 4.0000 mg | Freq: Four times a day (QID) | INTRAMUSCULAR | Status: DC | PRN

## 2023-03-19 MED ORDER — DIPHENHYDRAMINE HCL 50 MG/ML IJ SOLN: 12.5000 mg | INTRAMUSCULAR | Status: DC | PRN

## 2023-03-19 MED ORDER — TERBUTALINE SULFATE 1 MG/ML IJ SOLN: 0.2500 mg | Freq: Once | INTRAMUSCULAR | Status: DC | PRN

## 2023-03-19 MED ORDER — HYDROXYZINE HCL 50 MG PO TABS: 50.0000 mg | ORAL_TABLET | Freq: Four times a day (QID) | ORAL | Status: DC | PRN

## 2023-03-19 MED ORDER — LACTATED RINGERS IV SOLN: INTRAVENOUS | Status: DC

## 2023-03-19 MED ORDER — SODIUM CHLORIDE 0.9% FLUSH
3.0000 mL | Freq: Two times a day (BID) | INTRAVENOUS | Status: DC
  Administered 2023-03-20: 3 mL via INTRAVENOUS

## 2023-03-19 MED ORDER — SOD CITRATE-CITRIC ACID 500-334 MG/5ML PO SOLN: 30.0000 mL | ORAL | Status: DC | PRN

## 2023-03-19 MED ORDER — LABETALOL HCL 5 MG/ML IV SOLN: 80.0000 mg | INTRAVENOUS | Status: DC | PRN

## 2023-03-19 MED ORDER — PHENYLEPHRINE 80 MCG/ML (10ML) SYRINGE FOR IV PUSH (FOR BLOOD PRESSURE SUPPORT)
80.0000 ug | PREFILLED_SYRINGE | INTRAVENOUS | Status: DC | PRN
  Filled 2023-03-19: qty 10

## 2023-03-19 MED ORDER — LIDOCAINE HCL (PF) 1 % IJ SOLN: 30.0000 mL | INTRAMUSCULAR | Status: DC | PRN

## 2023-03-19 MED ORDER — FLEET ENEMA 7-19 GM/118ML RE ENEM: 1.0000 | ENEMA | Freq: Every day | RECTAL | Status: DC | PRN

## 2023-03-19 MED ORDER — COCONUT OIL OIL: 1.0000 | TOPICAL_OIL | Status: DC | PRN

## 2023-03-19 MED ORDER — OXYCODONE-ACETAMINOPHEN 5-325 MG PO TABS: 1.0000 | ORAL_TABLET | ORAL | Status: DC | PRN

## 2023-03-19 MED ORDER — IBUPROFEN 600 MG PO TABS
600.0000 mg | ORAL_TABLET | Freq: Four times a day (QID) | ORAL | Status: DC
  Administered 2023-03-19 – 2023-03-21 (×7): 600 mg via ORAL
  Filled 2023-03-19 (×6): qty 1

## 2023-03-19 MED ORDER — MISOPROSTOL 50MCG HALF TABLET: 50.0000 ug | ORAL_TABLET | Freq: Once | ORAL | Status: DC

## 2023-03-19 NOTE — Discharge Summary (Signed)
Postpartum Discharge Summary  Date of Service updated***     Patient Name: Alexis Richardson DOB: 2001/05/15 MRN: [id number]  Date of admission: 03/19/2023 Delivery date:03/19/2023  Delivering provider: Myrtie Hawk  Date of discharge: 03/19/2023  Admitting diagnosis: Indication for care in labor or delivery [O75.9] Intrauterine pregnancy: [redacted]w[redacted]d     Secondary diagnosis:  Principal Problem:   Vaginal delivery Active Problems:   History of pulmonary embolus (PE)   Rh negative state in antepartum period   Supervision of high risk pregnancy, antepartum   History of gestational hypertension  Additional problems: ***    Discharge diagnosis: Term Pregnancy Delivered                                              Post partum procedures:{Postpartum procedures:23558} Augmentation: Pitocin Complications: None  Hospital course: Induction of Labor With Vaginal Delivery   22 y.o. yo G2P1001 at [redacted]w[redacted]d was admitted to the hospital 03/19/2023 for induction of labor.  Indication for induction:  h/o PE .  Patient had an labor course complicated by non Membrane Rupture Time/Date: 11:06 AM ,03/19/2023   Delivery Method:Vaginal, Spontaneous  Episiotomy: None  Lacerations:    Details of delivery can be found in separate delivery note.  Patient had a postpartum course complicated by***. Patient is discharged home 03/19/23.  Newborn Data: Birth date:03/19/2023  Birth time:3:45 PM  Gender:Female  Living status:  Apgars: ,  Weight:   Magnesium Sulfate received: No*** BMZ received: No Rhophylac:N/A MMR:N/A T-DaP:Given prenatally Flu: No Transfusion:{Transfusion received:30440034}  Physical exam  Vitals:   03/19/23 1405 03/19/23 1410 03/19/23 1415 03/19/23 1416  BP:    114/82  Pulse:    (!) 103  Resp:      Temp:      TempSrc:      SpO2: 100% 100% 99%   Weight:      Height:       General: {Exam; general:21111117} Lochia: {Desc;  appropriate/inappropriate:30686::"appropriate"} Uterine Fundus: {Desc; firm/soft:30687} Incision: {Exam; incision:21111123} DVT Evaluation: {Exam; dvt:2111122} Labs: Lab Results  Component Value Date   WBC 10.2 03/19/2023   HGB 11.1 (L) 03/19/2023   HCT 33.9 (L) 03/19/2023   MCV 83.7 03/19/2023   PLT 171 03/19/2023      Latest Ref Rng & Units 09/11/2020   12:00 AM  CMP  Glucose 65 - 99 mg/dL 65   BUN 7 - 25 mg/dL 15   Creatinine 4.09 - 1.10 mg/dL 8.11   Sodium 914 - 782 mmol/L 137   Potassium 3.5 - 5.3 mmol/L 4.5   Chloride 98 - 110 mmol/L 102   CO2 20 - 32 mmol/L 22   Calcium 8.6 - 10.2 mg/dL 9.9   Total Protein 6.1 - 8.1 g/dL 7.7   Total Bilirubin 0.2 - 1.2 mg/dL 0.3   AST 10 - 30 U/L 14   ALT 6 - 29 U/L 12    Edinburgh Score:    06/15/2019    8:32 AM  Edinburgh Postnatal Depression Scale Screening Tool  I have been able to laugh and see the funny side of things. 0  I have looked forward with enjoyment to things. 0  I have blamed myself unnecessarily when things went wrong. 2  I have been anxious or worried for no good reason. 0  I have felt scared or panicky for no good reason. 0  Things have been  getting on top of me. 2  I have been so unhappy that I have had difficulty sleeping. 0  I have felt sad or miserable. 0  I have been so unhappy that I have been crying. 0  The thought of harming myself has occurred to me. 0  Edinburgh Postnatal Depression Scale Total 4     After visit meds:  Allergies as of 03/19/2023   No Known Allergies   Med Rec must be completed prior to using this [facility]***        Discharge home in stable condition Infant Feeding: {Baby feeding:23562} Infant Disposition:{CHL IP OB HOME WITH OMVEHM:09470} Discharge instruction: per After Visit Summary and Postpartum booklet. Activity: Advance as tolerated. Pelvic rest for 6 weeks.  Diet: {OB JGGE:36629476} Future Appointments:No future appointments. Follow up Visit: Message sent to  [facility] 4/13  Please schedule this patient for a In person postpartum visit in 6 weeks with the following provider: Any provider. Additional Postpartum F/U: n/a   High risk pregnancy complicated by:  h/o PE Delivery mode:  Vaginal, Spontaneous  Anticipated Birth Control:  Unsure   03/19/2023 Myrtie Hawk, DO

## 2023-03-19 NOTE — Anesthesia Preprocedure Evaluation (Signed)
Anesthesia Evaluation  Patient identified by MRN, date of birth, ID band Patient awake    Reviewed: Allergy & Precautions, NPO status , Patient's Chart, lab work & pertinent test results  History of Anesthesia Complications Negative for: history of anesthetic complications  Airway Mallampati: III  TM Distance: >3 FB Neck ROM: Full    Dental   Pulmonary neg shortness of breath, neg sleep apnea, neg recent URI, PE (2018)   Pulmonary exam normal breath sounds clear to auscultation       Cardiovascular negative cardio ROS  Rhythm:Regular Rate:Normal     Neuro/Psych negative neurological ROS     GI/Hepatic Neg liver ROS,GERD  ,,  Endo/Other    Morbid obesity  Renal/GU negative Renal ROS     Musculoskeletal   Abdominal  (+) + obese  Peds  Hematology  (+) Blood dyscrasia (h/o PE in 2018 attributed to hormal birth control; on Lovenox (last taken 1 week ago))   Anesthesia Other Findings   Reproductive/Obstetrics (+) Pregnancy                             Anesthesia Physical Anesthesia Plan  ASA: 3  Anesthesia Plan: Epidural   Post-op Pain Management:    Induction:   PONV Risk Score and Plan:   Airway Management Planned:   Additional Equipment:   Intra-op Plan:   Post-operative Plan:   Informed Consent: I have reviewed the patients History and Physical, chart, labs and discussed the procedure including the risks, benefits and alternatives for the proposed anesthesia with the patient or authorized representative who has indicated his/her understanding and acceptance.       Plan Discussed with: Anesthesiologist  Anesthesia Plan Comments: (I have discussed risks of neuraxial anesthesia including but not limited to infection, bleeding, nerve injury, back pain, headache, seizures, and failure of block. Patient denies bleeding disorders and is not currently anticoagulated. Labs have  been reviewed. Risks and benefits discussed. All patient's questions answered.  )       Anesthesia Quick Evaluation

## 2023-03-19 NOTE — H&P (Signed)
OBSTETRIC ADMISSION HISTORY AND PHYSICAL  Alexis Richardson is a 22 y.o. female G2P1001 with IUP at [redacted]w[redacted]d by LMP presenting for IOL for h/o PE. She reports +FMs, No LOF, no VB, no blurry vision, headaches or peripheral edema, and RUQ pain.  She plans on Breast and bottle feeding. She request natural family planning for birth control. She received her prenatal care at [facility]   Dating: By LMP --->  Estimated Date of Delivery: 03/26/23  Sono:   @[redacted]w[redacted]d , CWD, normal anatomy, cephalic presentation, 3384g, 81% EFW   Prenatal History/Complications:  Patient Active Problem List   Diagnosis Date Noted   Indication for care in labor or delivery 03/19/2023   Anticoagulated 12/15/2022   Atypical squamous cell changes of undetermined significance (ASCUS) on cervical cytology with negative high risk human papilloma virus (HPV) test result 09/13/2022   History of gestational hypertension 09/07/2022   History of bilateral breast reduction surgery 09/07/2022   Supervision of high risk pregnancy, antepartum 09/01/2022   Rh negative state in antepartum period 10/09/2018   History of pulmonary embolus (PE)     Nursing Staff Provider  Office Location [facility] Dating  LMP/5.6   [facility] Model Arly.Keller ] Traditional [ ]  Centering [ ]  Mom-Baby Dyad Anatomy US  Nml   Language  English     Flu Vaccine  Declined 09/07/22 Genetic/Carrier Screen  NIPS: Low risk female (DNWTK sex of baby)    AFP: negative Horizon: Neg  TDaP Vaccine   02/01/23 Hgb A1C or  GTT Early 5.6 Third trimester normal  COVID Vaccine    LAB RESULTS   Rhogam   12/31/22 Blood Type A/Negative/-- (10/03 1125) A negative  Baby Feeding Plan Breast & Bottle  Antibody Negative (01/26 0857)negative  Contraception Undecided Rubella 1.92 (10/03 1125)immune  Circumcision If boy, Yes  RPR Non Reactive (01/26 0857) negative  Pediatrician  [facility]  HBsAg Negative (10/03 1125) negative  Support Person FOB & MOM  HCVAb Non Reactive (10/03 1125) Neg  Prenatal Classes   HIV Non Reactive (01/26 0857)   negative  BTL Consent NA GBS Negative/-- (04/03 1530)Negative  VBAC Consent NA Pap Diagnosis  Date Value Ref Range Status  09/07/2022 (A)  Final   - Atypical squamous cells of undetermined significance (ASC-US)         DME Rx [x]  BP cuff [x]  Weight Scale Waterbirth  [ ]  Class [ ]  Consent [ ]  CNM visit  PHQ9 & GAD7 [  ] new OB [  ] 28 weeks  [  ] 36 weeks Induction  [ ]  Orders Entered [ ] Foley Y/N    Past Medical History: Past Medical History:  Diagnosis Date   Gestational diabetes 03/26/2019   Neg postpartum GTT   Gestational hypertension 05/03/2019   Pulmonary embolism    a. Dx 09/2017, had IUD removed in case this contributed.   Right ventricular dilation    a. at time of PE 09/2017>11/2027 echo wnl    Past Surgical History: Past Surgical History:  Procedure Laterality Date   BREAST REDUCTION SURGERY Bilateral 01/05/2021   Procedure: BILATERAL MAMMARY REDUCTION;  Surgeon: [name], MD;  Location: [facility];  Service: Plastics;  Laterality: Bilateral;   KELOID EXCISION      Obstetrical History: OB History     Gravida  2   Para  1   Term  1   Preterm  0   AB  0   Living  1      SAB  0   IAB  0   Ectopic  0   Multiple  0   Live Births  1           Social History Social History   Socioeconomic History   Marital status: Single    Spouse name: Not on file   Number of children: Not on file   Years of education: Not on file   Highest education level: Not on file  Occupational History   Not on file  Tobacco Use   Smoking status: Never   Smokeless tobacco: Never  Vaping Use   Vaping Use: Never used  Substance and Sexual Activity   Alcohol use: Not Currently    Comment: occasional   Drug use: Not Currently   Sexual activity: Yes    Birth control/protection: None  Other Topics Concern   Not on file  Social History Narrative   Not on file   Social Determinants of Health    Financial Resource Strain: Not on file  Food Insecurity: No Food Insecurity (03/19/2023)   Hunger Vital Sign    Worried About Running Out of Food in the Last Year: Never true    Ran Out of Food in the Last Year: Never true  Transportation Needs: No Transportation Needs (03/19/2023)   PRAPARE - Administrator, Civil Service (Medical): No    Lack of Transportation (Non-Medical): No  Physical Activity: Not on file  Stress: Not on file  Social Connections: Not on file    Family History: Family History  Problem Relation Age of Onset   Hypertension Mother    Hypertension Maternal Aunt    Diabetes Maternal Grandmother    Cancer Maternal Grandfather    Pulmonary embolism Neg Hx    Heart disease Neg Hx    Asthma Neg Hx    Stroke Neg Hx     Allergies: No Known Allergies  Medications Prior to Admission  Medication Sig Dispense Refill Last Dose   Acetaminophen (TYLENOL 8 HOUR PO) Take by mouth.      aspirin EC 81 MG tablet Take 1 tablet (81 mg total) by mouth daily. Take after 12 weeks for prevention of preeclampssia later in pregnancy 300 tablet 2    Blood Pressure Monitoring (BLOOD PRESSURE KIT) DEVI 1 Device by Does not apply route as needed. 1 each 0    cyclobenzaprine (FLEXERIL) 10 MG tablet Take 1 tablet (10 mg total) by mouth 3 (three) times daily as needed for muscle spasms. (Patient not taking: Reported on 02/15/2023) 30 tablet 2    enoxaparin (LOVENOX) 40 MG/0.4ML injection Inject 0.4 mLs (40 mg total) into the skin daily. 12 mL 3    Misc. Devices (GOJJI WEIGHT SCALE) MISC 1 Device by Does not apply route as needed. 1 each 0    Prenat-FeFum-FePo-FA-Omega 3 (CONCEPT DHA) 53.5-38-1 MG CAPS Take 1 tablet by mouth daily. 30 capsule 12      Review of Systems   All systems reviewed and negative except as stated in HPI  Blood pressure (!) 140/87, pulse 91, temperature 98.2 F (36.8 C), temperature source Oral, resp. rate 18, height  (1.6 m), weight 114.4 kg,  last menstrual period 06/19/2022. General appearance: alert, cooperative, and appears stated age Lungs: clear to auscultation bilaterally Heart: regular rate and rhythm Abdomen: soft, non-tender; bowel sounds normal Pelvic: no lesions Extremities: Homans sign is negative, no sign of DVT Presentation: cephalic Fetal monitoringBaseline: 145 bpm, Variability: Good {> 6 bpm), Accelerations: Reactive, and  Decelerations: Absent Uterine activityFrequency: Every 5-10 minutes Dilation: 4 Effacement (%): 50 Station: -3 Exam by:: Lanae Crumbly MD   Prenatal labs: ABO, Rh: A/Negative/-- (10/03 1125) Antibody: Negative (01/26 0857) Rubella: 1.92 (10/03 1125) RPR: Non Reactive (01/26 0857)  HBsAg: Negative (10/03 1125)  HIV: Non Reactive (01/26 0857)  GBS: Negative/-- (04/03 1530)  1 hr Glucola nml Genetic screening  nml Anatomy US nml  Prenatal Transfer Tool  Maternal Diabetes: No Genetic Screening: Normal Maternal Ultrasounds/Referrals: Normal Fetal Ultrasounds or other Referrals:  None Maternal Substance Abuse:  No Significant Maternal Medications:  Meds include: Other: LOVENOX Significant Maternal Lab Results:  Group B Strep negative Number of Prenatal Visits:greater than 3 verified prenatal visits Other Comments:  None  Results for orders placed or performed during the hospital encounter of 03/19/23 (from the past 24 hour(s))  CBC   Collection Time: 03/19/23  8:35 AM  Result Value Ref Range   WBC 10.2 4.0 - 10.5 K/uL   RBC 4.05 3.87 - 5.11 MIL/uL   Hemoglobin 11.1 (L) 12.0 - 15.0 g/dL   HCT 16.1 (L) 09.6 - 04.5 %   MCV 83.7 80.0 - 100.0 fL   MCH 27.4 26.0 - 34.0 pg   MCHC 32.7 30.0 - 36.0 g/dL   RDW 40.9 81.1 - 91.4 %   Platelets 171 150 - 400 K/uL   nRBC 0.0 0.0 - 0.2 %    Patient Active Problem List   Diagnosis Date Noted   Indication for care in labor or delivery 03/19/2023   Anticoagulated 12/15/2022   Atypical squamous cell changes of undetermined significance  (ASCUS) on cervical cytology with negative high risk human papilloma virus (HPV) test result 09/13/2022   History of gestational hypertension 09/07/2022   History of bilateral breast reduction surgery 09/07/2022   Supervision of high risk pregnancy, antepartum 09/01/2022   Rh negative state in antepartum period 10/09/2018   History of pulmonary embolus (PE)     Assessment/Plan:  Alexis Richardson is a 22 y.o. G2P1001 at [redacted]w[redacted]d here for IOL h/o PE  #Labor: start pitocin 2x2 #Pain: Per pt request, does plan epidural #FWB: CAT 1 #ID:  GBS neg #MOF: Breast/bottle #MOC:natural family planning #h/o PE: thought to be provoked by hormonal birth control, workup neg per hematology. Has been intermittently taking lovenox during pregnancy. She stopped before 4/11. She agrees to continue for 6 weeks pp.  #Rh neg: received rhogam. W/up pp.  #h/o gHTN: BP elevated here, but well controlled in prenatal encounters. CTM.   Lahoma Crocker Mercado-Ortiz, DO  03/19/2023, 9:35 AM

## 2023-03-19 NOTE — Anesthesia Procedure Notes (Signed)
Epidural Patient location during procedure: OB Start time: 03/19/2023 11:40 AM End time: 03/19/2023 11:45 AM  Staffing Anesthesiologist: Linton Rump, MD Performed: anesthesiologist   Preanesthetic Checklist Completed: patient identified, IV checked, site marked, risks and benefits discussed, surgical consent, monitors and equipment checked, pre-op evaluation and timeout performed  Epidural Patient position: sitting Prep: DuraPrep and site prepped and draped Patient monitoring: continuous pulse ox and blood pressure Approach: midline Location: L3-L4 Injection technique: LOR saline  Needle:  Needle type: Tuohy  Needle gauge: 17 G Needle length: 9 cm and 9 Needle insertion depth: 7 cm Catheter type: closed end flexible Catheter size: 19 Gauge Catheter at skin depth: 12 cm Test dose: negative  Assessment Events: blood not aspirated, no cerebrospinal fluid, injection not painful, no injection resistance, no paresthesia and negative IV test  Additional Notes The patient has requested an epidural for labor pain management. Risks and benefits including, but not limited to, infection, bleeding, local anesthetic toxicity, headache, hypotension, back pain, block failure, etc. were discussed with the patient. The patient expressed understanding and consented to the procedure. I confirmed that the patient has no bleeding disorders and is not taking blood thinners. I confirmed the patient's last platelet count with the nurse. A time-out was performed immediately prior to the procedure. Please see nursing documentation for vital signs. Sterile technique was used throughout the whole procedure. Once LOR achieved, the epidural catheter threaded easily without resistance. Aspiration of the catheter was negative for blood and CSF. The epidural was dosed slowly and an infusion was started.  1 attempt(s)Reason for block:procedure for pain

## 2023-03-20 LAB — CBC
HCT: 30.1 % — ABNORMAL LOW (ref 36.0–46.0)
Hemoglobin: 9.8 g/dL — ABNORMAL LOW (ref 12.0–15.0)
MCH: 27.2 pg (ref 26.0–34.0)
MCHC: 32.6 g/dL (ref 30.0–36.0)
MCV: 83.6 fL (ref 80.0–100.0)
Platelets: 141 10*3/uL — ABNORMAL LOW (ref 150–400)
RBC: 3.6 MIL/uL — ABNORMAL LOW (ref 3.87–5.11)
RDW: 14.9 % (ref 11.5–15.5)
WBC: 14.1 10*3/uL — ABNORMAL HIGH (ref 4.0–10.5)
nRBC: 0 % (ref 0.0–0.2)

## 2023-03-20 LAB — RH IG WORKUP (INCLUDES ABO/RH): Unit division: 0

## 2023-03-20 LAB — RPR: RPR Ser Ql: NONREACTIVE

## 2023-03-20 MED ORDER — NIFEDIPINE ER OSMOTIC RELEASE 30 MG PO TB24
30.0000 mg | ORAL_TABLET | Freq: Every day | ORAL | Status: DC
  Administered 2023-03-20 – 2023-03-21 (×2): 30 mg via ORAL
  Filled 2023-03-20 (×2): qty 1

## 2023-03-20 MED ORDER — ENOXAPARIN SODIUM 60 MG/0.6ML IJ SOSY
60.0000 mg | PREFILLED_SYRINGE | INTRAMUSCULAR | Status: DC
  Administered 2023-03-20: 60 mg via SUBCUTANEOUS
  Filled 2023-03-20: qty 0.6

## 2023-03-20 MED ORDER — FUROSEMIDE 20 MG PO TABS
20.0000 mg | ORAL_TABLET | Freq: Every day | ORAL | Status: DC
  Administered 2023-03-20 – 2023-03-21 (×2): 20 mg via ORAL
  Filled 2023-03-20 (×2): qty 1

## 2023-03-20 MED ORDER — RHO D IMMUNE GLOBULIN 1500 UNIT/2ML IJ SOSY
300.0000 ug | PREFILLED_SYRINGE | Freq: Once | INTRAMUSCULAR | Status: AC
  Administered 2023-03-20: 300 ug via INTRAVENOUS
  Filled 2023-03-20: qty 2

## 2023-03-20 MED ORDER — FERROUS SULFATE 325 (65 FE) MG PO TABS
325.0000 mg | ORAL_TABLET | ORAL | Status: DC
  Administered 2023-03-20: 325 mg via ORAL
  Filled 2023-03-20: qty 1

## 2023-03-20 MED FILL — Fentanyl Citrate Preservative Free (PF) Inj 100 MCG/2ML: INTRAMUSCULAR | Qty: 2 | Status: AC

## 2023-03-20 NOTE — Lactation Note (Signed)
This note was copied from a baby's chart. Lactation Consultation Note  Patient Name: Alexis Richardson Date: 03/20/2023 Age:24 hours Reason for consult: Breastfeeding assistance;Term;Follow-up assessment;Infant weight loss (-1.56% weight loss,  Birth Parent wanted latch assistance with her right breast.)  Birth Parent requested latch assistance with her right breast, LC asked Birth Parent to do reverse pressure soften and express small amount of colostrum prior to latch, infant latched on Birth Parent's right breast using the cross cradle hold, LC worked with aligning infant even with breast, breast to chest, providing pillow and hand support, infant sustained latch and was still breastfeeding after 15 minutes when LC left the room. Per Birth Parent infant been breastfeeding most feedings for 15-20 minutes and knows infant may start cluster feeding now that she is past 24 hours old. Infant had 4 voids and 2 stools since birth. Birth Parent will continue to BF infant by cues, on demand, 8 to 12+ times within 24 hours, STS.   Maternal Data    Feeding Mother's Current Feeding Choice: Breast Milk  LATCH Score Latch: Grasps breast easily, tongue down, lips flanged, rhythmical sucking.  Audible Swallowing: A few with stimulation  Type of Nipple: Flat (slight flat but responds well with breast stimulation when doing reverse pressure softening.)  Comfort (Breast/Nipple): Soft / non-tender  Hold (Positioning): Assistance needed to correctly position infant at breast and maintain latch.  LATCH Score: 7   Lactation Tools Discussed/Used    Interventions Interventions: Adjust position;Support pillows;Position options;Education;Breast compression;Reverse pressure  Discharge    Consult Status Consult Status: Follow-up Date: 03/21/23 Follow-up type: In-patient    Frederico Hamman 03/20/2023, 7:53 PM

## 2023-03-20 NOTE — Progress Notes (Signed)
POSTPARTUM PROGRESS NOTE  Post Partum Day 1  Subjective:  Alexis Richardson is a 22 y.o. [id number] s/p VD at [redacted]w[redacted]d.  She reports she is doing well. No acute events overnight. She denies any problems with ambulating, voiding or po intake. Denies nausea or vomiting.  Pain is well controlled.  Lochia is minimal.  Objective: Blood pressure (!) 141/84, pulse 80, temperature 98.3 F (36.8 C), temperature source Oral, resp. rate 18, height 5\' 3"  (1.6 m), weight 114.4 kg, last menstrual period 06/19/2022, SpO2 100 %, unknown if currently breastfeeding.  Physical Exam:  General: alert, cooperative and no distress Chest: no respiratory distress Heart:regular rate, distal pulses intact Abdomen: soft, nontender,  Uterine Fundus: firm, appropriately tender DVT Evaluation: No calf swelling or tenderness Extremities: no edema Skin: warm, dry  Recent Labs    03/19/23 1645 03/20/23 0512  HGB 11.5* 9.8*  HCT 35.0* 30.1*    Assessment/Plan: Alexis Richardson is a 22 y.o. [id number] s/p VD at [redacted]w[redacted]d   PPD#1 - Doing well  Routine postpartum care Postpartum hypertension: blood pressures persistently elevated. Start PO nifedipine XL 30mg  daily, lasix 20mg  daily Mild acute blood loss anemia: Start PO iron every other day. Contraception: NFP History of DVT: on lovenox and will continue for 6 weeks post partum Feeding: bottle Dispo: Plan for discharge tomorrow.   LOS: 1 day   Sheppard Evens MD MPH OB Fellow, Faculty Practice [facility], [facility] 03/20/2023

## 2023-03-20 NOTE — Anesthesia Postprocedure Evaluation (Signed)
Anesthesia Post Note  Patient: Alexis Richardson  Procedure(s) Performed: AN AD HOC LABOR EPIDURAL     Patient location during evaluation: Mother Baby Anesthesia Type: Epidural Level of consciousness: awake and alert Pain management: pain level controlled Vital Signs Assessment: post-procedure vital signs reviewed and stable Respiratory status: spontaneous breathing, nonlabored ventilation and respiratory function stable Cardiovascular status: stable Postop Assessment: no headache, no backache, epidural receding, no apparent nausea or vomiting, patient able to bend at knees, able to ambulate and adequate PO intake Anesthetic complications: no   No notable events documented.  Last Vitals:  Vitals:   03/20/23 0400 03/20/23 0812  BP: (!) 142/88 (!) 141/84  Pulse: 88 80  Resp: 18 18  Temp: 36.8 C 36.8 C  SpO2: 100% 100%    Last Pain:  Vitals:   03/20/23 0812  TempSrc: Oral  PainSc: 0-No pain   Pain Goal: Patients Stated Pain Goal: 0 (03/19/23 1256)              Epidural/Spinal Function Cutaneous sensation: Normal sensation (03/20/23 0812), Patient able to flex knees: Yes (03/20/23 3235), Patient able to lift hips off bed: Yes (03/20/23 0812), Back pain beyond tenderness at insertion site: No (03/20/23 0812), Progressively worsening motor and/or sensory loss: No (03/20/23 5732), Bowel and/or bladder incontinence post epidural: No (03/20/23 0812)  Laban Emperor

## 2023-03-20 NOTE — Lactation Note (Addendum)
This note was copied from a baby's chart. Lactation Consultation Note  Patient Name: Alexis Richardson [id number] Date: 03/20/2023 Age:24 hours Reason for consult: Initial assessment;Term Mom stated BF her 1st child now 37 yrs old for 5 months. Mom having trouble latching w/flat nipples. Very compressible. Colostrum noted. Baby needs to feel gloved finger to gums before starts suckling at this time. Baby can latch once she starts suckling. Baby will at times act as if she doesn't know what to do. She has to feel finger before suckling. Mom denies painful latch. Newborn feeding habits, STS, I&O, positioning and support reviewed. Discussed w/mom using DEBP d/t breast reduction. Mom in agreement. Encouraged mom to call for assistance as needed. Maternal Data Has patient been taught Hand Expression?: Yes Does the patient have breastfeeding experience prior to this delivery?: Yes How long did the patient breastfeed?: 5 months to her now 24 yr old  Feeding    LATCH Score Latch: Repeated attempts needed to sustain latch, nipple held in mouth throughout feeding, stimulation needed to elicit sucking reflex.  Audible Swallowing: None  Type of Nipple: Flat  Comfort (Breast/Nipple): Soft / non-tender  Hold (Positioning): Assistance needed to correctly position infant at breast and maintain latch.  LATCH Score: 5   Lactation Tools Discussed/Used    Interventions Interventions: Breast feeding basics reviewed;Adjust position;Assisted with latch;Support pillows;Skin to skin;Position options;Breast massage;Shells;Pre-pump if needed;Breast compression;LC Services brochure;Hand express  Discharge    Consult Status Consult Status: Follow-up Date: 03/20/23 (in pm) Follow-up type: In-patient    Charyl Dancer 03/20/2023, 2:35 AM

## 2023-03-21 ENCOUNTER — Other Ambulatory Visit (HOSPITAL_COMMUNITY): Payer: Self-pay

## 2023-03-21 DIAGNOSIS — O165 Unspecified maternal hypertension, complicating the puerperium: Secondary | ICD-10-CM | POA: Insufficient documentation

## 2023-03-21 LAB — RH IG WORKUP (INCLUDES ABO/RH)
Fetal Screen: NEGATIVE
Gestational Age(Wks): 39

## 2023-03-21 MED ORDER — ENOXAPARIN SODIUM 60 MG/0.6ML IJ SOSY
60.0000 mg | PREFILLED_SYRINGE | INTRAMUSCULAR | 0 refills | Status: DC
  Filled 2023-03-21: qty 20.4, 34d supply, fill #0

## 2023-03-21 MED ORDER — IBUPROFEN 600 MG PO TABS
600.0000 mg | ORAL_TABLET | Freq: Three times a day (TID) | ORAL | 0 refills | Status: DC | PRN
  Filled 2023-03-21: qty 30, 10d supply, fill #0

## 2023-03-21 MED ORDER — FERROUS SULFATE 325 (65 FE) MG PO TABS
325.0000 mg | ORAL_TABLET | ORAL | 0 refills | Status: DC
  Filled 2023-03-21: qty 30, 60d supply, fill #0

## 2023-03-21 MED ORDER — FUROSEMIDE 20 MG PO TABS
20.0000 mg | ORAL_TABLET | Freq: Every day | ORAL | 0 refills | Status: DC
  Filled 2023-03-21: qty 5, 5d supply, fill #0

## 2023-03-21 MED ORDER — NIFEDIPINE ER 30 MG PO TB24
30.0000 mg | ORAL_TABLET | Freq: Every day | ORAL | 0 refills | Status: DC
  Filled 2023-03-21: qty 60, 60d supply, fill #0

## 2023-03-21 NOTE — Discharge Instructions (Signed)
-   Continue your prenatal vitamins especially if breastfeeding - Try to eat iron rich food. - Take over the counter tylenol (500mg ) or ibuprofen (200mg ) three times a day as needed for cramping/pain. - Please continue lovenox daily until 6 weeks post partum - follow up in clinic in 4-6 weeks as scheduled for your regular post partum visit. - Please come back to [facility] if you notice persistently elevated blood pressures or you start to have a headache, that doesn't get better with medications (tylenol and ibuprofen), rest (4hrs of sleep) and drinking water.

## 2023-03-21 NOTE — Lactation Note (Signed)
This note was copied from a baby's chart. Lactation Consultation Note  Patient Name: Alexis RichardsonH Date: 03/21/2023 Age:24 hours Reason for consult: Follow-up assessment  P2, Mother states baby is very eager when she is hungry and it takes a few attempts to latch baby.  Suggest hand expressing or prepumping to help baby latch.  Be sure to offer both breasts when feeding.  Reviewed engorgement care and monitoring voids/stools.  Feeding Mother's Current Feeding Choice: Breast Milk Lactation Tools Discussed/Used  Manual pump  Interventions Interventions: Education  Discharge Discharge Education: Engorgement and breast care;Warning signs for feeding baby  Consult Status Consult Status: Complete Date: 03/21/23    Dahlia Byes [facility] 03/21/2023, 12:01 PM

## 2023-03-28 ENCOUNTER — Ambulatory Visit (INDEPENDENT_AMBULATORY_CARE_PROVIDER_SITE_OTHER): Payer: Medicaid Other

## 2023-03-28 ENCOUNTER — Other Ambulatory Visit: Payer: Self-pay

## 2023-03-28 VITALS — BP 134/82 | HR 96 | Wt 232.0 lb

## 2023-03-28 DIAGNOSIS — O165 Unspecified maternal hypertension, complicating the puerperium: Secondary | ICD-10-CM

## 2023-03-28 DIAGNOSIS — Z0130 Encounter for examination of blood pressure without abnormal findings: Secondary | ICD-10-CM

## 2023-03-28 MED ORDER — LABETALOL HCL 200 MG PO TABS
200.0000 mg | ORAL_TABLET | Freq: Two times a day (BID) | ORAL | 0 refills | Status: DC
Start: 2023-03-28 — End: 2024-01-10

## 2023-03-28 NOTE — Progress Notes (Signed)
Blood Pressure Check Visit  Alexis Richardson is here for blood pressure check following {Desc; delivery type:31066} on ***. BP today is ***. Patient denies/endorses any {Symptoms; hypertension cc:11129:s:"dizzness","blurred vision","headache","shortness of breath","peripheral edema"}. Reviewed with ***.  Marjo Bicker, RN 03/28/2023  3:09 PM   7 12 , 7 2 then 7.25 then 7.4   Headache  Nifedipine , mornings   4/13  Labetalol 200 BID

## 2023-04-04 ENCOUNTER — Ambulatory Visit: Payer: Medicaid Other

## 2023-04-18 ENCOUNTER — Ambulatory Visit: Payer: Self-pay

## 2023-05-05 ENCOUNTER — Ambulatory Visit: Payer: Medicaid Other | Admitting: Plastic Surgery

## 2023-05-10 ENCOUNTER — Encounter: Payer: Self-pay | Admitting: Obstetrics and Gynecology

## 2023-05-11 NOTE — Telephone Encounter (Signed)
I have this printed for you

## 2023-05-18 ENCOUNTER — Ambulatory Visit (INDEPENDENT_AMBULATORY_CARE_PROVIDER_SITE_OTHER): Payer: Medicaid Other | Admitting: Obstetrics and Gynecology

## 2023-05-18 ENCOUNTER — Other Ambulatory Visit: Payer: Self-pay

## 2023-05-18 ENCOUNTER — Encounter: Payer: Self-pay | Admitting: Obstetrics and Gynecology

## 2023-05-18 DIAGNOSIS — Z86711 Personal history of pulmonary embolism: Secondary | ICD-10-CM | POA: Diagnosis not present

## 2023-05-18 DIAGNOSIS — O165 Unspecified maternal hypertension, complicating the puerperium: Secondary | ICD-10-CM

## 2023-05-18 DIAGNOSIS — Z0289 Encounter for other administrative examinations: Secondary | ICD-10-CM

## 2023-05-18 NOTE — Progress Notes (Signed)
Post Partum Visit Note  Alexis Richardson is a 22 y.o. [id number] female who presents for a postpartum visit. She is 8 weeks 4 days postpartum following a normal spontaneous vaginal delivery.  I have fully reviewed the prenatal and intrapartum course. The delivery was at 39 gestational weeks.  Anesthesia: epidural. Postpartum course has been good. Baby is doing well. Baby is feeding by breast. Bleeding no bleeding. Bowel function is normal. Bladder function is normal. Patient is not sexually active. Contraception method is none. Postpartum depression screening: negative.   The pregnancy intention screening data noted above was reviewed. Potential methods of contraception were discussed. The patient elected to proceed with No data recorded.    Health Maintenance Due  Topic Date Due   COVID-19 Vaccine (1) Never done   HPV VACCINES (1 - 2-dose series) Never done    The following portions of the patient's history were reviewed and updated as appropriate: allergies, current medications, past family history, past medical history, past social history, past surgical history, and problem list.  Review of Systems Pertinent items are noted in HPI.  Objective:  LMP 06/19/2022    General:  alert, cooperative, and no distress   Breasts:  not indicated  Lungs: clear to auscultation bilaterally  Heart:  regular rate and rhythm, S1, S2 normal, no murmur, click, rub or gallop  Abdomen: soft, non-tender; bowel sounds normal; no masses,  no organomegaly   Wound N/a  GU exam:  not indicated       Assessment:   1. Postpartum state Doing well, return to usual activities  2. History of pulmonary embolus (PE) Did not complete LVX course, no signs or symptoms of repeat PE  3. Postpartum hypertension BP ok on recheck    Normal  postpartum exam.   Plan:   Essential components of care per ACOG recommendations:  1.  Mood and well being: Patient with negative depression screening today. Reviewed  local resources for support.  - Patient tobacco use? No.   - hx of drug use? No.    2. Infant care and feeding:  -Patient currently breastmilk feeding? Yes. Reviewed importance of draining breast regularly to support lactation.  -Social determinants of health (SDOH) reviewed in EPIC. No concerns  3. Sexuality, contraception and birth spacing - Patient does not want a pregnancy in the next year.  Desired family size is 3 children.  - Reviewed reproductive life planning. Reviewed contraceptive methods based on pt preferences and effectiveness.  Patient desired Female Condom and Withdrawal or Other Method today.   - Discussed birth spacing of 18 months  4. Sleep and fatigue -Encouraged family/partner/community support of 4 hrs of uninterrupted sleep to help with mood and fatigue  5. Physical Recovery  - Discussed patients delivery and complications. She describes her labor as good. - Patient had a Vaginal, no problems at delivery. Patient had no laceration. Perineal healing reviewed. Patient expressed understanding - Patient has urinary incontinence? No. - Patient is safe to resume physical and sexual activity  6.  Health Maintenance - HM due items addressed Yes - Last pap smear  Diagnosis  Date Value Ref Range Status  09/07/2022 (A)  Final   - Atypical squamous cells of undetermined significance (ASC-US)   Pap smear not done at today's visit.  -Breast Cancer screening indicated? No.   7. Chronic Disease/Pregnancy Condition follow up: Hypertension  - PCP follow up  B'Aisha Maricela Bo, CMA [facility] [employer], [facility]   Adalind Weitz  Briscoe Deutscher, MD, FACOG Minimally Invasive Gynecologic Surgery  Obstetrics and Gynecology, [facility] for [facility], [facility] 05/19/2023

## 2023-07-28 ENCOUNTER — Ambulatory Visit (INDEPENDENT_AMBULATORY_CARE_PROVIDER_SITE_OTHER): Payer: Medicaid Other | Admitting: Plastic Surgery

## 2023-07-28 DIAGNOSIS — L910 Hypertrophic scar: Secondary | ICD-10-CM | POA: Diagnosis not present

## 2023-07-28 DIAGNOSIS — N6310 Unspecified lump in the right breast, unspecified quadrant: Secondary | ICD-10-CM

## 2023-07-28 DIAGNOSIS — N6315 Unspecified lump in the right breast, overlapping quadrants: Secondary | ICD-10-CM

## 2023-07-28 NOTE — Progress Notes (Signed)
Procedure Note  Preoperative Dx: Keloids x 4  Postoperative Dx: Same  Procedure: Injection of keloids with steroids  Anesthesia: Lidocaine 1%   Indication for Procedure: Treat keloids at umbilicus and left lateral breast reduction incision  Description of Procedure: Risks and complications were explained to the patient including hypopigmentation of the skin and need for additional injections.  Consent was confirmed and the patient understands the risks and benefits.  The potential complications and alternatives were explained and the patient consents.  The patient expressed understanding the option of not having the procedure and the risks of a scar.  Time out was called and all information was confirmed to be correct.    Each of the keloids was injected with a 50-50 mixture of Kenalog 40 and 1% plain lidocaine.  A total of 2.5 mL of the steroid solution was injected.  Alexis Richardson tolerated the procedure well and there were no complications.  Alexis Richardson also showed me an area at the 12 o'clock position of her right areola where she has a 1 cm mass in the subcutaneous tissues.  On palpation this feels consistent with a cyst.  It did however develop while she was pregnant and has enlarged in size.  Prior to excising the mass I would like for her to have a mammogram to rule out any pathology.  Return in 4-6 weeks

## 2023-08-31 ENCOUNTER — Ambulatory Visit: Payer: Medicaid Other | Admitting: Plastic Surgery

## 2023-09-07 ENCOUNTER — Inpatient Hospital Stay: Admission: RE | Admit: 2023-09-07 | Payer: Medicaid Other | Source: Ambulatory Visit

## 2023-09-23 ENCOUNTER — Ambulatory Visit
Admission: RE | Admit: 2023-09-23 | Discharge: 2023-09-23 | Disposition: A | Discharge status: Home / Self Care | Payer: Medicaid Other | Source: Ambulatory Visit | Attending: Plastic Surgery

## 2023-09-23 DIAGNOSIS — N6310 Unspecified lump in the right breast, unspecified quadrant: Secondary | ICD-10-CM

## 2023-11-02 ENCOUNTER — Ambulatory Visit: Payer: Medicaid Other | Admitting: Plastic Surgery

## 2023-11-09 ENCOUNTER — Ambulatory Visit: Payer: Medicaid Other | Admitting: Plastic Surgery

## 2023-11-09 ENCOUNTER — Encounter: Payer: Self-pay | Admitting: Plastic Surgery

## 2023-11-09 VITALS — BP 119/91 | HR 66

## 2023-11-09 DIAGNOSIS — N6310 Unspecified lump in the right breast, unspecified quadrant: Secondary | ICD-10-CM

## 2023-11-09 DIAGNOSIS — L910 Hypertrophic scar: Secondary | ICD-10-CM

## 2023-11-09 NOTE — Progress Notes (Signed)
Ms. Zasada returns today for evaluation of her left breast reduction scar and to discuss the findings of the right breast ultrasound.  She has previously undergone injection of the left lateral inframammary scar due to hypertrophy.  There is a small mass behind the right areola which was ultrasounded and was thought to be benign with a BI-RADS 2 finding.  On discussion she would like to have the mass behind the areola excised both for pathologic diagnosis and to improve the appearance.  I told her at the same time that we could consider revising the scar on the left inframammary incision.  She would like to proceed with both.  Risks of bleeding and infection and no improvement or worsening of the scars was discussed she understands this.  Photographs were obtained today with her consent.  Will schedule her for excision of the retroareolar mass and revision of the left scar.

## 2023-12-14 ENCOUNTER — Encounter: Payer: Medicaid Other | Admitting: Physician Assistant

## 2023-12-27 NOTE — H&P (View-Only) (Signed)
 Patient ID: Alexis Richardson, female    DOB: Dec 08, 2000, 23 y.o.   MRN: [id number]  No chief complaint on file.   No diagnosis found.   History of Present Illness: Alexis Richardson is a 23 y.o.  female  with a history of breast reduction surgery.  She presents for preoperative evaluation for upcoming procedure, excision of right retroareolar mass and revision of left inframammary scar, scheduled for 01/10/2024 with Dr.  Ladona Ridgel .  The patient {HAS HAS WUJ:[id number]} had problems with anesthesia. ***  Summary of Previous Visit: Patient had breast reduction surgery 01/05/2021 and subsequently developed left inframammary fold scarring and right breast mass for which she consulted with Dr. Ladona Ridgel.  Ultrasound of right breast mass was benign.  Plan also provide of left inframammary incision.  Job: ***  PMH Significant for: History of breast reduction surgery 2022, pulmonary embolism on Lovenox, gestational hypertension.   Past Medical History: Allergies: No Known Allergies  Current Medications:  Current Outpatient Medications:    enoxaparin (LOVENOX) 60 MG/0.6ML injection, Inject 0.6 mLs (60 mg total) into the skin daily., Disp: 25.2 mL, Rfl: 0   ferrous sulfate 325 (65 FE) MG tablet, Take 1 tablet (325 mg total) by mouth every other day., Disp: 30 tablet, Rfl: 0   ibuprofen (ADVIL) 600 MG tablet, Take 1 tablet (600 mg total) by mouth every 8 (eight) hours as needed. Take with meals, Disp: 30 tablet, Rfl: 0   labetalol (NORMODYNE) 200 MG tablet, Take 1 tablet (200 mg total) by mouth 2 (two) times daily., Disp: 60 tablet, Rfl: 0   NIFEdipine (ADALAT CC) 30 MG 24 hr tablet, Take 1 tablet (30 mg total) by mouth daily., Disp: 60 tablet, Rfl: 0   Prenat-FeFum-FePo-FA-Omega 3 (CONCEPT DHA) 53.5-38-1 MG CAPS, Take 1 tablet by mouth daily., Disp: 30 capsule, Rfl: 12  Past Medical Problems: Past Medical History:  Diagnosis Date   Gestational diabetes 03/26/2019   Neg postpartum GTT   Gestational  hypertension 05/03/2019   Pulmonary embolism (HCC)    a. Dx 09/2017, had IUD removed in case this contributed.   Right ventricular dilation    a. at time of PE 09/2017>11/2027 echo wnl    Past Surgical History: Past Surgical History:  Procedure Laterality Date   BREAST REDUCTION SURGERY Bilateral 01/05/2021   Procedure: BILATERAL MAMMARY REDUCTION;  Surgeon: [name], MD;  Location: [facility];  Service: Plastics;  Laterality: Bilateral;   KELOID EXCISION      Social History: Social History   Socioeconomic History   Marital status: Single    Spouse name: Not on file   Number of children: Not on file   Years of education: Not on file   Highest education level: Not on file  Occupational History   Not on file  Tobacco Use   Smoking status: Never   Smokeless tobacco: Never  Vaping Use   Vaping status: Never Used  Substance and Sexual Activity   Alcohol use: Not Currently    Comment: occasional   Drug use: Not Currently   Sexual activity: Yes    Birth control/protection: None  Other Topics Concern   Not on file  Social History Narrative   Not on file   Social Drivers of Health   Financial Resource Strain: Not on file  Food Insecurity: No Food Insecurity (03/19/2023)   Hunger Vital Sign    Worried About Running Out of Food in the Last Year: Never true    Ran  Out of Food in the Last Year: Never true  Transportation Needs: No Transportation Needs (03/19/2023)   PRAPARE - Administrator, Civil Service (Medical): No    Lack of Transportation (Non-Medical): No  Physical Activity: Not on file  Stress: Not on file  Social Connections: Not on file  Intimate Partner Violence: Not At Risk (03/19/2023)   Humiliation, Afraid, Rape, and Kick questionnaire    Fear of Current or Ex-Partner: No    Emotionally Abused: No    Physically Abused: No    Sexually Abused: No    Family History: Family History  Problem Relation Age of Onset    Hypertension Mother    Hypertension Maternal Aunt    Diabetes Maternal Grandmother    Cancer Maternal Grandfather    Pulmonary embolism Neg Hx    Heart disease Neg Hx    Asthma Neg Hx    Stroke Neg Hx     Review of Systems: ROS  Physical Exam: Vital Signs There were no vitals taken for this visit.  Physical Exam *** Constitutional:      General: Not in acute distress.    Appearance: Normal appearance. Not ill-appearing.  HENT:     Head: Normocephalic and atraumatic.  Eyes:     Pupils: Pupils are equal, round. Cardiovascular:     Rate and Rhythm: Normal rate.    Pulses: Normal pulses.  Pulmonary:     Effort: No respiratory distress or increased work of breathing.  Speaks in full sentences. Abdominal:     General: Abdomen is flat. No distension.   Musculoskeletal: Normal range of motion. No lower extremity swelling or edema. No varicosities. *** Skin:    General: Skin is warm and dry.     Findings: No erythema or rash.  Neurological:     Mental Status: Alert and oriented to person, place, and time.  Psychiatric:        Mood and Affect: Mood normal.        Behavior: Behavior normal.    Assessment/Plan: The patient is scheduled for *** with Dr. Jenean Lindau.  Risks, benefits, and alternatives of procedure discussed, questions answered and consent obtained.    Smoking Status: ***; Counseling Given? *** Last Mammogram: ***; Results: ***  Caprini Score: ***; Risk Factors include: ***, BMI *** 25, and length of planned surgery. Recommendation for mechanical *** pharmacological prophylaxis. Encourage early ambulation.   Pictures obtained: ***  Post-op Rx sent to pharmacy: ***  Patient was provided with the *** General Surgical Risk consent document and Pain Medication Agreement prior to their appointment.  They had adequate time to read through the risk consent documents and Pain Medication Agreement. We also discussed them in person  together during this preop appointment. All of their questions were answered to their satisfaction.  Recommended calling if they have any further questions.  Risk consent form and Pain Medication Agreement to be scanned into patient's chart.  ***   Electronically signed by: Evelena Leyden, PA-C 12/27/2023 12:53 PM

## 2023-12-27 NOTE — Progress Notes (Signed)
Patient ID: Alexis Richardson, female    DOB: Dec 08, 2000, 23 y.o.   MRN: [id number]  No chief complaint on file.   No diagnosis found.   History of Present Illness: Alexis Richardson is a 23 y.o.  female  with a history of breast reduction surgery.  She presents for preoperative evaluation for upcoming procedure, excision of right retroareolar mass and revision of left inframammary scar, scheduled for 01/10/2024 with Dr.  Ladona Ridgel .  The patient {HAS HAS [id number]} had problems with anesthesia. ***  Summary of Previous Visit: Patient had breast reduction surgery 01/05/2021 and subsequently developed left inframammary fold scarring and right breast mass for which she consulted with Dr. Ladona Ridgel.  Ultrasound of right breast mass was benign.  Plan also provide of left inframammary incision.  Job: ***  PMH Significant for: History of breast reduction surgery 2022, pulmonary embolism on Lovenox, gestational hypertension.   Past Medical History: Allergies: No Known Allergies  Current Medications:  Current Outpatient Medications:    enoxaparin (LOVENOX) 60 MG/0.6ML injection, Inject 0.6 mLs (60 mg total) into the skin daily., Disp: 25.2 mL, Rfl: 0   ferrous sulfate 325 (65 FE) MG tablet, Take 1 tablet (325 mg total) by mouth every other day., Disp: 30 tablet, Rfl: 0   ibuprofen (ADVIL) 600 MG tablet, Take 1 tablet (600 mg total) by mouth every 8 (eight) hours as needed. Take with meals, Disp: 30 tablet, Rfl: 0   labetalol (NORMODYNE) 200 MG tablet, Take 1 tablet (200 mg total) by mouth 2 (two) times daily., Disp: 60 tablet, Rfl: 0   NIFEdipine (ADALAT CC) 30 MG 24 hr tablet, Take 1 tablet (30 mg total) by mouth daily., Disp: 60 tablet, Rfl: 0   Prenat-FeFum-FePo-FA-Omega 3 (CONCEPT DHA) 53.5-38-1 MG CAPS, Take 1 tablet by mouth daily., Disp: 30 capsule, Rfl: 12  Past Medical Problems: Past Medical History:  Diagnosis Date   Gestational diabetes 03/26/2019   Neg postpartum GTT   Gestational  hypertension 05/03/2019   Pulmonary embolism (HCC)    a. Dx 09/2017, had IUD removed in case this contributed.   Right ventricular dilation    a. at time of PE 09/2017>11/2027 echo wnl    Past Surgical History: Past Surgical History:  Procedure Laterality Date   BREAST REDUCTION SURGERY Bilateral 01/05/2021   Procedure: BILATERAL MAMMARY REDUCTION;  Surgeon: [name], MD;  Location: [facility];  Service: Plastics;  Laterality: Bilateral;   KELOID EXCISION      Social History: Social History   Socioeconomic History   Marital status: Single    Spouse name: Not on file   Number of children: Not on file   Years of education: Not on file   Highest education level: Not on file  Occupational History   Not on file  Tobacco Use   Smoking status: Never   Smokeless tobacco: Never  Vaping Use   Vaping status: Never Used  Substance and Sexual Activity   Alcohol use: Not Currently    Comment: occasional   Drug use: Not Currently   Sexual activity: Yes    Birth control/protection: None  Other Topics Concern   Not on file  Social History Narrative   Not on file   Social Drivers of Health   Financial Resource Strain: Not on file  Food Insecurity: No Food Insecurity (03/19/2023)   Hunger Vital Sign    Worried About Running Out of Food in the Last Year: Never true    Ran  Out of Food in the Last Year: Never true  Transportation Needs: No Transportation Needs (03/19/2023)   PRAPARE - Administrator, Civil Service (Medical): No    Lack of Transportation (Non-Medical): No  Physical Activity: Not on file  Stress: Not on file  Social Connections: Not on file  Intimate Partner Violence: Not At Risk (03/19/2023)   Humiliation, Afraid, Rape, and Kick questionnaire    Fear of Current or Ex-Partner: No    Emotionally Abused: No    Physically Abused: No    Sexually Abused: No    Family History: Family History  Problem Relation Age of Onset    Hypertension Mother    Hypertension Maternal Aunt    Diabetes Maternal Grandmother    Cancer Maternal Grandfather    Pulmonary embolism Neg Hx    Heart disease Neg Hx    Asthma Neg Hx    Stroke Neg Hx     Review of Systems: ROS  Physical Exam: Vital Signs There were no vitals taken for this visit.  Physical Exam *** Constitutional:      General: Not in acute distress.    Appearance: Normal appearance. Not ill-appearing.  HENT:     Head: Normocephalic and atraumatic.  Eyes:     Pupils: Pupils are equal, round. Cardiovascular:     Rate and Rhythm: Normal rate.    Pulses: Normal pulses.  Pulmonary:     Effort: No respiratory distress or increased work of breathing.  Speaks in full sentences. Abdominal:     General: Abdomen is flat. No distension.   Musculoskeletal: Normal range of motion. No lower extremity swelling or edema. No varicosities. *** Skin:    General: Skin is warm and dry.     Findings: No erythema or rash.  Neurological:     Mental Status: Alert and oriented to person, place, and time.  Psychiatric:        Mood and Affect: Mood normal.        Behavior: Behavior normal.    Assessment/Plan: The patient is scheduled for *** with Dr. Jenean Lindau.  Risks, benefits, and alternatives of procedure discussed, questions answered and consent obtained.    Smoking Status: ***; Counseling Given? *** Last Mammogram: ***; Results: ***  Caprini Score: ***; Risk Factors include: ***, BMI *** 25, and length of planned surgery. Recommendation for mechanical *** pharmacological prophylaxis. Encourage early ambulation.   Pictures obtained: ***  Post-op Rx sent to pharmacy: ***  Patient was provided with the *** General Surgical Risk consent document and Pain Medication Agreement prior to their appointment.  They had adequate time to read through the risk consent documents and Pain Medication Agreement. We also discussed them in person  together during this preop appointment. All of their questions were answered to their satisfaction.  Recommended calling if they have any further questions.  Risk consent form and Pain Medication Agreement to be scanned into patient's chart.  ***   Electronically signed by: Evelena Leyden, PA-C 12/27/2023 12:53 PM

## 2023-12-28 ENCOUNTER — Ambulatory Visit: Payer: Medicaid Other | Admitting: Physician Assistant

## 2023-12-28 VITALS — BP 144/88 | HR 92

## 2023-12-28 DIAGNOSIS — N6310 Unspecified lump in the right breast, unspecified quadrant: Secondary | ICD-10-CM

## 2023-12-28 DIAGNOSIS — L910 Hypertrophic scar: Secondary | ICD-10-CM

## 2023-12-28 MED ORDER — ENOXAPARIN SODIUM 40 MG/0.4ML IJ SOSY: 40.0000 mg | PREFILLED_SYRINGE | INTRAMUSCULAR | 0 refills | Status: DC

## 2023-12-28 MED ORDER — ONDANSETRON 4 MG PO TBDP: 4.0000 mg | ORAL_TABLET | Freq: Three times a day (TID) | ORAL | 0 refills | Status: DC | PRN

## 2023-12-28 MED ORDER — OXYCODONE HCL 5 MG PO TABS: 5.0000 mg | ORAL_TABLET | Freq: Three times a day (TID) | ORAL | 0 refills | Status: AC | PRN

## 2024-01-04 ENCOUNTER — Encounter (HOSPITAL_BASED_OUTPATIENT_CLINIC_OR_DEPARTMENT_OTHER): Payer: Self-pay | Admitting: Plastic Surgery

## 2024-01-04 ENCOUNTER — Other Ambulatory Visit: Payer: Self-pay

## 2024-01-09 NOTE — Anesthesia Preprocedure Evaluation (Signed)
Anesthesia Evaluation  Patient identified by MRN, date of birth, ID band Patient awake    Reviewed: Allergy & Precautions, NPO status , Patient's Chart, lab work & pertinent test results  Airway Mallampati: III  TM Distance: >3 FB Neck ROM: Full    Dental no notable dental hx. (+) Teeth Intact, Dental Advisory Given   Pulmonary neg pulmonary ROS   Pulmonary exam normal breath sounds clear to auscultation       Cardiovascular hypertension, Pt. on medications and Pt. on home beta blockers Normal cardiovascular exam Rhythm:Regular Rate:Normal     Neuro/Psych negative neurological ROS  negative psych ROS   GI/Hepatic Neg liver ROS, hiatal hernia,,,  Endo/Other  diabetes    Renal/GU      Musculoskeletal negative musculoskeletal ROS (+)    Abdominal  (+) + obese (BMI 39)  Peds  Hematology   Anesthesia Other Findings   Reproductive/Obstetrics                             Anesthesia Physical Anesthesia Plan  ASA: 3  Anesthesia Plan: General   Post-op Pain Management: Ofirmev IV (intra-op)* and Precedex   Induction: Intravenous  PONV Risk Score and Plan: 4 or greater and Treatment may vary due to age or medical condition, Ondansetron and Midazolam  Airway Management Planned: LMA  Additional Equipment: None  Intra-op Plan:   Post-operative Plan: Extubation in OR  Informed Consent: I have reviewed the patients History and Physical, chart, labs and discussed the procedure including the risks, benefits and alternatives for the proposed anesthesia with the patient or authorized representative who has indicated his/her understanding and acceptance.     Dental advisory given  Plan Discussed with: CRNA and Surgeon  Anesthesia Plan Comments:         Anesthesia Quick Evaluation

## 2024-01-10 ENCOUNTER — Encounter (HOSPITAL_BASED_OUTPATIENT_CLINIC_OR_DEPARTMENT_OTHER): Payer: Self-pay | Admitting: Plastic Surgery

## 2024-01-10 ENCOUNTER — Ambulatory Visit (HOSPITAL_BASED_OUTPATIENT_CLINIC_OR_DEPARTMENT_OTHER): Payer: Self-pay | Admitting: Anesthesiology

## 2024-01-10 ENCOUNTER — Other Ambulatory Visit: Payer: Self-pay

## 2024-01-10 ENCOUNTER — Ambulatory Visit (HOSPITAL_BASED_OUTPATIENT_CLINIC_OR_DEPARTMENT_OTHER): Payer: Medicaid Other | Admitting: Anesthesiology

## 2024-01-10 ENCOUNTER — Encounter (HOSPITAL_BASED_OUTPATIENT_CLINIC_OR_DEPARTMENT_OTHER)
Admission: RE | Disposition: A | Discharge status: Home / Self Care | Payer: Self-pay | Source: Home / Self Care | Attending: Plastic Surgery

## 2024-01-10 ENCOUNTER — Ambulatory Visit (HOSPITAL_BASED_OUTPATIENT_CLINIC_OR_DEPARTMENT_OTHER)
Admission: RE | Admit: 2024-01-10 | Discharge: 2024-01-10 | Disposition: A | Discharge status: Home / Self Care | Payer: Medicaid Other | Attending: Plastic Surgery | Admitting: Plastic Surgery

## 2024-01-10 DIAGNOSIS — N6032 Fibrosclerosis of left breast: Secondary | ICD-10-CM | POA: Diagnosis not present

## 2024-01-10 DIAGNOSIS — N6310 Unspecified lump in the right breast, unspecified quadrant: Secondary | ICD-10-CM

## 2024-01-10 DIAGNOSIS — E119 Type 2 diabetes mellitus without complications: Secondary | ICD-10-CM | POA: Insufficient documentation

## 2024-01-10 DIAGNOSIS — Z7901 Long term (current) use of anticoagulants: Secondary | ICD-10-CM

## 2024-01-10 DIAGNOSIS — I10 Essential (primary) hypertension: Secondary | ICD-10-CM | POA: Diagnosis not present

## 2024-01-10 DIAGNOSIS — Z8759 Personal history of other complications of pregnancy, childbirth and the puerperium: Secondary | ICD-10-CM

## 2024-01-10 DIAGNOSIS — L905 Scar conditions and fibrosis of skin: Secondary | ICD-10-CM

## 2024-01-10 DIAGNOSIS — R87610 Atypical squamous cells of undetermined significance on cytologic smear of cervix (ASC-US): Secondary | ICD-10-CM

## 2024-01-10 DIAGNOSIS — Z86711 Personal history of pulmonary embolism: Secondary | ICD-10-CM | POA: Insufficient documentation

## 2024-01-10 DIAGNOSIS — L910 Hypertrophic scar: Secondary | ICD-10-CM | POA: Insufficient documentation

## 2024-01-10 DIAGNOSIS — Z9889 Other specified postprocedural states: Secondary | ICD-10-CM

## 2024-01-10 DIAGNOSIS — Z01818 Encounter for other preprocedural examination: Secondary | ICD-10-CM

## 2024-01-10 HISTORY — PX: MASS EXCISION: SHX2000

## 2024-01-10 HISTORY — PX: SCAR REVISION: SHX5285

## 2024-01-10 LAB — POCT PREGNANCY, URINE: Preg Test, Ur: NEGATIVE

## 2024-01-10 SURGERY — EXCISION MASS
Anesthesia: General | Site: Breast | Laterality: Right

## 2024-01-10 MED ORDER — FENTANYL CITRATE (PF) 100 MCG/2ML IJ SOLN
INTRAMUSCULAR | Status: AC
  Filled 2024-01-10: qty 2

## 2024-01-10 MED ORDER — MIDAZOLAM HCL 2 MG/2ML IJ SOLN
INTRAMUSCULAR | Status: AC
  Filled 2024-01-10: qty 2

## 2024-01-10 MED ORDER — LIDOCAINE 2% (20 MG/ML) 5 ML SYRINGE
INTRAMUSCULAR | Status: DC | PRN
  Administered 2024-01-10: 100 mg via INTRAVENOUS

## 2024-01-10 MED ORDER — ONDANSETRON HCL 4 MG/2ML IJ SOLN
INTRAMUSCULAR | Status: DC | PRN
  Administered 2024-01-10: 4 mg via INTRAVENOUS

## 2024-01-10 MED ORDER — DEXMEDETOMIDINE HCL IN NACL 80 MCG/20ML IV SOLN
INTRAVENOUS | Status: DC | PRN
  Administered 2024-01-10: 12 ug via INTRAVENOUS

## 2024-01-10 MED ORDER — CEFAZOLIN SODIUM-DEXTROSE 2-3 GM-%(50ML) IV SOLR
INTRAVENOUS | Status: DC | PRN
  Administered 2024-01-10: 2 g via INTRAVENOUS

## 2024-01-10 MED ORDER — OXYCODONE HCL 5 MG PO TABS
5.0000 mg | ORAL_TABLET | Freq: Once | ORAL | Status: AC | PRN
  Administered 2024-01-10: 5 mg via ORAL

## 2024-01-10 MED ORDER — OXYCODONE HCL 5 MG/5ML PO SOLN: 5.0000 mg | Freq: Once | ORAL | Status: AC | PRN

## 2024-01-10 MED ORDER — SCOPOLAMINE 1 MG/3DAYS TD PT72
MEDICATED_PATCH | TRANSDERMAL | Status: DC | PRN
  Administered 2024-01-10: 1 via TRANSDERMAL

## 2024-01-10 MED ORDER — BUPIVACAINE-EPINEPHRINE 0.25% -1:200000 IJ SOLN
INTRAMUSCULAR | Status: DC | PRN
  Administered 2024-01-10: 20 mL

## 2024-01-10 MED ORDER — PROPOFOL 10 MG/ML IV BOLUS
INTRAVENOUS | Status: DC | PRN
  Administered 2024-01-10: 200 ug via INTRAVENOUS
  Administered 2024-01-10 (×2): 30 ug via INTRAVENOUS

## 2024-01-10 MED ORDER — 0.9 % SODIUM CHLORIDE (POUR BTL) OPTIME
TOPICAL | Status: DC | PRN
  Administered 2024-01-10: 500 mL

## 2024-01-10 MED ORDER — HYDROMORPHONE HCL 1 MG/ML IJ SOLN: 0.2500 mg | INTRAMUSCULAR | Status: DC | PRN

## 2024-01-10 MED ORDER — HYDROMORPHONE HCL 1 MG/ML IJ SOLN
INTRAMUSCULAR | Status: AC
  Filled 2024-01-10: qty 0.5

## 2024-01-10 MED ORDER — ACETAMINOPHEN 10 MG/ML IV SOLN: 1000.0000 mg | Freq: Once | INTRAVENOUS | Status: DC | PRN

## 2024-01-10 MED ORDER — CHLORHEXIDINE GLUCONATE CLOTH 2 % EX PADS: 6.0000 | MEDICATED_PAD | Freq: Once | CUTANEOUS | Status: DC

## 2024-01-10 MED ORDER — OXYCODONE HCL 5 MG PO TABS
ORAL_TABLET | ORAL | Status: AC
  Filled 2024-01-10: qty 1

## 2024-01-10 MED ORDER — DEXAMETHASONE SODIUM PHOSPHATE 10 MG/ML IJ SOLN
INTRAMUSCULAR | Status: DC | PRN
  Administered 2024-01-10: 10 mg via INTRAVENOUS

## 2024-01-10 MED ORDER — LACTATED RINGERS IV SOLN: INTRAVENOUS | Status: DC

## 2024-01-10 MED ORDER — AMISULPRIDE (ANTIEMETIC) 5 MG/2ML IV SOLN: 10.0000 mg | Freq: Once | INTRAVENOUS | Status: DC | PRN

## 2024-01-10 MED ORDER — CEFAZOLIN SODIUM-DEXTROSE 2-4 GM/100ML-% IV SOLN
INTRAVENOUS | Status: AC
  Filled 2024-01-10: qty 100

## 2024-01-10 MED ORDER — ACETAMINOPHEN 10 MG/ML IV SOLN
INTRAVENOUS | Status: DC | PRN
  Administered 2024-01-10: 1000 mg via INTRAVENOUS

## 2024-01-10 MED ORDER — MIDAZOLAM HCL 2 MG/2ML IJ SOLN
INTRAMUSCULAR | Status: DC | PRN
  Administered 2024-01-10: 2 mg via INTRAVENOUS

## 2024-01-10 MED ORDER — HYDROMORPHONE HCL 1 MG/ML IJ SOLN
INTRAMUSCULAR | Status: DC | PRN
  Administered 2024-01-10: .5 mg via INTRAVENOUS

## 2024-01-10 MED ORDER — CEFAZOLIN SODIUM-DEXTROSE 2-4 GM/100ML-% IV SOLN: 2.0000 g | INTRAVENOUS | Status: DC

## 2024-01-10 MED ORDER — FENTANYL CITRATE (PF) 100 MCG/2ML IJ SOLN
INTRAMUSCULAR | Status: DC | PRN
  Administered 2024-01-10 (×5): 50 ug via INTRAVENOUS

## 2024-01-10 MED ORDER — ONDANSETRON HCL 4 MG/2ML IJ SOLN: 4.0000 mg | Freq: Once | INTRAMUSCULAR | Status: DC | PRN

## 2024-01-10 SURGICAL SUPPLY — 100 items
BAND RUBBER #18 3X1/16 STRL (MISCELLANEOUS) IMPLANT
BENZOIN TINCTURE PRP APPL 2/3 (GAUZE/BANDAGES/DRESSINGS) IMPLANT
BINDER BREAST XXLRG (GAUZE/BANDAGES/DRESSINGS) IMPLANT
BLADE CLIPPER SURG (BLADE) IMPLANT
BLADE SURG 10 STRL SS (BLADE) IMPLANT
BLADE SURG 15 STRL LF DISP TIS (BLADE) ×2 IMPLANT
BNDG ELASTIC 2INX 5YD STR LF (GAUZE/BANDAGES/DRESSINGS) IMPLANT
BNDG GAUZE DERMACEA FLUFF 4 (GAUZE/BANDAGES/DRESSINGS) IMPLANT
CANISTER SUCT 1200ML W/VALVE (MISCELLANEOUS) IMPLANT
CHLORAPREP W/TINT 26 (MISCELLANEOUS) ×2 IMPLANT
CLSR STERI-STRIP ANTIMIC 1/2X4 (GAUZE/BANDAGES/DRESSINGS) IMPLANT
CORD BIPOLAR FORCEPS 12FT (ELECTRODE) IMPLANT
COVER BACK TABLE 60X90IN (DRAPES) ×2 IMPLANT
COVER MAYO STAND STRL (DRAPES) ×2 IMPLANT
DERMABOND ADVANCED .7 DNX12 (GAUZE/BANDAGES/DRESSINGS) IMPLANT
DRAIN WOUND RND W/TROCAR (DRAIN) IMPLANT
DRAPE LAPAROTOMY 100X72 PEDS (DRAPES) IMPLANT
DRAPE LAPAROTOMY T 102X78X121 (DRAPES) IMPLANT
DRAPE U-SHAPE 76X120 STRL (DRAPES) IMPLANT
DRAPE UTILITY XL STRL (DRAPES) ×2 IMPLANT
DRESSING MEPILEX FLEX 4X4 (GAUZE/BANDAGES/DRESSINGS) IMPLANT
DRSG ADAPTIC 3X8 NADH LF (GAUZE/BANDAGES/DRESSINGS) IMPLANT
DRSG EMULSION OIL 3X3 NADH (GAUZE/BANDAGES/DRESSINGS) IMPLANT
DRSG MEPILEX FLEX 4X4 (GAUZE/BANDAGES/DRESSINGS) ×2
DRSG MEPILEX POST OP 4X8 (GAUZE/BANDAGES/DRESSINGS) IMPLANT
DRSG TELFA 3X8 NADH STRL (GAUZE/BANDAGES/DRESSINGS) IMPLANT
ELECT COATED BLADE 2.86 ST (ELECTRODE) IMPLANT
ELECT NDL BLADE 2-5/6 (NEEDLE) ×2 IMPLANT
ELECT NEEDLE BLADE 2-5/6 (NEEDLE) ×2
ELECT REM PT RETURN 9FT ADLT (ELECTROSURGICAL)
ELECT REM PT RETURN 9FT PED (ELECTROSURGICAL)
ELECTRODE REM PT RETRN 9FT PED (ELECTROSURGICAL) IMPLANT
ELECTRODE REM PT RTRN 9FT ADLT (ELECTROSURGICAL) IMPLANT
EVACUATOR SILICONE 100CC (DRAIN) IMPLANT
GAUZE PAD ABD 8X10 STRL (GAUZE/BANDAGES/DRESSINGS) IMPLANT
GAUZE SPONGE 2X2 STRL 8-PLY (GAUZE/BANDAGES/DRESSINGS) IMPLANT
GAUZE SPONGE 4X4 12PLY STRL LF (GAUZE/BANDAGES/DRESSINGS) IMPLANT
GAUZE STRETCH 2X75IN STRL (MISCELLANEOUS) IMPLANT
GAUZE XEROFORM 1X8 LF (GAUZE/BANDAGES/DRESSINGS) IMPLANT
GAUZE XEROFORM 5X9 LF (GAUZE/BANDAGES/DRESSINGS) IMPLANT
GLOVE BIO SURGEON STRL SZ 6.5 (GLOVE) IMPLANT
GLOVE BIO SURGEON STRL SZ7.5 (GLOVE) IMPLANT
GLOVE BIO SURGEON STRL SZ8 (GLOVE) ×2 IMPLANT
GLOVE BIOGEL M STRL SZ7.5 (GLOVE) ×2 IMPLANT
GLOVE BIOGEL PI IND STRL 7.0 (GLOVE) IMPLANT
GLOVE BIOGEL PI IND STRL 7.5 (GLOVE) ×2 IMPLANT
GLOVE BIOGEL PI IND STRL 8 (GLOVE) IMPLANT
GOWN STRL REUS W/ TWL LRG LVL3 (GOWN DISPOSABLE) ×4 IMPLANT
GOWN STRL REUS W/ TWL XL LVL3 (GOWN DISPOSABLE) IMPLANT
GOWN STRL REUS W/TWL XL LVL3 (GOWN DISPOSABLE) ×2 IMPLANT
HIBICLENS CHG 4% 4OZ BTL (MISCELLANEOUS) ×2 IMPLANT
MARKER SKIN DUAL TIP RULER LAB (MISCELLANEOUS) IMPLANT
NDL FILTER BLUNT 18X1 1/2 (NEEDLE) IMPLANT
NDL HYPO 30GX1 BEV (NEEDLE) IMPLANT
NDL PRECISIONGLIDE 27X1.5 (NEEDLE) ×2 IMPLANT
NDL SAFETY ECLIPSE 18X1.5 (NEEDLE) IMPLANT
NDL SPNL 18GX3.5 QUINCKE PK (NEEDLE) IMPLANT
NEEDLE FILTER BLUNT 18X1 1/2 (NEEDLE)
NEEDLE HYPO 30GX1 BEV (NEEDLE)
NEEDLE PRECISIONGLIDE 27X1.5 (NEEDLE) ×2
NEEDLE SPNL 18GX3.5 QUINCKE PK (NEEDLE)
NS IRRIG 1000ML POUR BTL (IV SOLUTION) IMPLANT
PACK BASIN DAY SURGERY FS (CUSTOM PROCEDURE TRAY) ×2 IMPLANT
PACK UNIVERSAL I (CUSTOM PROCEDURE TRAY) IMPLANT
PENCIL SMOKE EVACUATOR (MISCELLANEOUS) ×2 IMPLANT
SHEET MEDIUM DRAPE 40X70 STRL (DRAPES) IMPLANT
SLEEVE SCD COMPRESS KNEE MED (STOCKING) IMPLANT
SPONGE T-LAP 18X18 ~~LOC~~+RFID (SPONGE) IMPLANT
STAPLER SKIN PROX WIDE 3.9 (STAPLE) ×2 IMPLANT
STRIP CLOSURE SKIN 1/2X4 (GAUZE/BANDAGES/DRESSINGS) IMPLANT
STRIP SUTURE WOUND CLOSURE 1/2 (MISCELLANEOUS) IMPLANT
SUCTION TUBE FRAZIER 10FR DISP (SUCTIONS) IMPLANT
SUT CHROMIC 4 0 P 3 18 (SUTURE) IMPLANT
SUT CHROMIC 5 0 P 3 (SUTURE) IMPLANT
SUT ETHILON 4 0 P 3 18 (SUTURE) IMPLANT
SUT ETHILON 4 0 PS 2 18 (SUTURE) IMPLANT
SUT MNCRL 6-0 UNDY P1 1X18 (SUTURE) IMPLANT
SUT MNCRL AB 3-0 PS2 18 (SUTURE) IMPLANT
SUT MNCRL AB 3-0 PS2 27 (SUTURE) IMPLANT
SUT MNCRL AB 4-0 PS2 18 (SUTURE) IMPLANT
SUT MON AB 5-0 P3 18 (SUTURE) IMPLANT
SUT NYLON ETHILON 5-0 P-3 1X18 (SUTURE) IMPLANT
SUT PDS 3-0 CT2 (SUTURE)
SUT PDS II 3-0 CT2 27 ABS (SUTURE) IMPLANT
SUT PROLENE 4 0 PS 2 18 (SUTURE) IMPLANT
SUT PROLENE 5 0 P 3 (SUTURE) IMPLANT
SUT STRATA 3-0 60 PS-1 (SUTURE) IMPLANT
SUT VIC AB 3-0 SH 27X BRD (SUTURE) IMPLANT
SUT VIC AB 4-0 PS2 18 (SUTURE) IMPLANT
SUT VIC AB 5-0 P-3 18X BRD (SUTURE) IMPLANT
SUT VICRYL RAPIDE 4-0 (SUTURE) IMPLANT
SWAB COLLECTION DEVICE MRSA (MISCELLANEOUS) IMPLANT
SWAB CULTURE ESWAB REG 1ML (MISCELLANEOUS) IMPLANT
SYR 50ML LL SCALE MARK (SYRINGE) IMPLANT
SYR BULB EAR ULCER 3OZ GRN STR (SYRINGE) IMPLANT
SYR CONTROL 10ML LL (SYRINGE) ×2 IMPLANT
TOWEL GREEN STERILE FF (TOWEL DISPOSABLE) ×2 IMPLANT
TRAY DSU PREP LF (CUSTOM PROCEDURE TRAY) IMPLANT
TUBE CONNECTING 20X1/4 (TUBING) IMPLANT
YANKAUER SUCT BULB TIP NO VENT (SUCTIONS) IMPLANT

## 2024-01-10 NOTE — Anesthesia Procedure Notes (Signed)
 Procedure Name: LMA Insertion Date/Time: 01/10/2024 7:41 AM  Performed by: Leotha Andrez DEL, CRNAPre-anesthesia Checklist: Patient identified, Emergency Drugs available, Suction available and Patient being monitored Patient Re-evaluated:Patient Re-evaluated prior to induction Oxygen Delivery Method: Circle System Utilized Preoxygenation: Pre-oxygenation with 100% oxygen Induction Type: IV induction Ventilation: Mask ventilation without difficulty LMA: LMA inserted LMA Size: 4.0 Number of attempts: 1 Airway Equipment and Method: Bite block Placement Confirmation: positive ETCO2 Tube secured with: Tape Dental Injury: Teeth and Oropharynx as per pre-operative assessment

## 2024-01-10 NOTE — Interval H&P Note (Signed)
 History and Physical Interval Note: No change in exam or indication for surgery All questions answered. Right areola and left chest marked with her concurrence Will proceed with right mass excision and left scar revision at her request  01/10/2024 6:58 AM  Mata Dyment  has presented today for surgery, with the diagnosis of Mass of right breast, unspecified quadrant.  The various methods of treatment have been discussed with the patient and family. After consideration of risks, benefits and other options for treatment, the patient has consented to  Procedure(s): excision of right retro areolar mass (Right) revision of left inframammary/axillary scar (Right) as a surgical intervention.  The patient's history has been reviewed, patient examined, no change in status, stable for surgery.  I have reviewed the patient's chart and labs.  Questions were answered to the patient's satisfaction.     Leonce KATHEE [place]

## 2024-01-10 NOTE — Anesthesia Postprocedure Evaluation (Signed)
 Anesthesia Post Note  Patient: Alexis Richardson  Procedure(s) Performed: excision of right retro areolar mass (Right: Breast) revision of left inframammary/axillary scar (Right: Axilla)     Patient location during evaluation: PACU Anesthesia Type: General Level of consciousness: awake and alert Pain management: pain level controlled Vital Signs Assessment: post-procedure vital signs reviewed and stable Respiratory status: spontaneous breathing, nonlabored ventilation, respiratory function stable and patient connected to nasal cannula oxygen Cardiovascular status: blood pressure returned to baseline and stable Postop Assessment: no apparent nausea or vomiting Anesthetic complications: no   No notable events documented.  Last Vitals:  Vitals:   01/10/24 0923 01/10/24 0940  BP:  (!) 131/92  Pulse:  73  Resp:  16  Temp:  (!) 36.2 C  SpO2: 95% 94%    Last Pain:  Vitals:   01/10/24 0940  TempSrc:   PainSc: 5                  Garnette DELENA Gab

## 2024-01-10 NOTE — Transfer of Care (Signed)
 Immediate Anesthesia Transfer of Care Note  Patient: Alexis Richardson  Procedure(s) Performed: excision of right retro areolar mass (Right: Breast) revision of left inframammary/axillary scar (Right: Axilla)  Patient Location: PACU  Anesthesia Type:General  Level of Consciousness: drowsy and patient cooperative  Airway & Oxygen Therapy: Patient Spontanous Breathing and Patient connected to face mask oxygen  Post-op Assessment: Report given to RN and Post -op Vital signs reviewed and stable  Post vital signs: Reviewed and stable  Last Vitals:  Vitals Value Taken Time  BP 110/69   Temp    Pulse 72 01/10/24 0907  Resp 19 01/10/24 0907  SpO2 93 % 01/10/24 0907  Vitals shown include unfiled device data.  Last Pain:  Vitals:   01/10/24 0627  TempSrc: Temporal  PainSc: 0-No pain      Patients Stated Pain Goal: 3 (01/10/24 9372)  Complications: No notable events documented.

## 2024-01-10 NOTE — Discharge Instructions (Addendum)
 Activity: Avoid strenuous activity.  No lifting, pushing, or pulling greater than 15 pounds.   Diet: No restrictions.  Try to optimize nutrition with plenty of proteins, fruits, and vegetables to improve healing.   Follow-Up: As scheduled.  Things to watch for:  Call the office if you experience fever, chills, intractable vomiting, or significant bleeding.  Mild wound drainage is common after breast reduction surgery and should not be cause for alarm.   If your bandage along left lateral breast comes off, that is fine. Simply cover the area with an ABD pad or maxi pad.  Medications: Please remember your lovenox  injections beginning today, once daily x 5 days  You may have Tylenol  again after 2:20pm today, if needed.   Post Anesthesia Home Care Instructions  Activity: Get plenty of rest for the remainder of the day. A responsible individual must stay with you for 24 hours following the procedure.  For the next 24 hours, DO NOT: -Drive a car -Advertising copywriter -Drink alcoholic beverages -Take any medication unless instructed by your physician -Make any legal decisions or sign important papers.  Meals: Start with liquid foods such as gelatin or soup. Progress to regular foods as tolerated. Avoid greasy, spicy, heavy foods. If nausea and/or vomiting occur, drink only clear liquids until the nausea and/or vomiting subsides. Call your physician if vomiting continues.  Special Instructions/Symptoms: Your throat may feel dry or sore from the anesthesia or the breathing tube placed in your throat during surgery. If this causes discomfort, gargle with warm salt water. The discomfort should disappear within 24 hours.  If you had a scopolamine  patch placed behind your ear for the management of post- operative nausea and/or vomiting:  1. The medication in the patch is effective for 72 hours, after which it should be removed.  Wrap patch in a tissue and discard in the trash. Wash hands thoroughly  with soap and water. 2. You may remove the patch earlier than 72 hours if you experience unpleasant side effects which may include dry mouth, dizziness or visual disturbances. 3. Avoid touching the patch. Wash your hands with soap and water after contact with the patch.

## 2024-01-10 NOTE — Op Note (Signed)
 DATE OF OPERATION: 01/10/2024  LOCATION: [facility] operating Room  PREOPERATIVE DIAGNOSIS: Right retroareolar mass, left axillary scar  POSTOPERATIVE DIAGNOSIS: Same  PROCEDURE: Excision of right retroareolar areolar mass, left scar revision  SURGEON: Marinell Birmingham, MD  ASSISTANT: Honora Seip  EBL: 10 cc  CONDITION: Stable  COMPLICATIONS: None  INDICATION: The patient, Alexis Richardson, is a 23 y.o. female born on 2001-01-18, is here for treatment of a 1 cm retroareolar mass in the right breast and a widened left axillary scar after breast reduction.   PROCEDURE DETAILS:  The patient was seen prior to surgery and marked.  The IV antibiotics were given. The patient was taken to the operating room and given a general anesthetic. A standard time out was performed and all information was confirmed by those in the room. SCDs were placed.   The entire chest was prepped and draped in usual sterile manner.  The right retroareolar mass was addressed first.  An incision was made through the previous periareolar scar and the mass was identified and dissected free of the surrounding tissue.  The mass was densely adherent to the areolar skin and a small portion of skin was excised with the mass.  The wound was infiltrated with quarter percent Marcaine  with epinephrine  and hemostasis achieved with electrocautery.  The wound was then closed with 3-0 and 4-0 Monocryl sutures.  Attention was turned to the left axillary scar.  The scar borders were incised and the electrocautery was used to resect the widened thinned skin.  A portion of the fat underneath the scar was removed to allow for tension-free closure.  The wound was infiltrated with quarter percent Marcaine  with epinephrine  and meticulous hemostasis achieved with the electrocautery.  The deep tissues were closed with interrupted 3-0 Monocryl sutures the dermis was closed with interrupted 3-0 Monocryl sutures and the skin was closed with a running 4-0  Monocryl subcuticular stitch.  The entire length of the incision was 14 cm.  All incisions were sealed with Dermabond and a sterile dressing was placed on the axillary scar.  The patient was awakened from anesthesia without incident transferred to the recovery room in good condition.  All instrument needle and sponge counts were reported as correct and no complications were noted during the procedure. The patient was allowed to wake up and taken to recovery room in stable condition at the end of the case. The family was notified at the end of the case.   The advanced practice practitioner (APP) assisted throughout the case.  The APP was essential in retraction and counter traction when needed to make the case progress smoothly.  This retraction and assistance made it possible to see the tissue plans for the procedure.  The assistance was needed for blood control, tissue re-approximation and assisted with closure of the incision site.

## 2024-01-11 ENCOUNTER — Encounter (HOSPITAL_BASED_OUTPATIENT_CLINIC_OR_DEPARTMENT_OTHER): Payer: Self-pay | Admitting: Plastic Surgery

## 2024-01-11 LAB — SURGICAL PATHOLOGY

## 2024-01-12 ENCOUNTER — Encounter: Payer: Self-pay | Admitting: Physician Assistant

## 2024-01-12 ENCOUNTER — Ambulatory Visit (INDEPENDENT_AMBULATORY_CARE_PROVIDER_SITE_OTHER): Payer: Medicaid Other | Admitting: Physician Assistant

## 2024-01-12 ENCOUNTER — Telehealth: Payer: Self-pay | Admitting: Physician Assistant

## 2024-01-12 DIAGNOSIS — Z9889 Other specified postprocedural states: Secondary | ICD-10-CM

## 2024-01-12 NOTE — Telephone Encounter (Signed)
 mistake

## 2024-01-12 NOTE — Progress Notes (Signed)
 Patient is a pleasant 23 year old female with history of bilateral breast reduction now s/p excision of right areolar mass and left lateral breast scar revision performed 01/10/2024 by Dr. Waddell who joins via telephone for postoperative day 2 check-in.  Reviewed operative report and the excision sites were closed with absorbable Monocryl sutures and Dermabond.  Today, patient is doing well.  Pain is well-controlled, only taking Tylenol  today.  She had a couple of questions about her overlying bandages which we discussed.  She does have a history of tape sensitivity, but provided reassurance that we used silicone bordered dressings that rarely cause dermatitis.  Mild incisional tingling/itching could simply reflect normal healing process, but if it intensifies or worsens over the weekend she can remove the bandage and cover with ABD pads or maxipads.  She can also take antihistamines to help.  She is ambulatory, tolerating p.o. intake without difficulty.  Voiding urine, no BM yet.  She may take a laxative.  She denies any leg swelling, chest pain, difficulty breathing, or other concerns.  She has already reviewed the pathology that is negative for malignancy.  All questions answered.  Follow-up next week for her scheduled an office appointment.  She can certainly call the office should she have any questions or concerns in interim.

## 2024-01-16 ENCOUNTER — Encounter: Payer: Self-pay | Admitting: Physician Assistant

## 2024-01-16 ENCOUNTER — Ambulatory Visit (INDEPENDENT_AMBULATORY_CARE_PROVIDER_SITE_OTHER): Payer: Medicaid Other | Admitting: Physician Assistant

## 2024-01-16 VITALS — BP 133/69 | HR 80 | Ht 64.0 in | Wt 235.6 lb

## 2024-01-16 DIAGNOSIS — Z9889 Other specified postprocedural states: Secondary | ICD-10-CM

## 2024-01-16 NOTE — Progress Notes (Signed)
Patient is a pleasant 23 year old female with history of bilateral breast reduction now s/p excision of right areolar mass and left lateral breast scar revision performed 01/10/2024 by Dr. Ladona Ridgel who presents to clinic for postoperative follow-up.  Today, patient is doing well.  She states that she is being particularly cautious with her postoperative activity restrictions given that caring for her child independently after her reduction surgery may have contributed to the left inframammary incision scar widening and hypertrophy.  She does not want any recurrence.  She denies any drainage.  Bandages remained in place.  She denies any significant pain, chest pain, difficulty breathing, leg swelling, fevers, or other concerns.  She is eating and drinking well, voiding urine and BM.  Ambulatory.  Pathology was benign.    Exam is entirely benign.  Incisions CDI throughout.  Overlying Dermabond noted.  No underlying fluid collections appreciated.  No incisional wounds or dehiscence.  See pictures.  Recommend continued activity modifications and compressive garments.  She can begin applying Vaseline to her right NAC, but would recommend that she hold off at least another week before applying Vaseline to left breast scar revision.  Follow-up as scheduled, sooner if needed.  Picture(s) obtained of the patient and placed in the chart were with the patient's or guardian's permission.

## 2024-01-23 ENCOUNTER — Telehealth: Payer: Self-pay | Admitting: Plastic Surgery

## 2024-01-23 NOTE — Telephone Encounter (Signed)
provider not in office lvmail 01-23-24

## 2024-01-25 ENCOUNTER — Encounter: Payer: Medicaid Other | Admitting: Plastic Surgery

## 2024-01-26 NOTE — Progress Notes (Signed)
 Patient is a pleasant 23 year old female with history of bilateral breast reduction now s/p excision of right areolar mass and left lateral breast scar revision performed 01/10/2024 by Dr. Ladona Ridgel who presents to clinic for postoperative follow-up.   She was last seen here in clinic on 01/16/2024.  At that time, exam was entirely benign.  She may begin applying Vaseline to her incisions.    Today, patient is doing well.  She tells me that she recently noticed some bleeding from her right NAC.  Otherwise, she feels as though her scar revision sites have been improving.  She has been applying Vaseline, as directed.  Reports that a lot of the Dermabond has sloughed off.  Aside from the bleeding, no other concerns or complaints.  Denies any leg swelling, chest pain, fevers, or other issues.  She is eager to begin holding her 50-month-old again.  On exam, the left lateral breast scar revision site has healed quite nicely.  Thin scarring, no significant residual Dermabond.  No incisional wounds or dehiscence.  No evidence of widening.  The right NAC scar tissue excision site has also healed nicely along areolar margin, but there is a small wound inside the areola.  Patient understands that during the excision, there was an incidental incision inside the areola that occurred during excision.  This is where she currently has mild bleeding/wound.  Recommending Vaseline followed by bordered Mepilex dressing.  Suspect that it will heal without too much difficulty.  The remainder of her incisions appear to be healing nicely.  Scattered sutures removed without complication.  She can begin applying silicone scar gel twice daily x 3 months to the left lateral breast.  Will have her follow-up in 2 weeks for reevaluation of the right NAC.  Plan to obtain photos at that time

## 2024-01-27 ENCOUNTER — Encounter: Payer: Self-pay | Admitting: Physician Assistant

## 2024-01-27 ENCOUNTER — Ambulatory Visit (INDEPENDENT_AMBULATORY_CARE_PROVIDER_SITE_OTHER): Payer: Medicaid Other | Admitting: Physician Assistant

## 2024-01-27 VITALS — BP 137/86 | HR 88

## 2024-01-27 DIAGNOSIS — Z9889 Other specified postprocedural states: Secondary | ICD-10-CM

## 2024-02-01 ENCOUNTER — Encounter: Payer: Medicaid Other | Admitting: Physician Assistant

## 2024-02-09 NOTE — Progress Notes (Signed)
 Patient is a pleasant 23 year old female with history of bilateral breast reduction now s/p excision of right areolar mass and left lateral breast scar revision performed 01/10/2024 by Dr. Ladona Ridgel who presents to clinic for postoperative follow-up.   She was last seen here in clinic on 01/27/2024.  At that time, left lateral breast scar revision had healed nicely.  Small wound inside the right areola, but otherwise exam benign.  Recommended Vaseline to the wound followed by bordered Mepilex dressing.  Follow-up in 2 weeks for reevaluation.  Today, she is doing well.  She reports that the right areolar wound healed nicely and without complication.  The left lateral breast scar revision site feels a bit tight and uncomfortable, but otherwise she is healing appropriately and without concerns.  On exam, the right areolar scar revision site appears to be well-healed.  No persistent wounds noted.  Left lateral breast scar revision site also appears to have healed nicely.  Mild firmness underneath the excision site, possible early scar tissue formation.  There is no surrounding erythema or subcutaneous fluid collections.  Recommend that patient begin silicone scar gel twice daily x 3 months to the areola in addition to continued scar treatment for the left lateral breast scar revision.  She states that she has similar scar widening on the lateral aspect of right inframammary incision, but that it has gotten better in recent months.  Plan is for her to follow-up with Dr. Ladona Ridgel in 3 to 6 months if it is persistent or worsening for consideration of additional scar revision.  She will call the office should she have any questions or concerns in interim.  Picture(s) obtained of the patient and placed in the chart were with the patient's or guardian's permission.

## 2024-02-10 ENCOUNTER — Ambulatory Visit: Payer: Medicaid Other | Admitting: Physician Assistant

## 2024-02-10 VITALS — BP 135/92 | HR 98 | Temp 98.3°F

## 2024-02-10 DIAGNOSIS — Z9889 Other specified postprocedural states: Secondary | ICD-10-CM

## 2024-12-04 ENCOUNTER — Inpatient Hospital Stay (HOSPITAL_COMMUNITY)
Admission: AD | Admit: 2024-12-04 | Discharge: 2024-12-04 | Disposition: A | Discharge status: Home / Self Care | Attending: Obstetrics and Gynecology | Admitting: Obstetrics and Gynecology

## 2024-12-04 ENCOUNTER — Other Ambulatory Visit: Payer: Self-pay

## 2024-12-04 ENCOUNTER — Encounter (HOSPITAL_COMMUNITY): Payer: Self-pay | Admitting: Obstetrics and Gynecology

## 2024-12-04 ENCOUNTER — Inpatient Hospital Stay (HOSPITAL_COMMUNITY)

## 2024-12-04 DIAGNOSIS — O3680X0 Pregnancy with inconclusive fetal viability, not applicable or unspecified: Secondary | ICD-10-CM

## 2024-12-04 DIAGNOSIS — N939 Abnormal uterine and vaginal bleeding, unspecified: Secondary | ICD-10-CM

## 2024-12-04 DIAGNOSIS — Z3A01 Less than 8 weeks gestation of pregnancy: Secondary | ICD-10-CM | POA: Insufficient documentation

## 2024-12-04 DIAGNOSIS — Z3A00 Weeks of gestation of pregnancy not specified: Secondary | ICD-10-CM

## 2024-12-04 DIAGNOSIS — O4691 Antepartum hemorrhage, unspecified, first trimester: Secondary | ICD-10-CM | POA: Diagnosis present

## 2024-12-04 DIAGNOSIS — R7989 Other specified abnormal findings of blood chemistry: Secondary | ICD-10-CM | POA: Diagnosis not present

## 2024-12-04 LAB — WET PREP, GENITAL
Clue Cells Wet Prep HPF POC: NONE SEEN
Sperm: NONE SEEN
Trich, Wet Prep: NONE SEEN
WBC, Wet Prep HPF POC: >=10 — AB
Yeast Wet Prep HPF POC: NONE SEEN

## 2024-12-04 LAB — URINALYSIS, ROUTINE W REFLEX MICROSCOPIC
Bilirubin Urine: NEGATIVE
Glucose, UA: NEGATIVE mg/dL
Hgb urine dipstick: NEGATIVE
Ketones, ur: NEGATIVE mg/dL
Leukocytes,Ua: NEGATIVE
Nitrite: NEGATIVE
Protein, ur: NEGATIVE mg/dL
Specific Gravity, Urine: 1.021 (ref 1.005–1.030)
pH: 7 (ref 5.0–8.0)

## 2024-12-04 LAB — CBC
HCT: 38.5 % (ref 36.0–46.0)
Hemoglobin: 12.8 g/dL (ref 12.0–15.0)
MCH: 28.6 pg (ref 26.0–34.0)
MCHC: 33.2 g/dL (ref 30.0–36.0)
MCV: 85.9 fL (ref 80.0–100.0)
Platelets: 257 K/uL (ref 150–400)
RBC: 4.48 MIL/uL (ref 3.87–5.11)
RDW: 14.9 % (ref 11.5–15.5)
WBC: 6.5 K/uL (ref 4.0–10.5)
nRBC: 0 % (ref 0.0–0.2)

## 2024-12-04 LAB — POCT PREGNANCY, URINE: Preg Test, Ur: POSITIVE — AB

## 2024-12-04 LAB — HCG, QUANTITATIVE, PREGNANCY: hCG, Beta Chain, Quant, S: 364 m[IU]/mL — ABNORMAL HIGH

## 2024-12-04 NOTE — MAU Note (Signed)
 Alexis Richardson is a 23 y.o. at Unknown here in MAU reporting: she's having abdominal cramping and spotting that began 3 days ago, states not currently bleeding.  Reports had a +HPT.  LMP: 10/31/2024 Onset of complaint: 3 days ago Pain score: 5 Vitals:   12/04/24 1049  BP: (!) 129/97  Pulse: 77  Resp: 18  Temp: 98.1 F (36.7 C)  SpO2: 100%     FHT: NA  Lab orders placed from triage: UPT & UA

## 2024-12-04 NOTE — MAU Provider Note (Signed)
 "  CC: Vaginal Bleeding  S/ HPI  Ms. Alexis Richardson is a 23 y.o. [id number] patient who presents to MAU today with complaint of reports that she has had a positive home pregnancy test and that she has been having abdominal cramping and spotting that began 3 days ago.  She states she is not currently bleeding and her last menstrual period was 10/31/2024.  Patient denies any vaginal discharge, itching, burning and offers no urinary complaints at this time.  The ROS is negative unless otherwise noted in HPI above  O BP (!) 129/97 (BP Location: Right Arm)   Pulse 77   Temp 98.1 F (36.7 C) (Oral)   Resp 18   Ht 5' 3 (1.6 m)   Wt 98.7 kg   LMP 10/31/2024   SpO2 100%   BMI 38.55 kg/m  Physical Exam Vitals and nursing note reviewed.  Constitutional:      General: She is not in acute distress.    Appearance: Normal appearance. She is obese. She is not ill-appearing.  Cardiovascular:     Rate and Rhythm: Normal rate.  Pulmonary:     Effort: Pulmonary effort is normal.  Abdominal:     Palpations: Abdomen is soft.     Tenderness: There is no abdominal tenderness. There is no guarding or rebound.  Musculoskeletal:        General: Normal range of motion.     Cervical back: Normal range of motion.  Skin:    General: Skin is warm.  Neurological:     Mental Status: She is alert and oriented to person, place, and time.  Psychiatric:        Behavior: Behavior normal.     MDM  HIGH  Vaginal bleeding/ cramping  in early pregnancy  CBC: NM HCG Quant: 364 ABO: A Negative  OB Ultrasound (no visualized gestational sac, yolk sac or embryo at this time therefore it meets criteria for pregnancy of unknown anatomical location) plan to follow serial quant's and follow-up ultrasound in approximately 14 days based on hCG quant levels. A message was sent to the office for scheduling  Vaginal Swabs: Wet prep negative, GC pending at discharge UA: No evidence of UTI   Differential diagnosis  considered for 1st trimester vaginal bleeding includes but is not limited to: ectopic pregnancy, complete spontaneous abortion, incomplete abortion, missed abortion, threatened abortion, embryonic/fetal demise, cervical insufficiency, cervical or vaginal disorder    Orders Placed This Encounter  Procedures   Wet prep, genital    Standing Status:   Standing    Number of Occurrences:   1   US  OB LESS THAN 14 WEEKS WITH OB TRANSVAGINAL    Standing Status:   Standing    Number of Occurrences:   1    Symptom/Reason for Exam:   Vaginal bleeding [[id number]]    Symptom/Reason for Exam:   Abdominal cramping [[id number]]   Urinalysis, Routine w reflex microscopic -Urine, Clean Catch    Standing Status:   Standing    Number of Occurrences:   1    Specimen Source:   Urine, Clean Catch [76]   CBC    Standing Status:   Standing    Number of Occurrences:   1   hCG, quantitative, pregnancy    Standing Status:   Standing    Number of Occurrences:   1   Pregnancy, urine POC    Standing Status:   Standing    Number of Occurrences:   1  Results for orders placed or performed during the hospital encounter of 12/04/24 (from the past 24 hours)  Pregnancy, urine POC     Status: Abnormal   Collection Time: 12/04/24 10:21 AM  Result Value Ref Range   Preg Test, Ur POSITIVE (A) NEGATIVE  Wet prep, genital     Status: Abnormal   Collection Time: 12/04/24 11:00 AM  Result Value Ref Range   Yeast Wet Prep HPF POC NONE SEEN NONE SEEN   Trich, Wet Prep NONE SEEN NONE SEEN   Clue Cells Wet Prep HPF POC NONE SEEN NONE SEEN   WBC, Wet Prep HPF POC >=10 (A) <10   Sperm NONE SEEN   Urinalysis, Routine w reflex microscopic -Urine, Clean Catch     Status: Abnormal   Collection Time: 12/04/24 11:16 AM  Result Value Ref Range   Color, Urine YELLOW YELLOW   APPearance HAZY (A) CLEAR   Specific Gravity, Urine 1.021 1.005 - 1.030   pH 7.0 5.0 - 8.0   Glucose, UA NEGATIVE NEGATIVE mg/dL   Hgb urine dipstick  NEGATIVE NEGATIVE   Bilirubin Urine NEGATIVE NEGATIVE   Ketones, ur NEGATIVE NEGATIVE mg/dL   Protein, ur NEGATIVE NEGATIVE mg/dL   Nitrite NEGATIVE NEGATIVE   Leukocytes,Ua NEGATIVE NEGATIVE  CBC     Status: None   Collection Time: 12/04/24 11:33 AM  Result Value Ref Range   WBC 6.5 4.0 - 10.5 K/uL   RBC 4.48 3.87 - 5.11 MIL/uL   Hemoglobin 12.8 12.0 - 15.0 g/dL   HCT 61.4 63.9 - 53.9 %   MCV 85.9 80.0 - 100.0 fL   MCH 28.6 26.0 - 34.0 pg   MCHC 33.2 30.0 - 36.0 g/dL   RDW 85.0 88.4 - 84.4 %   Platelets 257 150 - 400 K/uL   nRBC 0.0 0.0 - 0.2 %  hCG, quantitative, pregnancy     Status: Abnormal   Collection Time: 12/04/24 11:33 AM  Result Value Ref Range   hCG, Beta Chain, Quant, S 364 (H) <5 mIU/mL      Study Result  Narrative & Impression  CLINICAL DATA:  Vaginal bleeding for 3 days.   EXAM: OBSTETRIC <14 WK US  AND TRANSVAGINAL OB US    TECHNIQUE: Both transabdominal and transvaginal ultrasound examinations were performed for complete evaluation of the gestation as well as the maternal uterus, adnexal regions, and pelvic cul-de-sac. Transvaginal technique was performed to assess early pregnancy.   COMPARISON:  None Available.   FINDINGS: Intrauterine gestational sac: None   Yolk sac:  Not Visualized.   Embryo:  Not Visualized.   Cardiac Activity: Not Visualized.   Subchorionic hemorrhage:  None visualized.   Maternal uterus/adnexae: Ovaries are unremarkable. Small amount of free fluid is noted most likely is physiologic.   IMPRESSION: No intrauterine gestational sac, yolk sac, fetal pole, or cardiac activity visualized. Differential considerations include intrauterine gestation too early to be sonographically visualized, spontaneous abortion, or ectopic pregnancy. Consider follow-up ultrasound in 14 days and serial quantitative beta HCG follow-up.     Electronically Signed   By: Lynwood Landy Raddle M.D.   On: 12/04/2024 15:07       ASSESSMENT Medical screening exam complete  Elevated serum hCG Serum HCG at 364 Repeat beta-hCG level in 48 hours or 12/07/2024 Message sent to the office for scheduling  Pregnancy of unknown anatomic location Ultrasound performed with pregnancy of unknown location at this time likely due to early gestation.  Cannot rule out ectopic pregnancy at this time.  Vaginal spotting Precautions given to the patient regarding heavy vaginal bleeding in pregnancy and when to return to the MAU   PLAN Future Appointments  Date Time Provider Department Center  12/07/2024  8:20 AM [facility]    Discharge from MAU in stable condition  See AVS for full description of educational information and instructions provided to the patient at time of discharge   Warning signs for worsening condition that would warrant emergency follow-up discussed  Patient may return to MAU as needed   Littie Olam LABOR, NP 12/04/2024 3:21 PM   "

## 2024-12-04 NOTE — Discharge Instructions (Signed)
 You will need to have a repeat hCG level in 48 hours on Friday, 12/08/2023.  I have sent a message to one of our offices to schedule this for you.  Please contact them with any questions or concerns and return to the MAU if you have heavier bleeding or cramping.

## 2024-12-05 LAB — GC/CHLAMYDIA PROBE AMP (~~LOC~~) NOT AT ARMC
Chlamydia: NEGATIVE
Comment: NEGATIVE
Comment: NORMAL
Neisseria Gonorrhea: NEGATIVE

## 2024-12-07 ENCOUNTER — Encounter: Payer: Self-pay | Admitting: Family Medicine

## 2024-12-07 ENCOUNTER — Other Ambulatory Visit

## 2024-12-07 ENCOUNTER — Other Ambulatory Visit: Payer: Self-pay

## 2024-12-07 VITALS — BP 137/98 | HR 83 | Ht 64.0 in | Wt 215.8 lb

## 2024-12-07 DIAGNOSIS — O3680X0 Pregnancy with inconclusive fetal viability, not applicable or unspecified: Secondary | ICD-10-CM

## 2024-12-07 DIAGNOSIS — Z3A00 Weeks of gestation of pregnancy not specified: Secondary | ICD-10-CM | POA: Diagnosis not present

## 2024-12-07 LAB — BETA HCG QUANT (REF LAB): hCG Quant: 1398 m[IU]/mL

## 2024-12-07 NOTE — Progress Notes (Signed)
 Here for stat bhcg. Denies pain. Denies bleeding. She reports she only spotted for 2 days before she went to hospital. States she went to hospital because she was still cramping.  Explained we will draw stat bhcg and have her leave office. She will be called with results in several hours after results received and reviewed by provider. She voices understanding.   [facility] PEAK  Beta HCG Follow-up Visit  Alexis Richardson presents to [facility] for follow-up beta HCG lab. She was seen in MAU for abdominal pain and spotting on 12/04/24.  Beta HCG results:  12/04/24  364   12/07/24  pending        This RN spoke with Dr. Eldonna, she will review STAT beta results and advise patient.   Waddell, RN

## 2024-12-10 DIAGNOSIS — O3680X0 Pregnancy with inconclusive fetal viability, not applicable or unspecified: Secondary | ICD-10-CM

## 2024-12-14 ENCOUNTER — Telehealth: Payer: Self-pay

## 2024-12-14 NOTE — Telephone Encounter (Signed)
 Patient left voicemail stating she recently had a cold but is still dealing with residual congestion.  She is wanting to know what medications would be safe to take during pregnancy, she'd like a call back or message response.    Waddell, RN

## 2024-12-24 ENCOUNTER — Ambulatory Visit

## 2024-12-24 ENCOUNTER — Other Ambulatory Visit: Payer: Self-pay | Admitting: Family Medicine

## 2024-12-24 ENCOUNTER — Other Ambulatory Visit: Payer: Self-pay

## 2024-12-24 DIAGNOSIS — Z3491 Encounter for supervision of normal pregnancy, unspecified, first trimester: Secondary | ICD-10-CM

## 2024-12-24 DIAGNOSIS — Z3A01 Less than 8 weeks gestation of pregnancy: Secondary | ICD-10-CM | POA: Diagnosis not present

## 2024-12-24 DIAGNOSIS — O3680X0 Pregnancy with inconclusive fetal viability, not applicable or unspecified: Secondary | ICD-10-CM

## 2025-01-15 ENCOUNTER — Telehealth: Payer: Self-pay

## 2025-01-21 ENCOUNTER — Encounter: Payer: Self-pay | Admitting: Certified Nurse Midwife
